# Patient Record
Sex: Female | Born: 1965 | Race: Black or African American | Hispanic: No | Marital: Single | Smoking: Never smoker
Health system: Southern US, Community
[De-identification: ages and names within clinical notes are randomized; demographics above are authoritative.]

## PROBLEM LIST (undated history)

## (undated) DIAGNOSIS — L0293 Carbuncle, unspecified: Secondary | ICD-10-CM

## (undated) DIAGNOSIS — G4730 Sleep apnea, unspecified: Secondary | ICD-10-CM

## (undated) DIAGNOSIS — F329 Major depressive disorder, single episode, unspecified: Secondary | ICD-10-CM

## (undated) DIAGNOSIS — E669 Obesity, unspecified: Secondary | ICD-10-CM

## (undated) DIAGNOSIS — F32A Depression, unspecified: Secondary | ICD-10-CM

## (undated) DIAGNOSIS — R569 Unspecified convulsions: Secondary | ICD-10-CM

## (undated) DIAGNOSIS — T7840XA Allergy, unspecified, initial encounter: Secondary | ICD-10-CM

## (undated) DIAGNOSIS — D259 Leiomyoma of uterus, unspecified: Secondary | ICD-10-CM

## (undated) DIAGNOSIS — Z5189 Encounter for other specified aftercare: Secondary | ICD-10-CM

## (undated) DIAGNOSIS — M810 Age-related osteoporosis without current pathological fracture: Secondary | ICD-10-CM

## (undated) DIAGNOSIS — K219 Gastro-esophageal reflux disease without esophagitis: Secondary | ICD-10-CM

## (undated) DIAGNOSIS — I10 Essential (primary) hypertension: Secondary | ICD-10-CM

## (undated) DIAGNOSIS — E7800 Pure hypercholesterolemia, unspecified: Secondary | ICD-10-CM

## (undated) DIAGNOSIS — L0292 Furuncle, unspecified: Secondary | ICD-10-CM

## (undated) DIAGNOSIS — F419 Anxiety disorder, unspecified: Secondary | ICD-10-CM

## (undated) DIAGNOSIS — J45909 Unspecified asthma, uncomplicated: Secondary | ICD-10-CM

## (undated) DIAGNOSIS — M1990 Unspecified osteoarthritis, unspecified site: Secondary | ICD-10-CM

## (undated) HISTORY — DX: Carbuncle, unspecified: L02.93

## (undated) HISTORY — DX: Gastro-esophageal reflux disease without esophagitis: K21.9

## (undated) HISTORY — DX: Furuncle, unspecified: L02.92

## (undated) HISTORY — PX: KNEE SURGERY: SHX244

## (undated) HISTORY — PX: UPPER GASTROINTESTINAL ENDOSCOPY: SHX188

## (undated) HISTORY — DX: Sleep apnea, unspecified: G47.30

## (undated) HISTORY — PX: ABDOMINAL HYSTERECTOMY: SHX81

## (undated) HISTORY — DX: Allergy, unspecified, initial encounter: T78.40XA

## (undated) HISTORY — DX: Encounter for other specified aftercare: Z51.89

## (undated) HISTORY — DX: Age-related osteoporosis without current pathological fracture: M81.0

---

## 2016-04-09 ENCOUNTER — Encounter (HOSPITAL_COMMUNITY): Payer: Self-pay | Admitting: *Deleted

## 2016-04-09 ENCOUNTER — Emergency Department (HOSPITAL_COMMUNITY)
Admission: EM | Admit: 2016-04-09 | Discharge: 2016-04-09 | Disposition: A | Discharge status: Home / Self Care | Payer: Medicaid Other | Attending: Emergency Medicine | Admitting: Emergency Medicine

## 2016-04-09 DIAGNOSIS — J45909 Unspecified asthma, uncomplicated: Secondary | ICD-10-CM | POA: Diagnosis not present

## 2016-04-09 DIAGNOSIS — I10 Essential (primary) hypertension: Secondary | ICD-10-CM | POA: Insufficient documentation

## 2016-04-09 DIAGNOSIS — N644 Mastodynia: Secondary | ICD-10-CM | POA: Insufficient documentation

## 2016-04-09 DIAGNOSIS — R21 Rash and other nonspecific skin eruption: Secondary | ICD-10-CM | POA: Diagnosis not present

## 2016-04-09 HISTORY — DX: Depression, unspecified: F32.A

## 2016-04-09 HISTORY — DX: Obesity, unspecified: E66.9

## 2016-04-09 HISTORY — DX: Unspecified asthma, uncomplicated: J45.909

## 2016-04-09 HISTORY — DX: Essential (primary) hypertension: I10

## 2016-04-09 HISTORY — DX: Pure hypercholesterolemia, unspecified: E78.00

## 2016-04-09 HISTORY — DX: Anxiety disorder, unspecified: F41.9

## 2016-04-09 HISTORY — DX: Unspecified convulsions: R56.9

## 2016-04-09 HISTORY — DX: Leiomyoma of uterus, unspecified: D25.9

## 2016-04-09 HISTORY — DX: Major depressive disorder, single episode, unspecified: F32.9

## 2016-04-09 HISTORY — DX: Unspecified osteoarthritis, unspecified site: M19.90

## 2016-04-09 NOTE — Discharge Instructions (Signed)
Please call and follow up with the breast center for breast ultrasound.  Call and follow up with dermatologist for your skin rash.  Continue using your triamcinolone cream as needed but avoid using on the face as it can thin your skin.

## 2016-04-09 NOTE — ED Notes (Signed)
Pt ambulated to bathroom without difficulty.

## 2016-04-09 NOTE — ED Triage Notes (Signed)
Pt reports having rash and black patches to her breasts x 3 weeks. Reports pain to both breasts. Also now has rash to her face and arms/hands with mild itching. Reports possible fevers. No acute distress noted at triage.

## 2016-04-09 NOTE — ED Notes (Signed)
Pt stable, ambulatory, states understanding of discharge instructions 

## 2016-04-09 NOTE — ED Provider Notes (Signed)
[facility] DEPT Provider Note   CSN: [id number] Arrival date & time: 04/09/16  1629     History   Chief Complaint Chief Complaint  Patient presents with  . Rash  . Breast Pain    HPI Heather Perry is a 50 y.o. female.  HPI   50 year old obese female with history of asthma, anxiety, hypertension presenting for evaluation of a rash. Patient reports for the past 2 weeks she has had bilateral  breast discomfort as well as rash to her for the past 3 or 4 days it has spread towards her neck and face. No associated nipple discharge, fever, abnormal weight changes. She has been using triamcinolone cream for the past 2 days with minimal improvement. She endorse having congestion, sinus headache which is not new. She recently moved from [place] to here to be with her daughter. Rash is not itchy and nontender. No associated fever, neck stiffness, chest pain, difficulty breathing, numbness or weakness.  Past Medical History:  Diagnosis Date  . Anxiety   . Arthritis   . Asthma   . Depression   . High cholesterol   . Hypertension   . Obesity   . Seizures ([place])   . Uterine fibroid     There are no active problems to display for this patient.   Past Surgical History:  Procedure Laterality Date  . ABDOMINAL HYSTERECTOMY      OB History    No data available       Home Medications    Prior to Admission medications   Not on File    Family History History reviewed. No pertinent family history.  Social History Social History  Substance Use Topics  . Smoking status: Never Smoker  . Smokeless tobacco: Not on file  . Alcohol use No     Allergies   Lortab [hydrocodone-acetaminophen] and Penicillins   Review of Systems Review of Systems  All other systems reviewed and are negative.    Physical Exam Updated Vital Signs BP 132/71 (BP Location: Right Arm)   Pulse 87   Temp 97.5 F (36.4 C) (Oral)   Resp 18   Ht 5\' 2"  (1.575 m)   SpO2 99%   Physical Exam   Constitutional: She appears well-developed and well-nourished. No distress.  Obese female sitting in bed in no acute discomfort.  HENT:  Head: Atraumatic.  Eyes: Conjunctivae are normal.  Neck: Neck supple.  Cardiovascular: Normal rate and regular rhythm.   Pulmonary/Chest: Effort normal and breath sounds normal.  Abdominal: Soft. Bowel sounds are normal. She exhibits no distension. There is no tenderness.  Neurological: She is alert.  Skin: No rash noted.  Chaperone present during exam. On breast exam, no significant mass or nodule appreciated. Patient has several small less than 2 mm hyperpigmented skin lesion noted on her breast that does not appears to be infected and nontender to palpation. The same skin changes lesions noted on her abdomen in no specific distribution. No evidence of cellulitis or abscess noted. Breasts without peau d'orange, and no nipple inversion. No rash on face or neck.  Psychiatric: She has a normal mood and affect.  Nursing note and vitals reviewed.    ED Treatments / Results  Labs (all labs ordered are listed, but only abnormal results are displayed) Labs Reviewed - No data to display  EKG  EKG Interpretation None       Radiology No results found.  Procedures Procedures (including critical care time)  Medications Ordered in ED Medications -  No data to display   Initial Impression / Assessment and Plan / ED Course  I have reviewed the triage vital signs and the nursing notes.  Pertinent labs & imaging results that were available during my care of the patient were reviewed by me and considered in my medical decision making (see chart for details).  Clinical Course    BP 132/71 (BP Location: Right Arm)   Pulse 87   Temp 97.5 F (36.4 C) (Oral)   Resp 18   Ht 5\' 2"  (1.575 m)   SpO2 99%    Final Clinical Impressions(s) / ED Diagnoses   Final diagnoses:  Breast pain  Rash and nonspecific skin eruption    New Prescriptions New  Prescriptions   No medications on file   Patient presents concerning for bilateral breast discomfort for several weeks as well as a non-itchy nontender skin rash to her body. Her breast exam is unremarkable however, plan to refer patient to the breast Center for breast ultrasound for further evaluation. She does have several small localized skin hyperpigmentation skin changes. Suspect old insect bite without evidence of cellulitis or abscess. No evidence to suggest drug eruption, and no concerning feature for rash. Reassurance given. Dermatology referral given as needed. Recommend follow-up with primary care provider for further care.   Domenic Moras, PA-C 04/09/16 1917    Gareth Morgan, MD 04/11/16 1352

## 2016-04-25 ENCOUNTER — Encounter: Payer: Self-pay | Admitting: Gastroenterology

## 2016-04-25 ENCOUNTER — Encounter: Payer: Self-pay | Admitting: Family Medicine

## 2016-04-25 ENCOUNTER — Ambulatory Visit (INDEPENDENT_AMBULATORY_CARE_PROVIDER_SITE_OTHER): Payer: Medicaid Other | Admitting: Family Medicine

## 2016-04-25 VITALS — BP 122/74 | HR 66 | Temp 97.8°F | Resp 14 | Ht 62.0 in | Wt 229.0 lb

## 2016-04-25 DIAGNOSIS — E669 Obesity, unspecified: Secondary | ICD-10-CM

## 2016-04-25 DIAGNOSIS — I10 Essential (primary) hypertension: Secondary | ICD-10-CM | POA: Diagnosis not present

## 2016-04-25 DIAGNOSIS — Z131 Encounter for screening for diabetes mellitus: Secondary | ICD-10-CM

## 2016-04-25 DIAGNOSIS — R21 Rash and other nonspecific skin eruption: Secondary | ICD-10-CM

## 2016-04-25 DIAGNOSIS — Z1211 Encounter for screening for malignant neoplasm of colon: Secondary | ICD-10-CM

## 2016-04-25 LAB — CBC WITH DIFFERENTIAL/PLATELET
BASOS ABS: 0 {cells}/uL (ref 0–200)
Basophils Relative: 0 %
EOS PCT: 2 %
Eosinophils Absolute: 100 cells/uL (ref 15–500)
HCT: 42.2 % (ref 35.0–45.0)
Hemoglobin: 14.1 g/dL (ref 11.7–15.5)
LYMPHS PCT: 35 %
Lymphs Abs: 1750 cells/uL (ref 850–3900)
MCH: 30.5 pg (ref 27.0–33.0)
MCHC: 33.4 g/dL (ref 32.0–36.0)
MCV: 91.1 fL (ref 80.0–100.0)
MONOS PCT: 6 %
MPV: 11.2 fL (ref 7.5–12.5)
Monocytes Absolute: 300 cells/uL (ref 200–950)
NEUTROS PCT: 57 %
Neutro Abs: 2850 cells/uL (ref 1500–7800)
PLATELETS: 178 10*3/uL (ref 140–400)
RBC: 4.63 MIL/uL (ref 3.80–5.10)
RDW: 14.3 % (ref 11.0–15.0)
WBC: 5 10*3/uL (ref 3.8–10.8)

## 2016-04-25 LAB — COMPLETE METABOLIC PANEL WITH GFR
ALBUMIN: 4.6 g/dL (ref 3.6–5.1)
ALK PHOS: 107 U/L (ref 33–130)
ALT: 31 U/L — ABNORMAL HIGH (ref 6–29)
AST: 24 U/L (ref 10–35)
BUN: 20 mg/dL (ref 7–25)
CO2: 26 mmol/L (ref 20–31)
Calcium: 9.7 mg/dL (ref 8.6–10.4)
Chloride: 100 mmol/L (ref 98–110)
Creat: 0.72 mg/dL (ref 0.50–1.05)
GFR, Est African American: >89 mL/min (ref >=60)
GLUCOSE: 104 mg/dL — AB (ref 65–99)
POTASSIUM: 3.9 mmol/L (ref 3.5–5.3)
SODIUM: 137 mmol/L (ref 135–146)
Total Bilirubin: 0.3 mg/dL (ref 0.2–1.2)
Total Protein: 7.5 g/dL (ref 6.1–8.1)

## 2016-04-25 LAB — LIPID PANEL
CHOL/HDL RATIO: 2.2 ratio (ref <5.0)
Cholesterol: 194 mg/dL (ref <200)
HDL: 89 mg/dL (ref >50)
LDL Cholesterol: 85 mg/dL
Triglycerides: 98 mg/dL (ref <150)
VLDL: 20 mg/dL (ref <30)

## 2016-04-25 LAB — HEMOGLOBIN A1C
Hgb A1c MFr Bld: 5.3 % (ref <5.7)
Mean Plasma Glucose: 105 mg/dL

## 2016-04-25 LAB — TSH: TSH: 3.25 m[IU]/L

## 2016-04-25 MED ORDER — PHENYTOIN SODIUM EXTENDED 100 MG PO CAPS: 100.0000 mg | ORAL_CAPSULE | Freq: Three times a day (TID) | ORAL | 5 refills | Status: DC

## 2016-04-25 MED ORDER — ERGOCALCIFEROL 1.25 MG (50000 UT) PO CAPS: 50000.0000 [IU] | ORAL_CAPSULE | ORAL | 1 refills | Status: DC

## 2016-04-25 MED ORDER — TRIAMCINOLONE ACETONIDE 0.1 % EX CREA: 1.0000 "application " | TOPICAL_CREAM | Freq: Two times a day (BID) | CUTANEOUS | 0 refills | Status: DC

## 2016-04-25 MED ORDER — ALBUTEROL SULFATE HFA 108 (90 BASE) MCG/ACT IN AERS: 2.0000 | INHALATION_SPRAY | Freq: Four times a day (QID) | RESPIRATORY_TRACT | 1 refills | Status: DC | PRN

## 2016-04-25 MED ORDER — HYDROCHLOROTHIAZIDE 25 MG PO TABS: 25.0000 mg | ORAL_TABLET | Freq: Every day | ORAL | 1 refills | Status: DC

## 2016-04-25 MED ORDER — POTASSIUM CHLORIDE CRYS ER 20 MEQ PO TBCR: 20.0000 meq | EXTENDED_RELEASE_TABLET | Freq: Two times a day (BID) | ORAL | 5 refills | Status: DC

## 2016-04-25 NOTE — Patient Instructions (Signed)
Bring all the medications that you are taking by for [place] to review.

## 2016-04-25 NOTE — Progress Notes (Signed)
Blood drawn without difficulty, patient tolerated well. Patient would like to wait for Tdap until NP looks into contraindication with seizure history. Patient to bring back bottles of meds for confirmation of what she is taking per Sharon Seller. Patient would like refill of Triamcinolone please.

## 2016-04-25 NOTE — Progress Notes (Signed)
Heather Perry, is a 50 y.o. female  [id number]  [id number]  DOB - 1965-12-26  CC:  Chief Complaint  Patient presents with  . Rash    rash on side of face, neck and back area for 1 month, some itching and irritation  . breast pain    pain in both breast for a few months, like a discomfort last mammogram 1 plus year ago in [place]  . Medication Refill    potassium and HCTZ, ambien  . Hot Flashes    hysterectomy 2017       HPI: Heather Perry is a 50 y.o. female here to establish care. She has a complicated history and is on many medications. Her diagnoses include HTN, asthma, arthritis, hypercholesterolemia, seizures, GERD, anxiety and depression. She brings a medication list, which is extensive and there is a lack of clarity as to what she is actually taking and what she needs refilled. She is to come back in next couple of days and bring her medications for review. She reports needing refills on potassium, Vitamin D, hctz, dilantin and albuterol. She is unsure about the others. She is on oxycodone, valium, ambien and citalopram. We will refer to pain clinic and mental health. She complains today of bilateral breast pain. This has been going on for several lumps. She recently was seen in ED and a breast exam showed no suspicious findings. She also complains of menopausal symptoms. She fairly recently had a hysterectomy but ovaries were reportedly left. She also complains of an intermittent rash and ask for refill of Triamcinolone.   Health Maintenance: she declines Tdap today. Reports having her flu shot for this season. She is due for a mammogram . Has had a hysterectomy for fibroids. She has not had colon cancer screening.    Allergies  Allergen Reactions  . Lortab [Hydrocodone-Acetaminophen]   . Penicillins    Past Medical History:  Diagnosis Date  . Anxiety   . Arthritis   . Asthma   . Depression   . High cholesterol   . Hypertension   . Obesity   . Seizures ([place])    . Uterine fibroid    No current outpatient prescriptions on file prior to visit.   No current facility-administered medications on file prior to visit.    History reviewed. No pertinent family history. Social History   Social History  . Marital status: Single    Spouse name: N/A  . Number of children: N/A  . Years of education: N/A   Occupational History  . Not on file.   Social History Main Topics  . Smoking status: Former Research scientist (life sciences)  . Smokeless tobacco: Never Used  . Alcohol use No  . Drug use: No  . Sexual activity: Not on file   Other Topics Concern  . Not on file   Social History Narrative  . No narrative on file    Review of Systems: Constitutional: + for hot flashes. Skin: + for rash on chest and arms HENT: Negative  Eyes: + for decreased vision Neck: Negative Respiratory: + fpr shortness of breath, coughing, wheezing Cardiovascular: + for palpitation, some swelling of feet and legs. Gastrointestinal: Negative Genitourinary: + for frequency Musculoskeletal: + for low back pain, knee pain  Neurological:+ for seiaure disorder Hematological: Negative  Psychiatric/Behavioral: + for depression and anxiety   Objective:   Vitals:   04/25/16 0814  BP: 122/74  Pulse: 66  Resp: 14  Temp: 97.8 F (36.6 C)    Physical Exam:  Constitutional: Patient appears well-developed and well-nourished. No distress. HENT: Normocephalic, atraumatic, External right and left ear normal. Oropharynx is clear and moist.  Eyes: Conjunctivae and EOM are normal. PERRLA, no scleral icterus. Neck: Normal ROM. Neck supple. No lymphadenopathy, No thyromegaly. CVS: RRR, S1/S2 +, no murmurs, no gallops, no rubs Pulmonary: Effort and breath sounds normal, no stridor, rhonchi, wheezes, rales.  Abdominal: Soft. Normoactive BS,, no distension, tenderness, rebound or guarding.  Musculoskeletal: Normal range of motion. No edema and no tenderness.  Neuro: Alert.Normal muscle tone  coordination. Non-focal Skin: Skin is warm and dry. No rash noted. Not diaphoretic. No erythema. No pallor. Psychiatric: Normal mood and affect. Behavior. Seems a little vague about her conditions and medications  Lab Results  Component Value Date   WBC 5.0 04/25/2016   HGB 14.1 04/25/2016   HCT 42.2 04/25/2016   MCV 91.1 04/25/2016   PLT 178 04/25/2016   Lab Results  Component Value Date   CREATININE 0.72 04/25/2016   BUN 20 04/25/2016   NA 137 04/25/2016   K 3.9 04/25/2016   CL 100 04/25/2016   CO2 26 04/25/2016    Lab Results  Component Value Date   HGBA1C 5.3 04/25/2016   Lipid Panel     Component Value Date/Time   CHOL 194 04/25/2016 0857   TRIG 98 04/25/2016 0857   HDL 89 04/25/2016 0857   CHOLHDL 2.2 04/25/2016 0857   VLDL 20 04/25/2016 0857   LDLCALC 85 04/25/2016 0857        Assessment and plan:   1. Screening for diabetes mellitus  - Hemoglobin A1c  2. Essential hypertension  - CBC with Differential - COMPLETE METABOLIC PANEL WITH GFR - Lipid panel  3. Obesity, unspecified classification, unspecified obesity type, unspecified whether serious comorbidity present  - TSH  4. Special screening for malignant neoplasms, colon  - Ambulatory referral to Gastroenterology  5. Rash and nonspecific skin eruption  - triamcinolone cream (KENALOG) 0.1 %; Apply 1 application topically 2 (two) times daily.  Dispense: 30 g; Refill: 0   Return in about 3 months (around 07/26/2016).  The patient was given clear instructions to go to ER or return to medical center if symptoms don't improve, worsen or new problems develop. The patient verbalized understanding.    Micheline Chapman FNP  04/26/2016, 4:24 PM

## 2016-04-29 ENCOUNTER — Other Ambulatory Visit: Payer: Self-pay | Admitting: Family Medicine

## 2016-04-29 DIAGNOSIS — N644 Mastodynia: Secondary | ICD-10-CM

## 2016-05-04 ENCOUNTER — Other Ambulatory Visit: Payer: Self-pay | Admitting: Family Medicine

## 2016-05-07 ENCOUNTER — Emergency Department (HOSPITAL_COMMUNITY)
Admission: EM | Admit: 2016-05-07 | Discharge: 2016-05-07 | Disposition: A | Discharge status: Home / Self Care | Payer: Medicaid Other | Attending: Dermatology | Admitting: Dermatology

## 2016-05-07 ENCOUNTER — Encounter (HOSPITAL_COMMUNITY): Payer: Self-pay | Admitting: Emergency Medicine

## 2016-05-07 DIAGNOSIS — I10 Essential (primary) hypertension: Secondary | ICD-10-CM | POA: Diagnosis not present

## 2016-05-07 DIAGNOSIS — J45909 Unspecified asthma, uncomplicated: Secondary | ICD-10-CM | POA: Diagnosis not present

## 2016-05-07 DIAGNOSIS — Z5321 Procedure and treatment not carried out due to patient leaving prior to being seen by health care provider: Secondary | ICD-10-CM | POA: Insufficient documentation

## 2016-05-07 DIAGNOSIS — Z7982 Long term (current) use of aspirin: Secondary | ICD-10-CM | POA: Diagnosis not present

## 2016-05-07 DIAGNOSIS — R531 Weakness: Secondary | ICD-10-CM | POA: Diagnosis present

## 2016-05-07 DIAGNOSIS — Z79899 Other long term (current) drug therapy: Secondary | ICD-10-CM | POA: Diagnosis not present

## 2016-05-07 NOTE — ED Notes (Signed)
Called pt multiple times for room assignment with no answer in lobby

## 2016-05-07 NOTE — ED Triage Notes (Addendum)
Weakness, fatigue, yellow discoloration of fingernails, black patches to skin all over. No acute distress. Denies pain. Reports ongoing for two weeks, has seen PCP on 11/6 and had blood work done.

## 2016-05-07 NOTE — ED Notes (Signed)
Pt unhappy with experience and states that she is leaving. Informed charge Roselyn Reef, Therapist, sports. Pt states that multiple people have gone back before her, I explained that pts go to difference areas within department and radiology, triage etc. Pt would not listen to this RN. Pt given patient experience phone #.

## 2016-05-07 NOTE — ED Notes (Signed)
Called pt again multiple times and she is not in lobby

## 2016-05-07 NOTE — ED Notes (Signed)
Pt states that her ride will not come to pick her up until after she is seen by MD so therefore is going to stay but states "she will still be filing a complaint"

## 2016-05-17 ENCOUNTER — Other Ambulatory Visit: Payer: Self-pay | Admitting: Family Medicine

## 2016-05-17 DIAGNOSIS — R21 Rash and other nonspecific skin eruption: Secondary | ICD-10-CM

## 2016-05-18 ENCOUNTER — Other Ambulatory Visit: Payer: Self-pay | Admitting: Family Medicine

## 2016-05-18 ENCOUNTER — Telehealth: Payer: Self-pay

## 2016-05-18 DIAGNOSIS — R21 Rash and other nonspecific skin eruption: Secondary | ICD-10-CM

## 2016-05-18 MED ORDER — BUDESONIDE-FORMOTEROL FUMARATE 160-4.5 MCG/ACT IN AERO: 2.0000 | INHALATION_SPRAY | Freq: Two times a day (BID) | RESPIRATORY_TRACT | 1 refills | Status: DC

## 2016-05-18 MED ORDER — PRAVASTATIN SODIUM 20 MG PO TABS: 20.0000 mg | ORAL_TABLET | Freq: Every day | ORAL | 1 refills | Status: DC

## 2016-05-18 MED ORDER — CITALOPRAM HYDROBROMIDE 40 MG PO TABS: 40.0000 mg | ORAL_TABLET | Freq: Every day | ORAL | 1 refills | Status: DC

## 2016-05-18 MED ORDER — HYDROCHLOROTHIAZIDE 25 MG PO TABS: 25.0000 mg | ORAL_TABLET | Freq: Every day | ORAL | 1 refills | Status: DC

## 2016-05-18 MED ORDER — ESOMEPRAZOLE MAGNESIUM 40 MG PO CPDR: 40.0000 mg | DELAYED_RELEASE_CAPSULE | Freq: Every day | ORAL | 1 refills | Status: DC

## 2016-05-18 MED ORDER — ACYCLOVIR 5 % EX OINT: 1.0000 "application " | TOPICAL_OINTMENT | CUTANEOUS | 1 refills | Status: DC

## 2016-05-18 MED ORDER — PHENYTOIN SODIUM EXTENDED 100 MG PO CAPS: 100.0000 mg | ORAL_CAPSULE | Freq: Three times a day (TID) | ORAL | 5 refills | Status: DC

## 2016-05-18 MED ORDER — VITAMIN B-1 100 MG PO TABS
100.0000 mg | ORAL_TABLET | Freq: Every day | ORAL | 1 refills | Status: DC
Start: 2016-05-18 — End: 2017-06-26

## 2016-05-18 MED ORDER — POTASSIUM CHLORIDE CRYS ER 20 MEQ PO TBCR: 20.0000 meq | EXTENDED_RELEASE_TABLET | Freq: Two times a day (BID) | ORAL | 5 refills | Status: DC

## 2016-05-18 MED ORDER — ERGOCALCIFEROL 1.25 MG (50000 UT) PO CAPS: 50000.0000 [IU] | ORAL_CAPSULE | ORAL | 1 refills | Status: DC

## 2016-05-18 MED ORDER — TRIAMCINOLONE ACETONIDE 0.1 % EX CREA: TOPICAL_CREAM | CUTANEOUS | 0 refills | Status: DC

## 2016-05-18 MED ORDER — ASPIRIN 325 MG PO TABS: 325.0000 mg | ORAL_TABLET | Freq: Every day | ORAL | 1 refills | Status: DC

## 2016-05-18 MED ORDER — GABAPENTIN 300 MG PO CAPS: 300.0000 mg | ORAL_CAPSULE | Freq: Three times a day (TID) | ORAL | 1 refills | Status: DC

## 2016-05-18 MED ORDER — CELECOXIB 200 MG PO CAPS: 200.0000 mg | ORAL_CAPSULE | Freq: Two times a day (BID) | ORAL | 1 refills | Status: DC

## 2016-05-18 MED ORDER — IPRATROPIUM-ALBUTEROL 0.5-2.5 (3) MG/3ML IN SOLN
3.0000 mL | Freq: Four times a day (QID) | RESPIRATORY_TRACT | 1 refills | Status: DC | PRN
Start: 2016-05-18 — End: 2023-06-02

## 2016-05-18 MED ORDER — ALBUTEROL SULFATE HFA 108 (90 BASE) MCG/ACT IN AERS: 2.0000 | INHALATION_SPRAY | Freq: Four times a day (QID) | RESPIRATORY_TRACT | 1 refills | Status: DC | PRN

## 2016-05-18 NOTE — Telephone Encounter (Signed)
Spoke with Jennye Boroughs. She will fax a list of patients refills needed from [facility] in order for patients insurance to pay for them. Thanks!

## 2016-05-19 ENCOUNTER — Ambulatory Visit
Admission: RE | Admit: 2016-05-19 | Discharge: 2016-05-19 | Disposition: A | Discharge status: Home / Self Care | Payer: Medicaid Other | Source: Ambulatory Visit | Attending: Family Medicine | Admitting: Family Medicine

## 2016-05-19 DIAGNOSIS — N644 Mastodynia: Secondary | ICD-10-CM

## 2016-05-24 ENCOUNTER — Other Ambulatory Visit: Payer: Self-pay | Admitting: *Deleted

## 2016-05-24 NOTE — Telephone Encounter (Signed)
Patient verified DOB Patient called to request a refill on Oxycodone. Patient informed of office not prescribing controlled medications and patient will have to obtain medication from a pain clinic. Patient is aware of referral being placed and in the mean time patient was provided the Urgent Care contact information. Patient expressed her understanding and had no further questions at this time.

## 2016-05-30 ENCOUNTER — Other Ambulatory Visit: Payer: Self-pay | Admitting: Family Medicine

## 2016-05-30 MED ORDER — CITALOPRAM HYDROBROMIDE 40 MG PO TABS: 40.0000 mg | ORAL_TABLET | Freq: Every day | ORAL | 1 refills | Status: DC

## 2016-06-08 ENCOUNTER — Ambulatory Visit: Payer: Medicaid Other | Admitting: Family Medicine

## 2016-06-16 ENCOUNTER — Ambulatory Visit (AMBULATORY_SURGERY_CENTER): Payer: Self-pay | Admitting: *Deleted

## 2016-06-16 VITALS — Ht 62.0 in | Wt 221.0 lb

## 2016-06-16 DIAGNOSIS — Z1211 Encounter for screening for malignant neoplasm of colon: Secondary | ICD-10-CM

## 2016-06-16 MED ORDER — NA SULFATE-K SULFATE-MG SULF 17.5-3.13-1.6 GM/177ML PO SOLN: ORAL | 0 refills | Status: DC

## 2016-06-16 NOTE — Progress Notes (Signed)
No egg or soy allergy  No anesthesia or intubation problems per pt  No diet medications taken  No home oxygen used; hx of sleep apnea- no CPAP used  Pt has hx of seizures- last one "three years ago" per pt.  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

## 2016-06-30 ENCOUNTER — Encounter: Payer: Self-pay | Admitting: Gastroenterology

## 2016-06-30 ENCOUNTER — Ambulatory Visit (AMBULATORY_SURGERY_CENTER): Payer: Medicaid Other | Admitting: Gastroenterology

## 2016-06-30 VITALS — BP 124/88 | HR 65 | Temp 96.9°F | Resp 15 | Ht 62.0 in | Wt 221.0 lb

## 2016-06-30 DIAGNOSIS — Z1212 Encounter for screening for malignant neoplasm of rectum: Secondary | ICD-10-CM | POA: Diagnosis not present

## 2016-06-30 DIAGNOSIS — Z1211 Encounter for screening for malignant neoplasm of colon: Secondary | ICD-10-CM | POA: Diagnosis not present

## 2016-06-30 MED ORDER — SODIUM CHLORIDE 0.9 % IV SOLN: 500.0000 mL | INTRAVENOUS | Status: DC

## 2016-06-30 NOTE — Op Note (Addendum)
[facility] Patient Name: Heather Perry Procedure Date: 06/30/2016 11:11 AM MRN: [id number] Endoscopist: Mauri Pole , MD Age: 51 Referring MD:  Date of Birth: 1966-03-15 Gender: Female Account #: [id number] Procedure:                Colonoscopy Indications:              Screening for colorectal malignant neoplasm, This                            is the patient's first colonoscopy Medicines:                Monitored Anesthesia Care Procedure:                Pre-Anesthesia Assessment:                           - Prior to the procedure, a History and Physical                            was performed, and patient medications and                            allergies were reviewed. The patient's tolerance of                            previous anesthesia was also reviewed. The risks                            and benefits of the procedure and the sedation                            options and risks were discussed with the patient.                            All questions were answered, and informed consent                            was obtained. Prior Anticoagulants: The patient has                            taken no previous anticoagulant or antiplatelet                            agents. ASA Grade Assessment: II - A patient with                            mild systemic disease. After reviewing the risks                            and benefits, the patient was deemed in                            satisfactory condition to undergo the procedure.  After obtaining informed consent, the colonoscope                            was passed under direct vision. Throughout the                            procedure, the patient's blood pressure, pulse, and                            oxygen saturations were monitored continuously. The                            Colonoscope was introduced through the anus and                            advanced to the the  terminal ileum, with                            identification of the appendiceal orifice and IC                            valve. The colonoscopy was somewhat difficult due                            to restricted mobility of the colon. Successful                            completion of the procedure was aided by applying                            abdominal pressure. The patient tolerated the                            procedure well. The quality of the bowel                            preparation was good. The terminal ileum, ileocecal                            valve, appendiceal orifice, and rectum were                            photographed. Scope In: 11:16:53 AM Scope Out: [id number] AM Scope Withdrawal Time: 0 hours 9 minutes 11 seconds  Total Procedure Duration: 0 hours 20 minutes 6 seconds  Findings:                 The perianal and digital rectal examinations were                            normal.                           Non-bleeding internal hemorrhoids were found during  retroflexion. The hemorrhoids were small.                           The exam was otherwise without abnormality. Complications:            No immediate complications. Estimated Blood Loss:     Estimated blood loss: none. Impression:               - Non-bleeding internal hemorrhoids.                           - The examination was otherwise normal.                           - No specimens collected. Recommendation:           - Patient has a contact number available for                            emergencies. The signs and symptoms of potential                            delayed complications were discussed with the                            patient. Return to normal activities tomorrow.                            Written discharge instructions were provided to the                            patient.                           - Resume previous diet.                           -  Continue present medications.                           - Repeat colonoscopy in 10 years for screening                            purposes.                           - Return to GI clinic PRN. Mauri Pole, MD 06/30/2016 11:41:58 AM This report has been signed electronically.

## 2016-06-30 NOTE — Progress Notes (Signed)
Report to PACU, RN, vss, BBS= Clear.  

## 2016-06-30 NOTE — Patient Instructions (Signed)
YOU HAD AN ENDOSCOPIC PROCEDURE TODAY AT [facility]:   Refer to the procedure report that was given to you for any specific questions about what was found during the examination.  If the procedure report does not answer your questions, please call your gastroenterologist to clarify.  If you requested that your care partner not be given the details of your procedure findings, then the procedure report has been included in a sealed envelope for you to review at your convenience later.  YOU SHOULD EXPECT: Some feelings of bloating in the abdomen. Passage of more gas than usual.  Walking can help get rid of the air that was put into your GI tract during the procedure and reduce the bloating. If you had a lower endoscopy (such as a colonoscopy or flexible sigmoidoscopy) you may notice spotting of blood in your stool or on the toilet paper. If you underwent a bowel prep for your procedure, you may not have a normal bowel movement for a few days.  Please Note:  You might notice some irritation and congestion in your nose or some drainage.  This is from the oxygen used during your procedure.  There is no need for concern and it should clear up in a day or so.  SYMPTOMS TO REPORT IMMEDIATELY:   Following lower endoscopy (colonoscopy or flexible sigmoidoscopy):  Excessive amounts of blood in the stool  Significant tenderness or worsening of abdominal pains  Swelling of the abdomen that is new, acute  Fever of 100F or higher  For urgent or emergent issues, a gastroenterologist can be reached at any hour by calling [phone].   DIET:  We do recommend a small meal at first, but then you may proceed to your regular diet.  Drink plenty of fluids but you should avoid alcoholic beverages for 24 hours.  ACTIVITY:  You should plan to take it easy for the rest of today and you should NOT DRIVE or use heavy machinery until tomorrow (because of the sedation medicines used during the test).     FOLLOW UP: Our staff will call the number listed on your records the next business day following your procedure to check on you and address any questions or concerns that you may have regarding the information given to you following your procedure. If we do not reach you, we will leave a message.  However, if you are feeling well and you are not experiencing any problems, there is no need to return our call.  We will assume that you have returned to your regular daily activities without incident.  Repeat Colonoscopy in 10 years Hemorrhoids (handout given)  If any biopsies were taken you will be contacted by phone or by letter within the next 1-3 weeks.  Please call us at [phone] if you have not heard about the biopsies in 3 weeks.    SIGNATURES/CONFIDENTIALITY: You and/or your care partner have signed paperwork which will be entered into your electronic medical record.  These signatures attest to the fact that that the information above on your After Visit Summary has been reviewed and is understood.  Full responsibility of the confidentiality of this discharge information lies with you and/or your care-partner.

## 2016-07-01 ENCOUNTER — Telehealth: Payer: Self-pay

## 2016-07-01 NOTE — Telephone Encounter (Signed)
  Follow up Call-  Call back number 06/30/2016  Post procedure Call Back phone  # [phone]  Permission to leave phone message No     Patient questions:  Do you have a fever, pain , or abdominal swelling? No. Pain Score  0 *  Have you tolerated food without any problems? Yes.    Have you been able to return to your normal activities? Yes.    Do you have any questions about your discharge instructions: Diet   No. Medications  No. Follow up visit  No.  Do you have questions or concerns about your Care? No.  Actions: * If pain score is 4 or above: No action needed, pain <4.

## 2016-07-05 ENCOUNTER — Other Ambulatory Visit: Payer: Self-pay | Admitting: Family Medicine

## 2016-07-05 DIAGNOSIS — R21 Rash and other nonspecific skin eruption: Secondary | ICD-10-CM

## 2016-07-12 ENCOUNTER — Ambulatory Visit: Payer: Medicaid Other | Admitting: Family Medicine

## 2016-08-24 ENCOUNTER — Other Ambulatory Visit: Payer: Self-pay | Admitting: Family Medicine

## 2016-08-24 DIAGNOSIS — R21 Rash and other nonspecific skin eruption: Secondary | ICD-10-CM

## 2016-09-09 ENCOUNTER — Other Ambulatory Visit: Payer: Self-pay | Admitting: Family Medicine

## 2016-10-04 ENCOUNTER — Ambulatory Visit (INDEPENDENT_AMBULATORY_CARE_PROVIDER_SITE_OTHER): Payer: Medicaid Other | Admitting: Physician Assistant

## 2016-10-11 ENCOUNTER — Ambulatory Visit (INDEPENDENT_AMBULATORY_CARE_PROVIDER_SITE_OTHER): Payer: Medicaid Other | Admitting: Physician Assistant

## 2016-10-11 ENCOUNTER — Encounter (INDEPENDENT_AMBULATORY_CARE_PROVIDER_SITE_OTHER): Payer: Self-pay

## 2016-10-18 ENCOUNTER — Encounter (INDEPENDENT_AMBULATORY_CARE_PROVIDER_SITE_OTHER): Payer: Self-pay

## 2016-10-18 ENCOUNTER — Ambulatory Visit (INDEPENDENT_AMBULATORY_CARE_PROVIDER_SITE_OTHER): Payer: Medicaid Other | Admitting: Physician Assistant

## 2016-11-24 ENCOUNTER — Ambulatory Visit: Payer: Medicaid Other | Admitting: Family Medicine

## 2016-12-12 ENCOUNTER — Encounter (INDEPENDENT_AMBULATORY_CARE_PROVIDER_SITE_OTHER): Payer: Self-pay | Admitting: Physician Assistant

## 2016-12-12 ENCOUNTER — Telehealth (INDEPENDENT_AMBULATORY_CARE_PROVIDER_SITE_OTHER): Payer: Self-pay | Admitting: Physician Assistant

## 2016-12-12 ENCOUNTER — Ambulatory Visit (INDEPENDENT_AMBULATORY_CARE_PROVIDER_SITE_OTHER): Payer: Medicaid Other | Admitting: Physician Assistant

## 2016-12-12 VITALS — BP 117/69 | HR 76 | Temp 97.8°F | Ht 63.5 in | Wt 216.6 lb

## 2016-12-12 DIAGNOSIS — L0202 Furuncle of face: Secondary | ICD-10-CM

## 2016-12-12 DIAGNOSIS — G8929 Other chronic pain: Secondary | ICD-10-CM | POA: Diagnosis not present

## 2016-12-12 DIAGNOSIS — M545 Low back pain, unspecified: Secondary | ICD-10-CM

## 2016-12-12 DIAGNOSIS — N611 Abscess of the breast and nipple: Secondary | ICD-10-CM | POA: Diagnosis not present

## 2016-12-12 MED ORDER — CHLORHEXIDINE GLUCONATE 4 % EX LIQD: Freq: Every day | CUTANEOUS | 0 refills | Status: DC | PRN

## 2016-12-12 MED ORDER — SULFAMETHOXAZOLE-TRIMETHOPRIM 800-160 MG PO TABS: 1.0000 | ORAL_TABLET | Freq: Two times a day (BID) | ORAL | 0 refills | Status: DC

## 2016-12-12 NOTE — Patient Instructions (Signed)
Staphylococcal Infection Staphylococcus aureus, also known as staph, is a bacteria that can cause infections. The most common type of staph infection is a skin infection. Staph can spread easily from one person to another. How is this diagnosed? Your health care provider may diagnose and treat a staph infection based on an exam. He or she may also do a culture from the affected area:  To find out exactly what type of bacteria is causing your infection.  To decide which medicine is best to treat it.  How is this treated? Most staph infections can be easily treated with antibiotic medicine. Some infections must be drained. More serious types of staph, including methicillin-resistant Staphylococcus aureus (MRSA) may be more difficult to treat. They may require a strong antibiotic or more than one antibiotic. Follow these instructions at home:  Take your antibiotic as directed by your health care provider. Finish your antibiotic even if you start to feel better.  Keep all follow-up visits as directed by your health care provider.  Tell all of your health care providers including dentists that you have a staph infection, especially if you have been diagnosed with MRSA.  Ask your health care provider when it is safe for you to return to school or work.  To prevent spreading the infection: ? Keep infections and pus or drainage material covered with clean, dry bandages (dressings), or as directed by your health care provider. ? Wash your hands with soap and water often, especially after changing dressings or touching your infection. ? Advise your family and other close contacts to wash their hands frequently with soap and water. They should always wash their hands after changing your dressings, touching the infected area, or touching infected materials. ? Avoid sharing personal items that may have had contact with your infection. These include towels, washcloths, razors, clothing, and  uniforms. ? Wash your linens and clothes with hot water and laundry detergent. Drying clothes in a hot dryer-rather than air-drying-also helps to kill bacteria in clothes. Contact a health care provider if: Although they are usually easy to treat, some staph infections can become very serious. You should contact your health care provider if your infection gets worse or if you have a fever. This information is not intended to replace advice given to you by your health care provider. Make sure you discuss any questions you have with your health care provider. Document Released: 06/27/2014 Document Revised: 11/12/2015 Document Reviewed: 03/11/2014 Elsevier Interactive Patient Education  [employer].

## 2016-12-12 NOTE — Telephone Encounter (Signed)
Patient called stated she has the same spots/rash on her stomach. Stated Heather Fail PA saw the spots/rash this morning but she did not show him the spots on her stomach. Patient thinks it is bug bits but would like to know what Heather Fail PA thinks regarding spots on stomach.  Please follow up with patient.

## 2016-12-12 NOTE — Progress Notes (Signed)
Subjective:  Patient ID: Heather Perry, female    DOB: Aug 29, 1965  Age: 51 y.o. MRN: [id number]  CC: breakouts on face and body  HPI Heather Perry is a 51 y.o. female with a PMH of Anxiety, Depression, HTN, Asthma, OSA, Seizures, Osteoporosis, Obesity, and GERD presents with concern of dark spots on body to include the torso, under breasts, arms, and right cheek. Lesion on right cheek is the most recent. Says the lesion self drained its pustular contents. Other lesions on body have dried out and are no longer swollen, red, or painful. However, there has remained a dark spot in the area of previous inflammation. Requests a dermatology referral.     Patient complains of LBP pain for approximately 4-5  years. Says she has a disc pushing on nerves. Previously went to pain clinic and would like to go back. Says pain is so severe that she can not get out of bed or from chair. Pain is nonradiating and currently a 5/10 because she is taking Roxicet. Has been receiving medications from her PCP in [place]. Does not have any records for [place] to review but says she can bring records or have them faxed to [place] from [place]. Does not endorse saddle paresthesia, urinary dysfunction, or bowel dysfunction.      Outpatient Medications Prior to Visit  Medication Sig Dispense Refill  . albuterol (PROVENTIL HFA;VENTOLIN HFA) 108 (90 Base) MCG/ACT inhaler Inhale 2 puffs into the lungs every 6 (six) hours as needed for wheezing or shortness of breath. 1 Inhaler 1  . aspirin 325 MG EC tablet TAKE 1 TABLET (325 MG TOTAL) BY MOUTH DAILY. 90 tablet 1  . budesonide-formoterol (SYMBICORT) 160-4.5 MCG/ACT inhaler Inhale 2 puffs into the lungs 2 (two) times daily. 1 Inhaler 1  . celecoxib (CELEBREX) 200 MG capsule Take 1 capsule (200 mg total) by mouth 2 (two) times daily. 90 capsule 1  . citalopram (CELEXA) 40 MG tablet Take 1 tablet (40 mg total) by mouth daily. 90 tablet 1  . diazepam (VALIUM) 5 MG tablet Take 5 mg by mouth  every 6 (six) hours as needed for anxiety.    . ergocalciferol (VITAMIN D2) 50000 units capsule Take 1 capsule (50,000 Units total) by mouth once a week. 12 capsule 1  . esomeprazole (NEXIUM) 40 MG capsule Take 1 capsule (40 mg total) by mouth daily at 12 noon. 90 capsule 1  . hydrochlorothiazide (HYDRODIURIL) 25 MG tablet Take 1 tablet (25 mg total) by mouth daily. 90 tablet 1  . ipratropium-albuterol (DUONEB) 0.5-2.5 (3) MG/3ML SOLN Take 3 mLs by nebulization every 6 (six) hours as needed. 360 mL 1  . oxyCODONE (ROXICODONE) 15 MG immediate release tablet Take 15 mg by mouth every 4 (four) hours as needed for pain.    . phenytoin (DILANTIN) 100 MG ER capsule Take 1 capsule (100 mg total) by mouth 3 (three) times daily. 90 capsule 5  . potassium chloride SA (K-DUR,KLOR-CON) 20 MEQ tablet Take 1 tablet (20 mEq total) by mouth 2 (two) times daily. 30 tablet 5  . pravastatin (PRAVACHOL) 20 MG tablet Take 1 tablet (20 mg total) by mouth daily. 90 tablet 1  . thiamine (VITAMIN B-1) 100 MG tablet Take 1 tablet (100 mg total) by mouth daily. 90 tablet 1  . triamcinolone cream (KENALOG) 0.1 % APPLY 1 APPLICATION TOPICALLY 2 TIMES DAILY 30 g 0  . acyclovir ointment (ZOVIRAX) 5 % Apply 1 application topically every 3 (three) hours. (Patient not taking: Reported on 12/12/2016)  15 g 1  . cyclobenzaprine (FLEXERIL) 10 MG tablet Take 10 mg by mouth 3 (three) times daily as needed for muscle spasms.    Marland Kitchen gabapentin (NEURONTIN) 300 MG capsule Take 1 capsule (300 mg total) by mouth 3 (three) times daily. (Patient not taking: Reported on 12/12/2016) 270 capsule 1  . thiamine 100 MG tablet Take 100 mg by mouth daily.    Marland Kitchen zolpidem (AMBIEN) 10 MG tablet Take 10 mg by mouth at bedtime as needed for sleep.     Facility-Administered Medications Prior to Visit  Medication Dose Route Frequency Provider Last Rate Last Dose  . 0.9 %  sodium chloride infusion  500 mL Intravenous Continuous Nandigam, Kavitha V, MD          ROS Review of Systems  Constitutional: Negative for chills, fever and malaise/fatigue.  Eyes: Negative for blurred vision.  Respiratory: Negative for shortness of breath.   Cardiovascular: Negative for chest pain and palpitations.  Gastrointestinal: Negative for abdominal pain and nausea.  Genitourinary: Negative for dysuria and hematuria.  Musculoskeletal: Positive for back pain. Negative for joint pain and myalgias.  Skin: Positive for rash.  Neurological: Negative for tingling and headaches.  Psychiatric/Behavioral: Negative for depression. The patient is not nervous/anxious.     Objective:  BP 117/69 (BP Location: Left Arm, Patient Position: Sitting, Cuff Size: Large)   Pulse 76   Temp 97.8 F (36.6 C) (Oral)   Ht 5' 3.5" (1.613 m)   Wt 216 lb 9.6 oz (98.2 kg)   SpO2 99%   BMI 37.77 kg/m   BP/Weight 12/12/2016 06/30/2016 69/67/8938  Systolic BP 101 751 -  Diastolic BP 69 88 -  Wt. (Lbs) 216.6 221 221  BMI 37.77 40.42 40.42      Physical Exam  Constitutional: She is oriented to person, place, and time.  Well developed, obese, NAD, mild slurring of speech  HENT:  Head: Normocephalic and atraumatic.  Eyes: No scleral icterus.  Neck: Normal range of motion. Neck supple.  Cardiovascular: Normal rate, regular rhythm and normal heart sounds.   Pulmonary/Chest: Effort normal and breath sounds normal.  Musculoskeletal: She exhibits no edema.  Mild TTP along the L3-L5 region including the paraspinals.  Neurological: She is alert and oriented to person, place, and time. No cranial nerve deficit. Coordination normal.  Skin: Skin is warm and dry.  Lesions of postinflammatory hyperpigmentation of the right cheek, lower breasts, and upper arms bilaterally  Psychiatric: She has a normal mood and affect. Her behavior is normal. Thought content normal.  Vitals reviewed.    Assessment & Plan:   1. Furuncle of breast - Postinflammatory hyperpigmentation most likely from  staph infection - Begin sulfamethoxazole-trimethoprim (BACTRIM DS) 800-160 MG tablet; Take 1 tablet by mouth 2 (two) times daily.  Dispense: 20 tablet; Refill: 0 - Begin Hibiclens antiseptic wash. Use on body before a shower and wash with antibacterial soap.  2. Furuncle of cheek - Postinflammatory hyperpigmentation most likely from staph infection - Begin sulfamethoxazole-trimethoprim (BACTRIM DS) 800-160 MG tablet; Take 1 tablet by mouth 2 (two) times daily.  Dispense: 20 tablet; Refill: 0 - Begin Hibiclens antiseptic wash. Use on body before a shower and wash with antibacterial soap.  3. Chronic midline low back pain without sciatica - Ambulatory referral to Pain Clinic - DG Lumbar Spine Complete; Future - Patient currently on Roxicet.  Meds ordered this encounter  Medications  . sulfamethoxazole-trimethoprim (BACTRIM DS) 800-160 MG tablet    Sig: Take 1 tablet by mouth  2 (two) times daily.    Dispense:  20 tablet    Refill:  0    Order Specific Question:   Supervising Provider    Answer:   Tresa Garter [id number]  . chlorhexidine (HIBICLENS) 4 % external liquid    Sig: Apply topically daily as needed.    Dispense:  120 mL    Refill:  0    Order Specific Question:   Supervising Provider    Answer:   Tresa Garter [[id number]]    Follow-up: Return in about 4 weeks (around 01/09/2017) for full physical.   Clent Demark PA

## 2016-12-13 NOTE — Telephone Encounter (Signed)
PATIENT INFORMED TO TAKE MEDICATION GIVEN TO HER ON YESTERDAY FOR HER SPOTS. Nat Christen, CMA

## 2016-12-14 ENCOUNTER — Telehealth (INDEPENDENT_AMBULATORY_CARE_PROVIDER_SITE_OTHER): Payer: Self-pay | Admitting: Physician Assistant

## 2016-12-14 NOTE — Telephone Encounter (Signed)
Patient called left voicemail stated was seen yesterday and would like to know if rash is contagious, concerned because she has a baby in her house.  Please follow up with patient.

## 2016-12-15 NOTE — Telephone Encounter (Signed)
Informed patient that per PCP she is not contagious, but he did state for patient to bathe really well and everything will be fine. Nat Christen, CMA

## 2016-12-16 ENCOUNTER — Ambulatory Visit (HOSPITAL_COMMUNITY)
Admission: RE | Admit: 2016-12-16 | Discharge: 2016-12-16 | Disposition: A | Discharge status: Home / Self Care | Payer: Medicaid Other | Source: Ambulatory Visit | Attending: Physician Assistant | Admitting: Physician Assistant

## 2016-12-16 DIAGNOSIS — M47816 Spondylosis without myelopathy or radiculopathy, lumbar region: Secondary | ICD-10-CM | POA: Diagnosis not present

## 2016-12-16 DIAGNOSIS — M545 Low back pain: Secondary | ICD-10-CM | POA: Insufficient documentation

## 2016-12-16 DIAGNOSIS — G8929 Other chronic pain: Secondary | ICD-10-CM | POA: Diagnosis present

## 2016-12-19 ENCOUNTER — Telehealth (INDEPENDENT_AMBULATORY_CARE_PROVIDER_SITE_OTHER): Payer: Self-pay | Admitting: Physician Assistant

## 2016-12-19 NOTE — Telephone Encounter (Signed)
Patient left voicemail would like to get x-ray results.  Please follow up with patient.

## 2016-12-20 ENCOUNTER — Other Ambulatory Visit (INDEPENDENT_AMBULATORY_CARE_PROVIDER_SITE_OTHER): Payer: Self-pay | Admitting: Physician Assistant

## 2016-12-20 DIAGNOSIS — M48061 Spinal stenosis, lumbar region without neurogenic claudication: Secondary | ICD-10-CM

## 2016-12-20 NOTE — Telephone Encounter (Signed)
Patient called left voicemail stated would like a call back regarding her x-ray results.  Please follow up with patient.

## 2016-12-22 NOTE — Telephone Encounter (Signed)
FWD to PCP. Tempestt S Roberts, CMA  

## 2016-12-23 NOTE — Telephone Encounter (Signed)
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 She should have a non-emergent MRI to further assess. I will send an orthopedic referral now.

## 2016-12-23 NOTE — Telephone Encounter (Signed)
Patient has already been notified of results. Nat Christen, CMA

## 2016-12-28 ENCOUNTER — Telehealth (INDEPENDENT_AMBULATORY_CARE_PROVIDER_SITE_OTHER): Payer: Self-pay | Admitting: Physician Assistant

## 2016-12-28 NOTE — Telephone Encounter (Signed)
Pt called requesting xray results to be mailed to her if possible.

## 2016-12-28 NOTE — Telephone Encounter (Signed)
MAILED RESULTS. Nat Christen, CMA

## 2017-01-05 ENCOUNTER — Telehealth (INDEPENDENT_AMBULATORY_CARE_PROVIDER_SITE_OTHER): Payer: Self-pay | Admitting: Physician Assistant

## 2017-01-05 NOTE — Telephone Encounter (Signed)
Sorry, that is not feasible or medically warranted.

## 2017-01-05 NOTE — Telephone Encounter (Signed)
Patient called stated wants MRI of whole body not just back.  Please follow up with patient.

## 2017-01-05 NOTE — Telephone Encounter (Signed)
FWD to PCP. Tempestt S Roberts, CMA  

## 2017-01-06 ENCOUNTER — Other Ambulatory Visit (HOSPITAL_COMMUNITY): Payer: Self-pay | Admitting: Nurse Practitioner

## 2017-01-06 ENCOUNTER — Ambulatory Visit (HOSPITAL_COMMUNITY)
Admission: RE | Admit: 2017-01-06 | Discharge: 2017-01-06 | Disposition: A | Discharge status: Home / Self Care | Payer: Medicaid Other | Source: Ambulatory Visit | Attending: Nurse Practitioner | Admitting: Nurse Practitioner

## 2017-01-06 DIAGNOSIS — J45909 Unspecified asthma, uncomplicated: Secondary | ICD-10-CM | POA: Diagnosis not present

## 2017-01-11 ENCOUNTER — Encounter (INDEPENDENT_AMBULATORY_CARE_PROVIDER_SITE_OTHER): Payer: Self-pay | Admitting: Physician Assistant

## 2017-01-11 ENCOUNTER — Ambulatory Visit (INDEPENDENT_AMBULATORY_CARE_PROVIDER_SITE_OTHER): Payer: Medicaid Other | Admitting: Physician Assistant

## 2017-01-11 VITALS — BP 106/70 | HR 80 | Temp 97.9°F | Ht 63.39 in | Wt 216.8 lb

## 2017-01-11 DIAGNOSIS — M2550 Pain in unspecified joint: Secondary | ICD-10-CM | POA: Diagnosis not present

## 2017-01-11 DIAGNOSIS — M545 Low back pain: Secondary | ICD-10-CM | POA: Diagnosis not present

## 2017-01-11 DIAGNOSIS — N390 Urinary tract infection, site not specified: Secondary | ICD-10-CM | POA: Diagnosis not present

## 2017-01-11 DIAGNOSIS — M791 Myalgia, unspecified site: Secondary | ICD-10-CM

## 2017-01-11 DIAGNOSIS — R8281 Pyuria: Secondary | ICD-10-CM

## 2017-01-11 DIAGNOSIS — Z1159 Encounter for screening for other viral diseases: Secondary | ICD-10-CM | POA: Diagnosis not present

## 2017-01-11 DIAGNOSIS — G8929 Other chronic pain: Secondary | ICD-10-CM

## 2017-01-11 DIAGNOSIS — Z0000 Encounter for general adult medical examination without abnormal findings: Secondary | ICD-10-CM

## 2017-01-11 DIAGNOSIS — L989 Disorder of the skin and subcutaneous tissue, unspecified: Secondary | ICD-10-CM | POA: Diagnosis not present

## 2017-01-11 DIAGNOSIS — Z23 Encounter for immunization: Secondary | ICD-10-CM | POA: Diagnosis not present

## 2017-01-11 DIAGNOSIS — Z114 Encounter for screening for human immunodeficiency virus [HIV]: Secondary | ICD-10-CM

## 2017-01-11 LAB — POCT URINALYSIS DIPSTICK
BILIRUBIN UA: NEGATIVE
Blood, UA: NEGATIVE
GLUCOSE UA: NEGATIVE
Ketones, UA: NEGATIVE
Nitrite, UA: NEGATIVE
PH UA: 6 (ref 5.0–8.0)
Protein, UA: NEGATIVE
Spec Grav, UA: 1.02 (ref 1.010–1.025)
UROBILINOGEN UA: 0.2 U/dL

## 2017-01-11 NOTE — Progress Notes (Signed)
  Subjective:  Patient ID: Heather Perry, female    DOB: [birth year]  Age: 51 y.o. MRN: [id number]  CC: annual exam  HPI  Heather Perry is a 51 y.o. female with a PMH of Anxiety, Depression, HTN, Asthma, OSA, Seizures, Osteoporosis, Obesity, and GERD presents for an annual physical. Continues with pain in the "whole body". Requests an MRI of the whole body. Has skin lesions that persist but has less outbreaks since using chlorhexidine. Back pain is the same and Roxicet has become ineffective. Currently in process of getting into pain clinic. Does not endorse CP, palpitations, SOB, HA, abdominal pain, f/c/n/v, saddle paresthesia, urinary incontinence, fecal incontinence, or GU sxs.    ROS Review of Systems  Constitutional: Negative for chills, fever and malaise/fatigue.  Eyes: Negative for blurred vision.  Respiratory: Negative for shortness of breath.   Cardiovascular: Negative for chest pain and palpitations.  Gastrointestinal: Negative for abdominal pain and nausea.  Genitourinary: Negative for dysuria and hematuria.  Musculoskeletal: Positive for back pain, joint pain and myalgias.  Skin: Positive for rash.  Neurological: Negative for tingling and headaches.  Psychiatric/Behavioral: Negative for depression. The patient is not nervous/anxious.     Objective:  BP 106/70 (BP Location: Left Arm, Patient Position: Sitting, Cuff Size: Large)   Pulse 80   Temp 97.9 F (36.6 C) (Oral)   Ht 5' 3.39" (1.61 m)   Wt 216 lb 12.8 oz (98.3 kg)   SpO2 97%   BMI 37.94 kg/m   BP/Weight 01/11/2017 12/12/2016 6/72/0947  Systolic BP 096 283 662  Diastolic BP 70 69 88  Wt. (Lbs) 216.8 216.6 221  BMI 37.94 37.77 40.42      Physical Exam  Constitutional: She is oriented to person, place, and time.  Well developed, obese, NAD, polite  HENT:  Head: Normocephalic and atraumatic.  Mouth/Throat: No oropharyngeal exudate.  Eyes: Pupils are equal, round, and reactive to light. Conjunctivae and EOM  are normal. No scleral icterus.  Neck: Normal range of motion. Neck supple. No thyromegaly present.  Cardiovascular: Normal rate, regular rhythm and normal heart sounds.   Pulmonary/Chest: Effort normal and breath sounds normal.  Abdominal: Soft. Bowel sounds are normal. She exhibits no distension. There is no tenderness. There is no rebound and no guarding.  Difficult to assess for hepatosplenomegaly due to obese abdomen  Musculoskeletal: She exhibits no edema.  Back ROM not assessed due to pain. UEs and LEs with full aROM.  Lymphadenopathy:    She has no cervical adenopathy.  Neurological: She is alert and oriented to person, place, and time. No cranial nerve deficit. Coordination normal.  Patellar DTR 1+ bilaterally. LE and UE strength 5/5 bilaterally  Skin: Skin is warm and dry. No rash noted. No erythema. No pallor.  Psychiatric: She has a normal mood and affect. Thought content normal.  Odd mannerisms  Vitals reviewed.    Assessment & Plan:   1. Annual physical exam - CBC with Differential - Comprehensive metabolic panel - Lipid Panel - Urinalysis Dipstick with trace leukocytes in clinic today  2. Screening for HIV without presence of risk factors - HIV antibody  3. Need for hepatitis C screening test - Hepatitis C Antibody  4. Need for Tdap vaccination - Administered Tdap in clinic today  5. Myalgia - ANA w/Reflex  6. Arthralgia, unspecified joint - ANA w/Reflex     Follow-up: 8 weeks  Clent Demark PA

## 2017-01-11 NOTE — Patient Instructions (Signed)
Spinal Stenosis Spinal stenosis occurs when the open space (spinal canal) between the bones of your spine (vertebrae) narrows, putting pressure on the spinal cord or nerves. What are the causes? This condition is caused by areas of bone pushing into the central canals of your vertebrae. This condition may be present at birth (congenital), or it may be caused by:  Arthritic deterioration of your vertebrae (spinal degeneration). This usually starts around age 25.  Injury or trauma to the spine.  Tumors in the spine.  Calcium deposits in the spine.  What are the signs or symptoms? Symptoms of this condition include:  Pain in the neck or back that is generally worse with activities, particularly when standing and walking.  Numbness, tingling, hot or cold sensations, weakness, or weariness in your legs.  Pain going up and down the leg (sciatica).  Frequent episodes of falling.  A foot-slapping gait that leads to muscle weakness.  In more serious cases, you may develop:  Problemspassing stool or passing urine.  Difficulty having sex.  Loss of feeling in part or all of your leg.  Symptoms may come on slowly and get worse over time. How is this diagnosed? This condition is diagnosed based on your medical history and a physical exam. Tests will also be done, such as:  MRI.  CT scan.  X-ray.  How is this treated? Treatment for this condition often focuses on managing your pain and any other symptoms. Treatment may include:  Practicing good posture to lessen pressure on your nerves.  Exercising to strengthen muscles, build endurance, improve balance, and maintain good joint movement (range of motion).  Losing weight, if needed.  Taking medicines to reduce swelling, inflammation, or pain.  Assistive devices, such as a corset or brace.  In some cases, surgery may be needed. The most common procedure is decompression laminectomy. This is done to remove excess bone that  puts pressure on your nerve roots. Follow these instructions at home: Managing pain, stiffness, and swelling  Do all exercises and stretches as told by your health care provider.  Practice good posture. If you were given a brace or a corset, wear it as told by your health care provider.  Do not do any activities that cause pain. Ask your health care provider what activities are safe for you.  Do not lift anything that is heavier than 10 lb (4.5 kg) or the limit that your health care provider tells you.  Maintain a healthy weight. Talk with your health care provider if you need help losing weight.  If directed, apply heat to the affected area as often as told by your health care provider. Use the heat source that your health care provider recommends, such as a moist heat pack or a heating pad. ? Place a towel between your skin and the heat source. ? Leave the heat on for 20-30 minutes. ? Remove the heat if your skin turns bright red. This is especially important if you are not able to feel pain, heat, or cold. You may have a greater risk of getting burned. General instructions  Take over-the-counter and prescription medicines only as told by your health care provider.  Do not use any products that contain nicotine or tobacco, such as cigarettes and e-cigarettes. If you need help quitting, ask your health care provider.  Eat a healthy diet. This includes plenty of fruits and vegetables, whole grains, and low-fat (lean) protein.  Keep all follow-up visits as told by your health care provider.  This is important. Contact a health care provider if:  Your symptoms do not get better or they get worse.  You have a fever. Get help right away if:  You have new or worse pain in your neck or upper back.  You have severe pain that cannot be controlled with medicines.  You are dizzy.  You have vision problems, blurred vision, or double vision.  You have a severe headache that is worse when  you stand.  You have nausea or you vomit.  You develop new or worse numbness or tingling in your back or legs.  You have pain, redness, swelling, or warmth in your arm or leg. Summary  Spinal stenosis occurs when the open space (spinal canal) between the bones of your spine (vertebrae) narrows. This narrowing puts pressure on the spinal cord or nerves.  Spinal stenosis can cause numbness, weakness, or pain in the neck, back, and legs.  This condition may be caused by a birth defect, arthritic deterioration of your vertebrae, injury, tumors, or calcium deposits.  This condition is usually diagnosed with MRIs, CT scans, and X-rays. This information is not intended to replace advice given to you by your health care provider. Make sure you discuss any questions you have with your health care provider. Document Released: 08/27/2003 Document Revised: 05/11/2016 Document Reviewed: 05/11/2016 Elsevier Interactive Patient Education  2017 [employer]  

## 2017-01-12 ENCOUNTER — Telehealth (INDEPENDENT_AMBULATORY_CARE_PROVIDER_SITE_OTHER): Payer: Self-pay | Admitting: Physician Assistant

## 2017-01-12 ENCOUNTER — Other Ambulatory Visit (INDEPENDENT_AMBULATORY_CARE_PROVIDER_SITE_OTHER): Payer: Self-pay | Admitting: Physician Assistant

## 2017-01-12 DIAGNOSIS — R768 Other specified abnormal immunological findings in serum: Secondary | ICD-10-CM

## 2017-01-12 LAB — COMPREHENSIVE METABOLIC PANEL
ALK PHOS: 121 IU/L — AB (ref 39–117)
ALT: 35 IU/L — ABNORMAL HIGH (ref 0–32)
AST: 30 IU/L (ref 0–40)
Albumin/Globulin Ratio: 1.5 (ref 1.2–2.2)
Albumin: 4.5 g/dL (ref 3.5–5.5)
BUN/Creatinine Ratio: 21 (ref 9–23)
BUN: 16 mg/dL (ref 6–24)
Bilirubin Total: 0.3 mg/dL (ref 0.0–1.2)
CALCIUM: 9.7 mg/dL (ref 8.7–10.2)
CO2: 26 mmol/L (ref 20–29)
CREATININE: 0.78 mg/dL (ref 0.57–1.00)
Chloride: 98 mmol/L (ref 96–106)
GFR calc Af Amer: 101 mL/min/{1.73_m2} (ref >59)
GFR, EST NON AFRICAN AMERICAN: 88 mL/min/{1.73_m2} (ref >59)
GLOBULIN, TOTAL: 3.1 g/dL (ref 1.5–4.5)
GLUCOSE: 71 mg/dL (ref 65–99)
Potassium: 3.6 mmol/L (ref 3.5–5.2)
Sodium: 140 mmol/L (ref 134–144)
Total Protein: 7.6 g/dL (ref 6.0–8.5)

## 2017-01-12 LAB — HEPATITIS C ANTIBODY

## 2017-01-12 LAB — CBC WITH DIFFERENTIAL/PLATELET
BASOS ABS: 0 10*3/uL (ref 0.0–0.2)
Basos: 0 %
EOS (ABSOLUTE): 0.1 10*3/uL (ref 0.0–0.4)
EOS: 2 %
HEMATOCRIT: 38.4 % (ref 34.0–46.6)
HEMOGLOBIN: 12.8 g/dL (ref 11.1–15.9)
IMMATURE GRANULOCYTES: 0 %
Immature Grans (Abs): 0 10*3/uL (ref 0.0–0.1)
LYMPHS ABS: 1.7 10*3/uL (ref 0.7–3.1)
Lymphs: 34 %
MCH: 30.6 pg (ref 26.6–33.0)
MCHC: 33.3 g/dL (ref 31.5–35.7)
MCV: 92 fL (ref 79–97)
MONOCYTES: 8 %
MONOS ABS: 0.4 10*3/uL (ref 0.1–0.9)
NEUTROS PCT: 56 %
Neutrophils Absolute: 2.7 10*3/uL (ref 1.4–7.0)
Platelets: 173 10*3/uL (ref 150–379)
RBC: 4.18 x10E6/uL (ref 3.77–5.28)
RDW: 14.1 % (ref 12.3–15.4)
WBC: 5 10*3/uL (ref 3.4–10.8)

## 2017-01-12 LAB — LIPID PANEL
CHOL/HDL RATIO: 2.3 ratio (ref 0.0–4.4)
CHOLESTEROL TOTAL: 173 mg/dL (ref 100–199)
HDL: 74 mg/dL (ref >39)
LDL CALC: 79 mg/dL (ref 0–99)
TRIGLYCERIDES: 100 mg/dL (ref 0–149)
VLDL CHOLESTEROL CAL: 20 mg/dL (ref 5–40)

## 2017-01-12 LAB — ENA+DNA/DS+SJORGEN'S
ENA RNP Ab: 0.2 AI (ref 0.0–0.9)
ENA SSA (RO) AB: 1.2 AI — AB (ref 0.0–0.9)
ENA SSB (LA) Ab: <0.2 AI (ref 0.0–0.9)

## 2017-01-12 LAB — ANA W/REFLEX: Anti Nuclear Antibody(ANA): POSITIVE — AB

## 2017-01-12 LAB — HIV ANTIBODY (ROUTINE TESTING W REFLEX): HIV Screen 4th Generation wRfx: NONREACTIVE

## 2017-01-12 NOTE — Telephone Encounter (Signed)
Patient received mailed results.

## 2017-01-12 NOTE — Telephone Encounter (Signed)
-----   Message from Clent Demark, PA-C sent at 12/20/2016  1:50 PM EDT ----- XR L spine reveals some narrowing of the neural foramen at L5 -  S1. She should have a non-emergent MRI to further assess. I will send an orthopedic referral now.

## 2017-01-12 NOTE — Telephone Encounter (Signed)
Patient called stated need lab results, and wants Rx Boteran Gel it help for pain on her arm.  Per patient on her last office summary potassium is listed as one of her medicine, Per patient she did not get a RX for Potassium.  Please follow up with patient.

## 2017-01-12 NOTE — Telephone Encounter (Signed)
Patient left voicemail stated has questions regarding medication prices, and also wants to know who is denying MRI to be done.  Patient would like to talk to Domenica Fail PA.  Please follow up with patient.

## 2017-01-13 ENCOUNTER — Encounter (INDEPENDENT_AMBULATORY_CARE_PROVIDER_SITE_OTHER): Payer: Self-pay

## 2017-01-13 LAB — URINE CULTURE

## 2017-02-01 ENCOUNTER — Telehealth (INDEPENDENT_AMBULATORY_CARE_PROVIDER_SITE_OTHER): Payer: Self-pay | Admitting: Physician Assistant

## 2017-02-01 NOTE — Telephone Encounter (Signed)
FWD to PCP. Tempestt S Roberts, CMA  

## 2017-02-01 NOTE — Telephone Encounter (Signed)
Patient called and stated that she spoke with medicaid, and they will be more than glad to pay for her MRI if PCP adds enough information to support medicaid to pay for the imaging. They also told her that they will pay for the voltaren gel as long as it is a generic brand.

## 2017-02-01 NOTE — Telephone Encounter (Signed)
I have stated several times that an MRI "of the whole body" will not be ordered nor approved. Patient can schedule appointment so I can tell this to her again. There will have to be a focus and medical necessity.

## 2017-02-07 ENCOUNTER — Ambulatory Visit (INDEPENDENT_AMBULATORY_CARE_PROVIDER_SITE_OTHER): Payer: Medicaid Other | Admitting: Orthopaedic Surgery

## 2017-02-07 ENCOUNTER — Ambulatory Visit (INDEPENDENT_AMBULATORY_CARE_PROVIDER_SITE_OTHER): Payer: Medicaid Other

## 2017-02-07 ENCOUNTER — Encounter (INDEPENDENT_AMBULATORY_CARE_PROVIDER_SITE_OTHER): Payer: Self-pay | Admitting: Orthopaedic Surgery

## 2017-02-07 VITALS — BP 123/72 | HR 73 | Temp 97.0°F | Ht 63.0 in | Wt 220.0 lb

## 2017-02-07 DIAGNOSIS — G8929 Other chronic pain: Secondary | ICD-10-CM

## 2017-02-07 DIAGNOSIS — M25561 Pain in right knee: Secondary | ICD-10-CM

## 2017-02-07 DIAGNOSIS — M25511 Pain in right shoulder: Secondary | ICD-10-CM

## 2017-02-07 DIAGNOSIS — M4807 Spinal stenosis, lumbosacral region: Secondary | ICD-10-CM | POA: Diagnosis not present

## 2017-02-07 NOTE — Progress Notes (Signed)
Office Visit Note   [name]           Date of Birth: 08-Nov-1965           MRN: [id number] Visit Date: 02/07/2017              Requested by: Clent Demark, PA-C [place], [place] [address] PCP: Clent Demark, PA-C   Assessment & Plan: Visit Diagnoses:  1. Chronic right shoulder pain   2. Chronic pain of right knee   3.     Hx of low back pain with facet degenative changes.   Plan: Patient states that she wants to get her Roxicet 15 mg 4 times a day [place]. I discussed with her that we do not do pain management. Offered her option obtaining an MRI lumbar spine so we can evaluate lumbar spine for evidence of nerve compression that could be causing her chronic pain. We had a discussion about the finding someplace where she could do some pool therapy to help with weight loss and core strengthening. We discussed diet as well as a walking program. Patient states that she wanted a rolling walker. She does not have neurogenic claudication symptoms and I discussed the recommendation for increasing her activity help with weight loss and core strengthening. Plan to see her back after the MRI scan. Patient expressed that she is unhappy that I did not give her a prescription for the rolling walker because she asked for it and also that " I did not give her Roxi because she had asked for them"  . I discussed with her my recommendations and again explained her that we do not do chronic pain management and  would be willing to evaluate her for potential causes of her pain. She has gradually ramped up on pain medication over several years and is now taking the equivalent of 12  Regular Percocets per day and she explained me that she still is in pain and needs stronger pain medicine.  Follow-Up Instructions: No Follow-up on file.   Orders:  Orders Placed This Encounter  Procedures  . XR Shoulder Right  . XR Knee 1-2 Views Right   No orders of the defined  types were placed in this encounter.     Procedures: No procedures performed   Clinical Data: No additional findings.   Subjective: Chief Complaint  Patient presents with  . Lower Back - Pain  . Right Knee - Pain    HPI patients here with 2 problems one is chronic back pain she states her back is her for more than 5 years she has trouble walking a significant distance. She states MRI was ordered but was denied by [facility]. She has weakness with ambulation. She is taking Roxicet 15 mg 4 times a day and this is per been prescribed by Dr. Particia Lather in [place]. Since also had pain with her shoulder she has difficulty reaching the back of her head. She is able get her arm up overhead but with external rotation she has some discomfort. X-rays were obtained today of the shoulder. Patient denies any fever or chills no associated bowel bladder symptoms.Patient denies associated neck pain. No numbness or tingling in her hand. She denies fever, chills,  No bowel or   bladder symptoms.  Review of Systems positive for anxiety, depression, hypertension, obesity, history of seizures, vitamin D deficiency, nasal Narcan administration 12/31/2015 from chart review, history of albuterol use inhaler. Positive for long-term  narcotic usage under pain contract prescribed in [place].   Objective: Vital Signs: BP 123/72 (BP Location: Left Arm, Patient Position: Sitting, Cuff Size: Large)   Pulse 73   Temp (!) 97 F (36.1 C) (Oral)   Ht 5\' 3"  (1.6 m)   Wt 220 lb (99.8 kg)   BMI 38.97 kg/m   Physical Exam  Constitutional: She is oriented to person, place, and time. She appears well-developed.  HENT:  Head: Normocephalic.  Right Ear: External ear normal.  Left Ear: External ear normal.  Eyes: Pupils are equal, round, and reactive to light.  Neck: No tracheal deviation present. No thyromegaly present.  Cardiovascular: Normal rate.   Pulmonary/Chest: Effort normal.  Abdominal: Soft.    trunchal  obesity with BMI 38.97  Musculoskeletal:  Patient has mild discomfort with impingement maneuver. She can get her arm to the top of her head. Negative drop arm test. No shoulder subluxation on the right shoulder. Reflexes are 2+ and symmetrical upper and lower extremities. Negative straight leg raising 90. Normal heel-to-toe gait. Distal pulses are palpable. No rash or exposed skin tenderness.  Neurological: She is alert and oriented to person, place, and time.  Skin: Skin is warm and dry.  Psychiatric: She has a normal mood and affect. Her behavior is normal.    Ortho Exam no pain with internal/external rotation of the hip she has good quad strength no abductor weakness. These reach full extension no knee effusion is present.   Specialty Comments:  No specialty comments available.  Imaging: Study Result   CLINICAL DATA:  Lower back pain x 4-5 years. Worsened 2-3 months. No injury. Concerned for lump behind neck.  EXAM: LUMBAR SPINE - COMPLETE 4+ VIEW  COMPARISON:  None.  FINDINGS: Disc desiccation at L5-S1, with associated disc space narrowing and osseous spurring, moderate in degree. Additional degenerative facet hypertrophy at L4-5 and L5-S1, most prominent at L5-S1, with probable associated osseous neural foramen narrowing at L5-S1.  Minimal anterolisthesis of L4 on L5 is likely related to the underlying degenerative change. Additional minimal anterolisthesis of T11 is also likely related to additional disc desiccation at T11-12.  No acute or suspicious osseous finding. No evidence of acute vertebral body subluxation. No evidence of pars interarticularis defect. Visualized paravertebral soft tissues are unremarkable.  IMPRESSION: 1. Degenerative changes within the lower lumbar spine, as detailed above, with suspected osseous neural foramen narrowing at L5-S1 and possible associated nerve root impingement. Consider nonemergent lumbar spine MRI for  further characterization, especially if any associated radiculopathic symptoms. 2. No acute findings.   Electronically Signed   By: Franki Cabot M.D.   On: 12/16/2016 16:29       PMFS History: There are no active problems to display for this patient.  Past Medical History:  Diagnosis Date  . Allergy   . Anxiety   . Arthritis   . Asthma   . Blood transfusion without reported diagnosis   . Depression   . GERD (gastroesophageal reflux disease)   . High cholesterol   . Hypertension   . Obesity   . Osteoporosis   . Recurrent boils   . Seizures ([place])    last seizure "3 years ago" per pt 06-16-16 KBW  . Sleep apnea    no CPAP used  . Uterine fibroid     Family History  Problem Relation Age of Onset  . Colon cancer Neg Hx   . Esophageal cancer Neg Hx   . Rectal cancer Neg Hx   .  Stomach cancer Neg Hx     Past Surgical History:  Procedure Laterality Date  . ABDOMINAL HYSTERECTOMY    . CESAREAN SECTION    . KNEE SURGERY Right    x2  . UPPER GASTROINTESTINAL ENDOSCOPY     Social History   Occupational History  . Not on file.   Social History Main Topics  . Smoking status: Never Smoker  . Smokeless tobacco: Never Used  . Alcohol use No  . Drug use: Yes    Types: "Crack" cocaine     Comment: last smoked 2011  . Sexual activity: Not on file

## 2017-02-08 ENCOUNTER — Telehealth (INDEPENDENT_AMBULATORY_CARE_PROVIDER_SITE_OTHER): Payer: Self-pay | Admitting: Radiology

## 2017-02-08 NOTE — Telephone Encounter (Signed)
Patient had left voicemail on the triage line today. She was very upset and frustrated She leaves a very detailed message of how upset she is. States that Dr. Lorin Mercy is worried about the pills she on and not whats wrong with her.. She was seen in the office yesterday. I actually brought this patient back to exam room for initial work up and she had requested that roxicet rx she is receiving now currently from a [facility], is something she would like to get filled here in [place] so her insurance will pay. She is upset that Dr.Yates told her to lose weight, she is upset she is denied a rollator walker. She was told that increasing her activity and losing weight would help her overall well being. This is true, but patient is unhappy and displeased with this answer. She feels like she cannot walk due to her pain. She feels like her concerns were ignored. She asked me what kind of doctor, Dr. Lorin Mercy is. She feels he did not look over any of her imaging studies. I explained to her he did, and the report from her MRI of lumbar spine is even included in her office visit note. She tells me she is going to report him to medicaid. I apologized she felt this way. Advised her all I could do was relay her message.

## 2017-02-09 NOTE — Telephone Encounter (Signed)
fyi

## 2017-02-15 ENCOUNTER — Other Ambulatory Visit (INDEPENDENT_AMBULATORY_CARE_PROVIDER_SITE_OTHER): Payer: Self-pay | Admitting: Orthopaedic Surgery

## 2017-02-15 DIAGNOSIS — M4807 Spinal stenosis, lumbosacral region: Secondary | ICD-10-CM

## 2017-02-15 DIAGNOSIS — M545 Low back pain: Secondary | ICD-10-CM

## 2017-02-25 ENCOUNTER — Other Ambulatory Visit: Payer: Medicaid Other

## 2017-02-28 ENCOUNTER — Ambulatory Visit
Admission: RE | Admit: 2017-02-28 | Discharge: 2017-02-28 | Disposition: A | Discharge status: Home / Self Care | Payer: Medicaid Other | Source: Ambulatory Visit | Attending: Orthopaedic Surgery | Admitting: Orthopaedic Surgery

## 2017-02-28 DIAGNOSIS — M4807 Spinal stenosis, lumbosacral region: Secondary | ICD-10-CM

## 2017-03-01 ENCOUNTER — Other Ambulatory Visit: Payer: Medicaid Other

## 2017-03-01 ENCOUNTER — Encounter (INDEPENDENT_AMBULATORY_CARE_PROVIDER_SITE_OTHER): Payer: Self-pay | Admitting: Orthopaedic Surgery

## 2017-03-01 ENCOUNTER — Ambulatory Visit (INDEPENDENT_AMBULATORY_CARE_PROVIDER_SITE_OTHER): Payer: Medicaid Other | Admitting: Orthopaedic Surgery

## 2017-03-01 VITALS — BP 134/84 | HR 66

## 2017-03-01 DIAGNOSIS — M5136 Other intervertebral disc degeneration, lumbar region: Secondary | ICD-10-CM | POA: Diagnosis not present

## 2017-03-01 NOTE — Progress Notes (Signed)
Office Visit Note   [name]           Date of Birth: June 06, 1966           MRN: [id number] Visit Date: 03/01/2017              Requested by: Clent Demark, PA-C [place], [place] [address] PCP: Clent Demark, PA-C   Assessment & Plan: Visit Diagnoses:  1. Other intervertebral disc degeneration, lumbar region     Plan: Patient got in previously good relief with an epidural years ago. Will set up for single epidural with Dr. Ernestina Patches. Office follow-up here will be when necessary. We discussed weight loss using the treadmill she has at home. She doesn't have old ride so that she is able to get any place to do pool exercises. She needs to work on diet walking program and weight loss to help her back symptoms. We reviewed the MRI scan and give her copy of the report. Chest some disc degeneration particularly at the L5-S1 level with endplate edema. Over a number of years she's gradually gotten out of shape we discussed activity she needs to work on temporary move and help her pain symptoms.  Follow-Up Instructions: Return if symptoms worsen or fail to improve.   Orders:  No orders of the defined types were placed in this encounter.  No orders of the defined types were placed in this encounter.     Procedures: No procedures performed   Clinical Data: No additional findings.   Subjective: Chief Complaint  Patient presents with  . Lower Back - Follow-up    MRI review    HPI patient turned she states still having considerable problems with chronic back pain. States at times is severe. She had a previous injection lumbar years ago and states that it worked well. Has been obtained which shows considerable disc degeneration L5-S1 level with some by foraminal narrowing at L5-S1. At the L4-5 level she has some facet arthropathy with anterolisthesis of 3 mm. Some narrowing of the lateral recess without significant stenosis.  Review of Systems  positive for anxiety depression, hypertension, obesity, history of seizures, history of nasal Narcan administration, history of long-term narcotic usage under pain contract while in [place]. She is now moved to [place].   Objective: Vital Signs: BP 134/84   Pulse 66   Physical Exam  Constitutional: She is oriented to person, place, and time. She appears well-developed.  HENT:  Head: Normocephalic.  Right Ear: External ear normal.  Left Ear: External ear normal.  Eyes: Pupils are equal, round, and reactive to light.  Neck: No tracheal deviation present. No thyromegaly present.  Cardiovascular: Normal rate.   Pulmonary/Chest: Effort normal.  Abdominal: Soft.  Musculoskeletal:  Patient walks with slow stride the short stride gait. She complains of the pain lumbar spine she has good hip range of motion. Knees reach full extension. Reflexes are intact knees reach full flexion. Anterior cruciate ligament PCL exam is symmetrical. Anterior tib EHL is intact.  Neurological: She is alert and oriented to person, place, and time.  Skin: Skin is warm and dry.  Psychiatric: She has a normal mood and affect. Her behavior is normal.   BMI of 38.97.  Ortho Exam  Specialty Comments:  No specialty comments available.  Imaging: Mr Lumbar Spine W/o Contrast  Result Date: 02/28/2017 CLINICAL DATA:  Chronic central low back pain, worse with lifting. EXAM: MRI LUMBAR SPINE WITHOUT CONTRAST TECHNIQUE: Multiplanar, multisequence  MR imaging of the lumbar spine was performed. No intravenous contrast was administered. COMPARISON:  Radiography 12/16/2016 FINDINGS: Segmentation:  5 lumbar type vertebral bodies. Alignment: 3 mm anterolisthesis T11-12. 2 mm retrolisthesis L3-4. 3 mm anterolisthesis L4-5. Vertebrae:  No fracture or primary bone lesion. Conus medullaris: Extends to the T12-L1 level and appears normal. Paraspinal and other soft tissues: Negative Disc levels: T11-12: Advanced bilateral facet  arthropathy with 3 mm anterolisthesis. Bulging of the disc. No compressive central canal stenosis. T12-L1:  No disc abnormality.  Mild facet hypertrophy.  No stenosis. L1-2: No disc abnormality. Minimal facet hypertrophy. No stenosis. L2-3: Mild bulging of the disc. Mild facet hypertrophy. No stenosis. L3-4: Retrolisthesis of 2 mm. Mild bulging of the disc. Mild bilateral facet arthropathy. No stenosis. L4-5: Advanced bilateral facet arthropathy with 3 mm anterolisthesis. Facet joint edema and facet effusions. Anterolisthesis could worsen with standing or flexion. Disc degeneration with shallow circumferential protrusion of the disc. Stenosis of the lateral recesses and foramina left worse than right which could cause neural compression, particularly on the left. This could worsen with standing or flexion. L5-S1: Chronic disc degeneration with loss of disc height. Endplate osteophytes and bulging of the disc. Bilateral facet osteoarthritis. No compressive central canal stenosis. Bilateral foraminal narrowing that could possibly affect either L5 nerve. IMPRESSION: T11-12: Advanced bilateral facet arthropathy with 3 mm of anterolisthesis. Bulging of the disc. No visible compressive stenosis. This level was not completely evaluated in the axial plane. L3-4: 2 mm retrolisthesis. Mild bulging of the disc. Mild facet arthritis. No stenosis. L4-5: Advanced bilateral facet arthropathy likely to be symptomatic. Edema in joint effusions. Anterolisthesis of 3 mm. Shallow broad-based herniation of the disc. Stenosis of the lateral recesses and neural foramina that could cause neural compression on either or both sides. Finding slightly worse on the left. Certainly, the facet arthropathy could be a cause of low back pain. L5-S1: Chronic disc degeneration with endplate osteophytes and bulging of the disc. Facet osteoarthritis bilaterally. No compressive central canal stenosis. Bilateral foraminal stenosis that would have some  potential to affect the exiting L5 nerves. The degenerative disc disease and degenerative facet disease could certainly contribute to low back pain. Electronically Signed   By: [place] Chimes M.D.   On: 02/28/2017 15:12     PMFS History: There are no active problems to display for this patient.  Past Medical History:  Diagnosis Date  . Allergy   . Anxiety   . Arthritis   . Asthma   . Blood transfusion without reported diagnosis   . Depression   . GERD (gastroesophageal reflux disease)   . High cholesterol   . Hypertension   . Obesity   . Osteoporosis   . Recurrent boils   . Seizures ([place])    last seizure "3 years ago" per pt 06-16-16 KBW  . Sleep apnea    no CPAP used  . Uterine fibroid     Family History  Problem Relation Age of Onset  . Colon cancer Neg Hx   . Esophageal cancer Neg Hx   . Rectal cancer Neg Hx   . Stomach cancer Neg Hx     Past Surgical History:  Procedure Laterality Date  . ABDOMINAL HYSTERECTOMY    . CESAREAN SECTION    . KNEE SURGERY Right    x2  . UPPER GASTROINTESTINAL ENDOSCOPY     Social History   Occupational History  . Not on file.   Social History Main Topics  . Smoking status:  Never Smoker  . Smokeless tobacco: Never Used  . Alcohol use No  . Drug use: Yes    Types: "Crack" cocaine     Comment: last smoked 2011  . Sexual activity: Not on file

## 2017-03-01 NOTE — Addendum Note (Signed)
Addended by: Meyer Cory on: 03/01/2017 11:34 AM   Modules accepted: Orders

## 2017-03-08 ENCOUNTER — Ambulatory Visit (INDEPENDENT_AMBULATORY_CARE_PROVIDER_SITE_OTHER): Payer: Medicaid Other | Admitting: Physician Assistant

## 2017-03-08 ENCOUNTER — Telehealth (INDEPENDENT_AMBULATORY_CARE_PROVIDER_SITE_OTHER): Payer: Self-pay | Admitting: Physician Assistant

## 2017-03-08 ENCOUNTER — Encounter (INDEPENDENT_AMBULATORY_CARE_PROVIDER_SITE_OTHER): Payer: Self-pay | Admitting: Physician Assistant

## 2017-03-08 VITALS — BP 107/63 | HR 70 | Temp 97.9°F | Wt 210.0 lb

## 2017-03-08 DIAGNOSIS — M5136 Other intervertebral disc degeneration, lumbar region: Secondary | ICD-10-CM | POA: Diagnosis not present

## 2017-03-08 DIAGNOSIS — I10 Essential (primary) hypertension: Secondary | ICD-10-CM

## 2017-03-08 DIAGNOSIS — Z23 Encounter for immunization: Secondary | ICD-10-CM

## 2017-03-08 MED ORDER — GABAPENTIN 300 MG PO CAPS: 600.0000 mg | ORAL_CAPSULE | Freq: Three times a day (TID) | ORAL | 3 refills | Status: DC

## 2017-03-08 MED ORDER — STEP N REST II WALKER MISC: 1.0000 | Freq: Every day | 0 refills | Status: DC

## 2017-03-08 NOTE — Telephone Encounter (Signed)
Pt called to request for the pcp to fax the order for a step chair to [facility] fax  [phone] also a letter of necessity of why she need the chair since she has [employer], please follow up

## 2017-03-08 NOTE — Progress Notes (Signed)
Subjective:  Patient ID: Heather Perry, female    DOB: 1965-11-13  Age: 51 y.o. MRN: [id number]  CC:  Blood pressure  HPI Heather Perry is a 51 y.o. female with a medical history of multiple  comorbidites presents to f/u on LBP. She has seen ortho on 02/07/17 and on 03/01/17. Ortho recommended MRI which revealed multiple levels of degeneration. She is scheduled to have epidural injections on 03/14/17. Ortho had noted patient was asking for a walker and Roxicet qid but she was denied. Ortho notes she is currently taking the equivalent of 12 regular Percocets per day and yet she is still in pain. Patient currently complains of lower back pain and is awaiting her appointment at the pain clinic, forgot the name of pain clinic she will attend.     AVISE testing recommended by rheumatologist as she was found to have positive ANA with a mildly elevated SSA.      Outpatient Medications Prior to Visit  Medication Sig Dispense Refill  . acyclovir ointment (ZOVIRAX) 5 % Apply 1 application topically every 3 (three) hours. (Patient not taking: Reported on 12/12/2016) 15 g 1  . albuterol (PROVENTIL HFA;VENTOLIN HFA) 108 (90 Base) MCG/ACT inhaler Inhale 2 puffs into the lungs every 6 (six) hours as needed for wheezing or shortness of breath. (Patient not taking: Reported on 02/07/2017) 1 Inhaler 1  . aspirin 325 MG EC tablet TAKE 1 TABLET (325 MG TOTAL) BY MOUTH DAILY. 90 tablet 1  . budesonide-formoterol (SYMBICORT) 160-4.5 MCG/ACT inhaler Inhale 2 puffs into the lungs 2 (two) times daily. 1 Inhaler 1  . celecoxib (CELEBREX) 200 MG capsule Take 1 capsule (200 mg total) by mouth 2 (two) times daily. 90 capsule 1  . chlorhexidine (HIBICLENS) 4 % external liquid Apply topically daily as needed. 120 mL 0  . citalopram (CELEXA) 40 MG tablet Take 1 tablet (40 mg total) by mouth daily. 90 tablet 1  . cyclobenzaprine (FLEXERIL) 10 MG tablet Take 10 mg by mouth 3 (three) times daily as needed for muscle spasms.    .  diazepam (VALIUM) 5 MG tablet Take 5 mg by mouth every 6 (six) hours as needed for anxiety.    . ergocalciferol (VITAMIN D2) 50000 units capsule Take 1 capsule (50,000 Units total) by mouth once a week. 12 capsule 1  . esomeprazole (NEXIUM) 40 MG capsule Take 1 capsule (40 mg total) by mouth daily at 12 noon. 90 capsule 1  . gabapentin (NEURONTIN) 300 MG capsule Take 1 capsule (300 mg total) by mouth 3 (three) times daily. 270 capsule 1  . hydrochlorothiazide (HYDRODIURIL) 25 MG tablet Take 1 tablet (25 mg total) by mouth daily. 90 tablet 1  . ipratropium-albuterol (DUONEB) 0.5-2.5 (3) MG/3ML SOLN Take 3 mLs by nebulization every 6 (six) hours as needed. 360 mL 1  . oxyCODONE (ROXICODONE) 15 MG immediate release tablet Take 15 mg by mouth every 4 (four) hours as needed for pain.    . phenytoin (DILANTIN) 100 MG ER capsule Take 1 capsule (100 mg total) by mouth 3 (three) times daily. 90 capsule 5  . potassium chloride SA (K-DUR,KLOR-CON) 20 MEQ tablet Take 1 tablet (20 mEq total) by mouth 2 (two) times daily. 30 tablet 5  . pravastatin (PRAVACHOL) 20 MG tablet Take 1 tablet (20 mg total) by mouth daily. 90 tablet 1  . sulfamethoxazole-trimethoprim (BACTRIM DS) 800-160 MG tablet Take 1 tablet by mouth 2 (two) times daily. 20 tablet 0  . thiamine (VITAMIN B-1) 100 MG  tablet Take 1 tablet (100 mg total) by mouth daily. 90 tablet 1  . thiamine 100 MG tablet Take 100 mg by mouth daily.    Marland Kitchen triamcinolone cream (KENALOG) 0.1 % APPLY 1 APPLICATION TOPICALLY 2 TIMES DAILY 30 g 0  . zolpidem (AMBIEN) 10 MG tablet Take 10 mg by mouth at bedtime as needed for sleep.     Facility-Administered Medications Prior to Visit  Medication Dose Route Frequency Provider Last Rate Last Dose  . 0.9 %  sodium chloride infusion  500 mL Intravenous Continuous Nandigam, Kavitha V, MD         ROS Review of Systems  Constitutional: Negative for chills, fever and malaise/fatigue.  Eyes: Negative for blurred vision.   Respiratory: Negative for shortness of breath.   Cardiovascular: Negative for chest pain and palpitations.  Gastrointestinal: Negative for abdominal pain and nausea.  Genitourinary: Negative for dysuria and hematuria.  Musculoskeletal: Positive for back pain. Negative for joint pain and myalgias.  Skin: Negative for rash.  Neurological: Negative for tingling and headaches.  Psychiatric/Behavioral: Negative for depression. The patient is not nervous/anxious.     Objective:  BP 107/63 (BP Location: Left Arm, Patient Position: Sitting, Cuff Size: Large)   Pulse 70   Temp 97.9 F (36.6 C) (Oral)   Wt 210 lb (95.3 kg)   SpO2 100%   BMI 37.20 kg/m   BP/Weight 03/08/2017 03/01/2017 0/97/3532  Systolic BP 992 426 834  Diastolic BP 63 84 72  Wt. (Lbs) 210 - 220  BMI 37.2 - 38.97      Physical Exam  Constitutional: She is oriented to person, place, and time.  Well developed, obese, NAD, polite  HENT:  Head: Normocephalic and atraumatic.  Eyes: No scleral icterus.  Neck: Normal range of motion. Neck supple. No thyromegaly present.  Cardiovascular: Normal rate, regular rhythm and normal heart sounds.   Pulmonary/Chest: Effort normal and breath sounds normal.  Abdominal: Soft. Bowel sounds are normal. There is no tenderness.  Musculoskeletal: She exhibits no edema.  Neurological: She is alert and oriented to person, place, and time.  Skin: Skin is warm and dry. No rash noted. No erythema. No pallor.  Psychiatric: She has a normal mood and affect. Her behavior is normal. Thought content normal.  Somewhat agitated. Abnormal speech. pronounces and holds last letter of some words eg, Lupussssssssssss. Seems her train of thought while still talking.   Vitals reviewed.    Assessment & Plan:     1. Lumbar degenerative disc disease - Misc. Devices (STEP N REST II WALKER) MISC; 1 each by Does not apply route daily.  Dispense: 1 each; Refill: 0 - Increase gabapentin (NEURONTIN) 300 MG  capsule; Take 2 capsules (600 mg total) by mouth 3 (three) times daily.  Dispense: 180 capsule; Refill: 3  2. Hypertension, unspecified type - Basic metabolic panel  3. Need for prophylactic vaccination and inoculation against influenza - Flu Vaccine QUAD 6+ mos PF IM (Fluarix Quad PF)   Meds ordered this encounter  Medications  . DISCONTD: Misc. Devices (STEP N REST II WALKER) MISC    Sig: 1 each by Does not apply route daily.    Dispense:  1 each    Refill:  0    Order Specific Question:   Supervising Provider    Answer:   Tresa Garter [id number]  . Misc. Devices (STEP N REST II WALKER) MISC    Sig: 1 each by Does not apply route daily.    Dispense:  1 each    Refill:  0    Order Specific Question:   Supervising Provider    Answer:   Tresa Garter [id number]  . gabapentin (NEURONTIN) 300 MG capsule    Sig: Take 2 capsules (600 mg total) by mouth 3 (three) times daily.    Dispense:  180 capsule    Refill:  3    Order Specific Question:   Supervising Provider    Answer:   Tresa Garter [id number]    Follow-up: Return in about 6 months (around 09/05/2017) for HTN.   Clent Demark PA

## 2017-03-08 NOTE — Patient Instructions (Signed)
Degenerative Disk Disease Degenerative disk disease is a condition caused by the changes that occur in spinal disks as you grow older. Spinal disks are soft and compressible disks located between the bones of your spine (vertebrae). These disks act like shock absorbers. Degenerative disk disease can affect the whole spine. However, the neck and lower back are most commonly affected. Many changes can occur in the spinal disks with aging, such as:  The spinal disks may dry and shrink.  Small tears may occur in the tough, outer covering of the disk (annulus).  The disk space may become smaller due to loss of water.  Abnormal growths in the bone (spurs) may occur. This can put pressure on the nerve roots exiting the spinal canal, causing pain.  The spinal canal may become narrowed.  What increases the risk?  Being overweight.  Having a family history of degenerative disk disease.  Smoking.  There is increased risk if you are doing heavy lifting or have a sudden injury. What are the signs or symptoms? Symptoms vary from person to person and may include:  Pain that varies in intensity. Some people have no pain, while others have severe pain. The location of the pain depends on the part of your backbone that is affected. ? You will have neck or arm pain if a disk in the neck area is affected. ? You will have pain in your back, buttocks, or legs if a disk in the lower back is affected.  Pain that becomes worse while bending, reaching up, or with twisting movements.  Pain that may start gradually and then get worse as time passes. It may also start after a major or minor injury.  Numbness or tingling in the arms or legs.  How is this diagnosed? Your health care provider will ask you about your symptoms and about activities or habits that may cause the pain. He or she may also ask about any injuries, diseases, or treatments you have had. Your health care provider will examine you to check  for the range of movement that is possible in the affected area, to check for strength in your extremities, and to check for sensation in the areas of the arms and legs supplied by different nerve roots. You may also have:  An X-ray of the spine.  Other imaging tests, such as MRI.  How is this treated? Your health care provider will advise you on the best plan for treatment. Treatment may include:  Medicines.  Rehabilitation exercises.  Follow these instructions at home:  Follow proper lifting and walking techniques as advised by your health care provider.  Maintain good posture.  Exercise regularly as advised by your health care provider.  Perform relaxation exercises.  Change your sitting, standing, and sleeping habits as advised by your health care provider.  Change positions frequently.  Lose weight or maintain a healthy weight as advised by your health care provider.  Do not use any tobacco products, including cigarettes, chewing tobacco, or electronic cigarettes. If you need help quitting, ask your health care provider.  Wear supportive footwear.  Take medicines only as directed by your health care provider. Contact a health care provider if:  Your pain does not go away within 1-4 weeks.  You have significant appetite or weight loss. Get help right away if:  Your pain is severe.  You notice weakness in your arms, hands, or legs.  You begin to lose control of your bladder or bowel movements.  You have   fevers or night sweats. This information is not intended to replace advice given to you by your health care provider. Make sure you discuss any questions you have with your health care provider. Document Released: 04/03/2007 Document Revised: 11/12/2015 Document Reviewed: 10/08/2013 Elsevier Interactive Patient Education  2018 [employer]  

## 2017-03-10 ENCOUNTER — Telehealth (INDEPENDENT_AMBULATORY_CARE_PROVIDER_SITE_OTHER): Payer: Self-pay | Admitting: Physician Assistant

## 2017-03-10 ENCOUNTER — Telehealth (INDEPENDENT_AMBULATORY_CARE_PROVIDER_SITE_OTHER): Payer: Self-pay

## 2017-03-10 LAB — BASIC METABOLIC PANEL
BUN / CREAT RATIO: 21 (ref 9–23)
BUN: 15 mg/dL (ref 6–24)
CHLORIDE: 99 mmol/L (ref 96–106)
CO2: 27 mmol/L (ref 20–29)
Calcium: 10.2 mg/dL (ref 8.7–10.2)
Creatinine, Ser: 0.71 mg/dL (ref 0.57–1.00)
GFR calc non Af Amer: 98 mL/min/{1.73_m2} (ref >59)
GFR, EST AFRICAN AMERICAN: 112 mL/min/{1.73_m2} (ref >59)
GLUCOSE: 76 mg/dL (ref 65–99)
Potassium: 3.8 mmol/L (ref 3.5–5.2)
SODIUM: 142 mmol/L (ref 134–144)

## 2017-03-10 NOTE — Telephone Encounter (Signed)
Pt called back  to speak with the nurse since she need to know if she spoke with the pcp about the skin problem she has, please follow up

## 2017-03-10 NOTE — Telephone Encounter (Signed)
Patient aware of lab results. Nat Christen, CMA

## 2017-03-10 NOTE — Telephone Encounter (Signed)
-----   Message from Clent Demark, PA-C sent at 03/10/2017  8:41 AM EDT ----- Potassium and kidney filtration normal.

## 2017-03-13 ENCOUNTER — Telehealth (INDEPENDENT_AMBULATORY_CARE_PROVIDER_SITE_OTHER): Payer: Self-pay | Admitting: Physician Assistant

## 2017-03-13 NOTE — Telephone Encounter (Signed)
Pt called to request a refill for Voltaren gel she want you to please sent a r=genetic brand that way medicaid can pay for it, please follow up

## 2017-03-14 ENCOUNTER — Ambulatory Visit (INDEPENDENT_AMBULATORY_CARE_PROVIDER_SITE_OTHER): Payer: Medicaid Other

## 2017-03-14 ENCOUNTER — Telehealth (INDEPENDENT_AMBULATORY_CARE_PROVIDER_SITE_OTHER): Payer: Self-pay | Admitting: Physician Assistant

## 2017-03-14 ENCOUNTER — Ambulatory Visit (INDEPENDENT_AMBULATORY_CARE_PROVIDER_SITE_OTHER): Payer: Medicaid Other | Admitting: Physical Medicine and Rehabilitation

## 2017-03-14 ENCOUNTER — Encounter (INDEPENDENT_AMBULATORY_CARE_PROVIDER_SITE_OTHER): Payer: Self-pay | Admitting: Physical Medicine and Rehabilitation

## 2017-03-14 VITALS — BP 139/77

## 2017-03-14 DIAGNOSIS — M47816 Spondylosis without myelopathy or radiculopathy, lumbar region: Secondary | ICD-10-CM | POA: Diagnosis not present

## 2017-03-14 MED ORDER — LIDOCAINE HCL (PF) 1 % IJ SOLN
2.0000 mL | Freq: Once | INTRAMUSCULAR | Status: AC
  Administered 2017-03-14: 2 mL

## 2017-03-14 MED ORDER — METHYLPREDNISOLONE ACETATE 80 MG/ML IJ SUSP
80.0000 mg | Freq: Once | INTRAMUSCULAR | Status: AC
  Administered 2017-03-14: 80 mg

## 2017-03-14 NOTE — Patient Instructions (Signed)

## 2017-03-14 NOTE — Telephone Encounter (Signed)
Pt called to ask for the pcp to check her x-ray for her throat, since she fill that is something shocking on her, she might need another x-ray she said, please follow up

## 2017-03-14 NOTE — Progress Notes (Deleted)
Patient reports low back pain with radicular right leg pain. Denies numbness or tingling. Reports waking with pain.

## 2017-03-15 ENCOUNTER — Telehealth (INDEPENDENT_AMBULATORY_CARE_PROVIDER_SITE_OTHER): Payer: Self-pay

## 2017-03-15 NOTE — Telephone Encounter (Signed)
Tiffany with [facility] would like a call back concerning patient.  CB# [phone].  Please advise.  Thank You.

## 2017-03-15 NOTE — Telephone Encounter (Signed)
I called and spoke with Heather Perry was with a patient. We had given patient a script for a rolling walker, but she is actually wanting a rollator with the seat. Heather Perry is going to fax the information to me in case Dr. Lorin Perry would like to change it and sign.  I advised that he is in surgery this afternoon and that it may be tomorrow before I have an answer.

## 2017-03-15 NOTE — Procedures (Signed)
[name] is a 52 year old female sent to me by Dr. Lorin Mercy for diagnostic and therapeutic lumbar injection. She's having chronic low back pain with pain into the right hip or than left but bilateral. Her biggest complaints are her back and knee pain. Her back pain is worse with standing and ambulating. Better when she sits. She is on 15 mg of oxycodone up to 4 times per day. She reports to me that she is seeing a pain management clinic for the first time tomorrow. She does not know which clinic this is been says the bus that she rides knows where to go. She does know that the time is 1:30 tomorrow. I will complete diagnostic of therapeutic bilateral facet joint blocks at L4-5. Depending on the relief her pain management physician may want to take over those injections. She'll follow-up with Dr. Lorin Mercy.  Lumbar Facet Joint Intra-Articular Injection(s) with Fluoroscopic Guidance  [name]      Date of Birth: 1965/09/02 MRN: [id number] PCP: Clent Demark, PA-C      Visit Date: 03/14/2017   Universal Protocol:    Date/Time: 03/14/2017  Consent Given By: the patient  Position: PRONE   Additional Comments: Vital signs were monitored before and after the procedure. Patient was prepped and draped in the usual sterile fashion. The correct patient, procedure, and site was verified.   Injection Procedure Details:  Procedure Site One Meds Administered:  Meds ordered this encounter  Medications  . lidocaine (PF) (XYLOCAINE) 1 % injection 2 mL  . methylPREDNISolone acetate (DEPO-MEDROL) injection 80 mg     Laterality: Bilateral  Location/Site:  L4-L5  Needle size: 22 guage  Needle type: Spinal  Needle Placement: Articular  Findings:  -Contrast Used: 1 mL iohexol 180 mg iodine/mL   -Comments: Excellent flow of contrast producing a partial arthrogram.  Procedure Details: The fluoroscope beam is vertically oriented in AP, and the inferior recess is visualized beneath  the lower pole of the inferior apophyseal process, which represents the target point for needle insertion. When direct visualization is difficult the target point is located at the medial projection of the vertebral pedicle. The region overlying each aforementioned target is locally anesthetized with a 1 to 2 ml. volume of 1% Lidocaine without Epinephrine.   The spinal needle was inserted into each of the above mentioned facet joints using biplanar fluoroscopic guidance. A 0.25 to 0.5 ml. volume of Isovue-250 was injected and a partial facet joint arthrogram was obtained. A single spot film was obtained of the resulting arthrogram.    One to 1.25 ml of the steroid/anesthetic solution was then injected into each of the facet joints noted above.   Additional Comments:  The patient tolerated the procedure well Dressing: Band-Aid    Post-procedure details: Patient was observed during the procedure. Post-procedure instructions were reviewed.  Patient left the clinic in stable condition.

## 2017-03-16 NOTE — Telephone Encounter (Signed)
Per Dr. Lorin Mercy, not medically indicated. He states that he sent back denial on first request.

## 2017-03-21 NOTE — Telephone Encounter (Signed)
Please connect with [facility] to see if they can assess and fill out form for DME to be approved by Medicaid/Medicare.

## 2017-03-21 NOTE — Telephone Encounter (Signed)
I faxed denial back again.

## 2017-03-22 NOTE — Telephone Encounter (Signed)
FWD to PCP. Tempestt S Roberts, CMA  

## 2017-03-24 NOTE — Telephone Encounter (Signed)
Mrs. Rollene Rotunda CMA faxed the order to [facility] and there have been no issues since.

## 2017-03-29 ENCOUNTER — Telehealth (INDEPENDENT_AMBULATORY_CARE_PROVIDER_SITE_OTHER): Payer: Self-pay | Admitting: Physician Assistant

## 2017-03-29 NOTE — Telephone Encounter (Signed)
Mrs San called again requesting an order for a Walker with a chair and to talk to [facility].

## 2017-03-29 NOTE — Telephone Encounter (Signed)
Heather Perry called regarding her walker with a chair supposedly someone fax the papers to him to authorize the walker .

## 2017-03-30 ENCOUNTER — Telehealth (INDEPENDENT_AMBULATORY_CARE_PROVIDER_SITE_OTHER): Payer: Self-pay | Admitting: Orthopaedic Surgery

## 2017-03-30 NOTE — Telephone Encounter (Signed)
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 CB # [phone]

## 2017-03-30 NOTE — Telephone Encounter (Signed)
Walker

## 2017-03-31 NOTE — Telephone Encounter (Signed)
I left message for return call. Dr. Lorin Mercy did not approve rollator walker or electric wheelchair. He will not write script for that.

## 2017-03-31 NOTE — Telephone Encounter (Signed)
I spoke with tiffany at [facility], and she stated that they had been communicating with Dr. Lorin Mercy office about ordering chair. Informed tiffany that per notes in epic, Dr. Lorin Mercy has denied the request for the seat, stating not medically necessary. Called and informed patient to contact Dr. Lorin Mercy, patient said No have PCP send order. PCP informed of request and will handle accordingly. Nat Christen, CMA

## 2017-04-03 ENCOUNTER — Telehealth (INDEPENDENT_AMBULATORY_CARE_PROVIDER_SITE_OTHER): Payer: Self-pay | Admitting: Physical Medicine and Rehabilitation

## 2017-04-03 NOTE — Telephone Encounter (Signed)
We have found that she was rejected twice for a seated walker by her orthopedic specialist. She would have to return to discuss the use of this chair as opposed to having physical therapy.

## 2017-04-03 NOTE — Telephone Encounter (Signed)
No, I would defer to Dr. Lorin Mercy who sees her more.

## 2017-04-06 NOTE — Telephone Encounter (Signed)
Patient has voicemail that has not been set up yet. Tempestt S Roberts, CMA  

## 2017-04-13 ENCOUNTER — Ambulatory Visit (INDEPENDENT_AMBULATORY_CARE_PROVIDER_SITE_OTHER): Payer: Medicaid Other | Admitting: Family Medicine

## 2017-04-13 VITALS — BP 132/88 | HR 77 | Temp 97.9°F | Wt 217.0 lb

## 2017-04-13 DIAGNOSIS — R21 Rash and other nonspecific skin eruption: Secondary | ICD-10-CM | POA: Diagnosis not present

## 2017-04-13 DIAGNOSIS — L821 Other seborrheic keratosis: Secondary | ICD-10-CM

## 2017-04-13 DIAGNOSIS — L818 Other specified disorders of pigmentation: Secondary | ICD-10-CM

## 2017-04-13 MED ORDER — TRIAMCINOLONE ACETONIDE 0.1 % EX CREA: TOPICAL_CREAM | Freq: Two times a day (BID) | CUTANEOUS | 2 refills | Status: DC

## 2017-04-13 MED ORDER — MUPIROCIN CALCIUM 2 % EX CREA: 1.0000 "application " | TOPICAL_CREAM | Freq: Two times a day (BID) | CUTANEOUS | 0 refills | Status: DC

## 2017-04-13 NOTE — Assessment & Plan Note (Signed)
  Reassurance to patient provided, advised these may increase in number as she ages. Advised sunscreen. Can use topical steroid ointment on larger areas to reduce size and appearance. Patient verbalized understanding and agreement with plan.

## 2017-04-13 NOTE — Assessment & Plan Note (Signed)
  Several areas on face, reassured patient these are benign. Counseled that removal via excision or cryotherapy may worsen appearance, cause unwanted scars or keloids to form. Patient verbalized understanding.

## 2017-04-13 NOTE — Patient Instructions (Signed)
   It was nice to meet you today.  We did not see anything concerning on your skin exam that we want to do further work up on.   The areas under your breast and on the inside of your thighs are likely due to chronic irritation. Try to wear loose fitting clothing, use baby powder to decrease irritation. Weight loss may help with these areas.   The small white spots on your body are benign and are called "idiopathic guttate hypomelanosis". They may become more numerous with aging. You can use topical steroids as you have been to try to reduce the appearance of these.   The spots on your face are also benign and are due to aging.   Please follow up as needed.   If you have questions or concerns please do not hesitate to call at [phone].  Lucila Maine, DO PGY-2, [facility] 04/13/2017 2:11 PM

## 2017-04-13 NOTE — Assessment & Plan Note (Signed)
  Areas under breast and on inner thighs consistent with chronic irritation, possibly a component of hidradenitis due to recurrent cysts that will drain occasionally. No current evidence of infection. Advised good hygiene, warm compresses if areas become inflamed or swollen. rx for bactroban given to use on inflamed areas. Counseled that weight loss may help reduce recurrence. Can use baby powder to keep area dry and protected from chafing. Patient verbalized understanding and agreement with plan.

## 2017-04-13 NOTE — Progress Notes (Signed)
    Subjective:    Patient ID: Heather Perry, female    DOB: 09-Oct-1965, 51 y.o.   MRN: [id number]   CC: skin exam  Patient referred by her PCP due to several areas of concern on patients skin:  Face bumps- reports several bumps on face that have been present for years. Not growing in size. Reports she has put triamcinolone ointment on them with some improvement in appearance. The bumps occasionally itch.   Irritation under breasts and on thighs- reports bumps that are darkened that come and go. On her thighs she is able to "pop" them. This has been happening for some time now. Has been prescribed antibiotics for the area under her breasts recently and was told to wash clothes in hot water if bumps come back. She has been putting triamcinolone ointment on areas under breast and on thighs with improvement in appearance of spots. The areas do not itch or hurt.   Small white dots over body- reports small pinpoint white spots over body that have becoming more numerous. They do not itch or hurt. The appearance of them is bothersome to her.   Smoking status reviewed- non-smoker  She denies hx of skin cancer  Review of Systems- denies chest pain, SOB, palpitations. No unintentional weight loss. No fevers or chills. Endorses heart burn/GI upset.    Objective:  BP 132/88   Pulse 77   Temp 97.9 F (36.6 C) (Oral)   Wt 217 lb (98.4 kg)   SpO2 99%   BMI 38.44 kg/m  Vitals and nursing note reviewed  General: well nourished, in no acute distress HEENT: normocephalic, no scleral icterus or conjunctival pallor, no nasal discharge, MMM Abdomen: obese abdomen, non-distended Extremities: no edema or cyanosis.  Skin: warm and dry. On face: several dome shaped hyperpigmented areas scattered over cheeks and around eyes. Under breasts/inner thighs: several scattered hyperpigmented areas, one erythematous papule without drainage or fluctuance underneath right breast. Stomach: hyperpigmented raised area  consistent with keloid on abdomen. Diffuse pinpoint hypopigmented areas over extremities, trunk, abdomen Neuro: alert and oriented, no focal deficits  Assessment & Plan:    Idiopathic guttate hypomelanosis  Reassurance to patient provided, advised these may increase in number as she ages. Advised sunscreen. Can use topical steroid ointment on larger areas to reduce size and appearance. Patient verbalized understanding and agreement with plan.   Dermatosis papulosa nigra  Several areas on face, reassured patient these are benign. Counseled that removal via excision or cryotherapy may worsen appearance, cause unwanted scars or keloids to form. Patient verbalized understanding.   Rash and nonspecific skin eruption  Areas under breast and on inner thighs consistent with chronic irritation, possibly a component of hidradenitis due to recurrent cysts that will drain occasionally. No current evidence of infection. Advised good hygiene, warm compresses if areas become inflamed or swollen. rx for bactroban given to use on inflamed areas. Counseled that weight loss may help reduce recurrence. Can use baby powder to keep area dry and protected from chafing. Patient verbalized understanding and agreement with plan.     Return as needed.   Lucila Maine, DO Family Medicine Resident PGY-2

## 2017-04-20 ENCOUNTER — Emergency Department (HOSPITAL_COMMUNITY): Payer: Medicaid Other

## 2017-04-20 ENCOUNTER — Encounter (HOSPITAL_COMMUNITY): Payer: Self-pay

## 2017-04-20 ENCOUNTER — Emergency Department (HOSPITAL_COMMUNITY)
Admission: EM | Admit: 2017-04-20 | Discharge: 2017-04-20 | Disposition: A | Discharge status: Home / Self Care | Payer: Medicaid Other | Attending: Emergency Medicine | Admitting: Emergency Medicine

## 2017-04-20 DIAGNOSIS — J45909 Unspecified asthma, uncomplicated: Secondary | ICD-10-CM | POA: Insufficient documentation

## 2017-04-20 DIAGNOSIS — Z7902 Long term (current) use of antithrombotics/antiplatelets: Secondary | ICD-10-CM | POA: Diagnosis not present

## 2017-04-20 DIAGNOSIS — I10 Essential (primary) hypertension: Secondary | ICD-10-CM | POA: Diagnosis not present

## 2017-04-20 DIAGNOSIS — R079 Chest pain, unspecified: Secondary | ICD-10-CM | POA: Diagnosis present

## 2017-04-20 DIAGNOSIS — K2950 Unspecified chronic gastritis without bleeding: Secondary | ICD-10-CM | POA: Diagnosis not present

## 2017-04-20 DIAGNOSIS — Z7982 Long term (current) use of aspirin: Secondary | ICD-10-CM | POA: Insufficient documentation

## 2017-04-20 DIAGNOSIS — R0789 Other chest pain: Secondary | ICD-10-CM | POA: Diagnosis not present

## 2017-04-20 LAB — CBC WITH DIFFERENTIAL/PLATELET
BASOS ABS: 0 10*3/uL (ref 0.0–0.1)
Basophils Relative: 1 %
Eosinophils Absolute: 0.3 10*3/uL (ref 0.0–0.7)
Eosinophils Relative: 5 %
HEMATOCRIT: 37.4 % (ref 36.0–46.0)
HEMOGLOBIN: 12.4 g/dL (ref 12.0–15.0)
LYMPHS PCT: 34 %
Lymphs Abs: 2.2 10*3/uL (ref 0.7–4.0)
MCH: 30 pg (ref 26.0–34.0)
MCHC: 33.2 g/dL (ref 30.0–36.0)
MCV: 90.6 fL (ref 78.0–100.0)
MONO ABS: 0.3 10*3/uL (ref 0.1–1.0)
MONOS PCT: 5 %
NEUTROS ABS: 3.6 10*3/uL (ref 1.7–7.7)
NEUTROS PCT: 55 %
Platelets: 187 10*3/uL (ref 150–400)
RBC: 4.13 MIL/uL (ref 3.87–5.11)
RDW: 13.6 % (ref 11.5–15.5)
WBC: 6.5 10*3/uL (ref 4.0–10.5)

## 2017-04-20 LAB — COMPREHENSIVE METABOLIC PANEL
ALBUMIN: 3.7 g/dL (ref 3.5–5.0)
ALT: 28 U/L (ref 14–54)
ANION GAP: 6 (ref 5–15)
AST: 39 U/L (ref 15–41)
Alkaline Phosphatase: 72 U/L (ref 38–126)
BUN: 14 mg/dL (ref 6–20)
CHLORIDE: 104 mmol/L (ref 101–111)
CO2: 27 mmol/L (ref 22–32)
Calcium: 8 mg/dL — ABNORMAL LOW (ref 8.9–10.3)
Creatinine, Ser: 0.7 mg/dL (ref 0.44–1.00)
GFR calc Af Amer: >60 mL/min (ref >60)
GFR calc non Af Amer: >60 mL/min (ref >60)
GLUCOSE: 84 mg/dL (ref 65–99)
POTASSIUM: 3.6 mmol/L (ref 3.5–5.1)
SODIUM: 137 mmol/L (ref 135–145)
Total Bilirubin: 0.5 mg/dL (ref 0.3–1.2)
Total Protein: 6.3 g/dL — ABNORMAL LOW (ref 6.5–8.1)

## 2017-04-20 LAB — I-STAT TROPONIN, ED
TROPONIN I, POC: 0 ng/mL (ref 0.00–0.08)
Troponin i, poc: 0 ng/mL (ref 0.00–0.08)

## 2017-04-20 LAB — LIPASE, BLOOD: Lipase: 27 U/L (ref 11–51)

## 2017-04-20 MED ORDER — FAMOTIDINE 20 MG PO TABS: 20.0000 mg | ORAL_TABLET | Freq: Two times a day (BID) | ORAL | 0 refills | Status: DC

## 2017-04-20 MED ORDER — PHENYTOIN SODIUM EXTENDED 100 MG PO CAPS
100.0000 mg | ORAL_CAPSULE | Freq: Once | ORAL | Status: AC
  Administered 2017-04-20: 100 mg via ORAL
  Filled 2017-04-20: qty 1

## 2017-04-20 MED ORDER — ACETAMINOPHEN 325 MG PO TABS
650.0000 mg | ORAL_TABLET | Freq: Once | ORAL | Status: AC
  Administered 2017-04-20: 650 mg via ORAL
  Filled 2017-04-20: qty 2

## 2017-04-20 MED ORDER — SODIUM CHLORIDE 0.9 % IV BOLUS (SEPSIS)
1000.0000 mL | Freq: Once | INTRAVENOUS | Status: AC
  Administered 2017-04-20: 1000 mL via INTRAVENOUS

## 2017-04-20 MED ORDER — GI COCKTAIL ~~LOC~~
30.0000 mL | Freq: Once | ORAL | Status: AC
  Administered 2017-04-20: 30 mL via ORAL
  Filled 2017-04-20: qty 30

## 2017-04-20 NOTE — ED Notes (Signed)
Patient transported to X-ray 

## 2017-04-20 NOTE — Discharge Instructions (Signed)
Please read the attached information regarding your condition. Take Pepcid as needed for reflux.  Take Tylenol or ibuprofen as needed for pain. Follow-up with cardiologist listed below for further evaluation. Follow-up with your PCP for further evaluation. Continue your other home medications as previously prescribed. Return to ED for worsening chest pain, injuries, falls, trouble breathing, coughing up blood, leg swelling.

## 2017-04-20 NOTE — ED Provider Notes (Signed)
[facility] EMERGENCY DEPARTMENT Provider Note   CSN: [id number] Arrival date & time: 04/20/17  1510     History   Chief Complaint Chief Complaint  Patient presents with  . Chest Pain    HPI Heather Perry is a 51 y.o. female with a past medical history anxiety, GERD, hypertension, seizures, who presents to ED for evaluation of 4-day history of central sharp chest pain/epigastricpain. The pain has been intermittent and does not radiate.  She states that there are no known aggravating or alleviating factors.  She reports similar symptoms in the past due to her GERD however she states that "is never been this bad, and I always take my medication."  She also reports associated generalized fatigue.  She has been experiencing some upper URI symptoms including sneezing, congestion and runny nose.  She denies any nausea, vomiting, bowel changes, urinary symptoms, hemoptysis, prior MI, DVT, PE, recent surgery, history of cancer, estrogen use. She denies any alcohol, tobacco, drug use.  HPI  Past Medical History:  Diagnosis Date  . Allergy   . Anxiety   . Arthritis   . Asthma   . Blood transfusion without reported diagnosis   . Depression   . GERD (gastroesophageal reflux disease)   . High cholesterol   . Hypertension   . Obesity   . Osteoporosis   . Recurrent boils   . Seizures ([place])    last seizure "3 years ago" per pt 06-16-16 KBW  . Sleep apnea    no CPAP used  . Uterine fibroid     Patient Active Problem List   Diagnosis Date Noted  . 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 Rash and nonspecific skin eruption 04/13/2017    Past Surgical History:  Procedure Laterality Date  . ABDOMINAL HYSTERECTOMY    . CESAREAN SECTION    . KNEE SURGERY Right    x2  . UPPER GASTROINTESTINAL ENDOSCOPY      OB History    No data available       Home Medications    Prior to Admission medications   Medication  Sig Start Date End Date Taking? Authorizing Provider  acyclovir ointment (ZOVIRAX) 5 % Apply 1 application topically every 3 (three) hours. 05/18/16  Yes Micheline Chapman, NP  albuterol (PROVENTIL HFA;VENTOLIN HFA) 108 (90 Base) MCG/ACT inhaler Inhale 2 puffs into the lungs every 6 (six) hours as needed for wheezing or shortness of breath. 05/18/16  Yes Micheline Chapman, NP  aspirin 325 MG EC tablet TAKE 1 TABLET (325 MG TOTAL) BY MOUTH DAILY. 09/12/16  Yes Dorena Dew, FNP  budesonide-formoterol (SYMBICORT) 160-4.5 MCG/ACT inhaler Inhale 2 puffs into the lungs 2 (two) times daily. Patient taking differently: Inhale 2 puffs into the lungs 2 (two) times daily as needed (wheezing, sob).  05/18/16  Yes Micheline Chapman, NP  citalopram (CELEXA) 40 MG tablet Take 1 tablet (40 mg total) by mouth daily. 05/30/16  Yes Micheline Chapman, NP  diazepam (VALIUM) 5 MG tablet Take 5 mg by mouth every 6 (six) hours as needed for anxiety.   Yes [provider]  ergocalciferol (VITAMIN D2) 50000 units capsule Take 1 capsule (50,000 Units total) by mouth once a week. Patient taking differently: Take 50,000 Units by mouth 2 (two) times a week.  05/18/16  Yes Micheline Chapman, NP  esomeprazole (NEXIUM) 40 MG capsule Take 1 capsule (40 mg total) by mouth daily at 12 noon. 05/18/16  Yes Micheline Chapman, NP  gabapentin (NEURONTIN) 300 MG capsule Take 2 capsules (600 mg total) by mouth 3 (three) times daily. 03/08/17  Yes Clent Demark, PA-C  hydrochlorothiazide (HYDRODIURIL) 25 MG tablet Take 1 tablet (25 mg total) by mouth daily. 05/18/16  Yes Micheline Chapman, NP  ipratropium-albuterol (DUONEB) 0.5-2.5 (3) MG/3ML SOLN Take 3 mLs by nebulization every 6 (six) hours as needed. 05/18/16  Yes Micheline Chapman, NP  mupirocin cream (BACTROBAN) 2 % Apply 1 application topically 2 (two) times daily. To breast 04/13/17  Yes Riccio, Angela C, DO  oxyCODONE (ROXICODONE) 15 MG immediate release  tablet Take 15 mg by mouth every 4 (four) hours as needed for pain.   Yes [provider]  phenytoin (DILANTIN) 100 MG ER capsule Take 1 capsule (100 mg total) by mouth 3 (three) times daily. 05/18/16  Yes Micheline Chapman, NP  pravastatin (PRAVACHOL) 20 MG tablet Take 1 tablet (20 mg total) by mouth daily. 05/18/16  Yes Micheline Chapman, NP  triamcinolone cream (KENALOG) 0.1 % Apply topically 2 (two) times daily. Patient taking differently: Apply 1 application topically 2 (two) times daily as needed (rash).  04/13/17  Yes Lucila Maine C, DO  celecoxib (CELEBREX) 200 MG capsule Take 1 capsule (200 mg total) by mouth 2 (two) times daily. Patient not taking: Reported on 04/20/2017 05/18/16   Micheline Chapman, NP  chlorhexidine (HIBICLENS) 4 % external liquid Apply topically daily as needed. Patient not taking: Reported on 04/20/2017 12/12/16   Clent Demark, PA-C  famotidine (PEPCID) 20 MG tablet Take 1 tablet (20 mg total) by mouth 2 (two) times daily. 04/20/17   Delia Heady, PA-C  Misc. Devices (STEP N REST II WALKER) MISC 1 each by Does not apply route daily. 03/08/17   Clent Demark, PA-C  potassium chloride SA (K-DUR,KLOR-CON) 20 MEQ tablet Take 1 tablet (20 mEq total) by mouth 2 (two) times daily. Patient not taking: Reported on 04/20/2017 05/18/16   Micheline Chapman, NP  sulfamethoxazole-trimethoprim (BACTRIM DS) 800-160 MG tablet Take 1 tablet by mouth 2 (two) times daily. Patient not taking: Reported on 04/20/2017 12/12/16   Clent Demark, PA-C  thiamine (VITAMIN B-1) 100 MG tablet Take 1 tablet (100 mg total) by mouth daily. Patient not taking: Reported on 04/20/2017 05/18/16   Micheline Chapman, NP    Family History Family History  Problem Relation Age of Onset  . Colon cancer Neg Hx   . Esophageal cancer Neg Hx   . Rectal cancer Neg Hx   . Stomach cancer Neg Hx     Social History Social History  Substance Use Topics  . Smoking status: Never Smoker    . Smokeless tobacco: Never Used  . Alcohol use No     Allergies   Lortab [hydrocodone-acetaminophen] and Penicillins   Review of Systems Review of Systems  Constitutional: Positive for appetite change, chills and fatigue. Negative for fever.  HENT: Positive for congestion, rhinorrhea and sneezing. Negative for ear pain and sore throat.   Eyes: Negative for photophobia and visual disturbance.  Respiratory: Positive for cough. Negative for chest tightness, shortness of breath and wheezing.   Cardiovascular: Positive for chest pain. Negative for palpitations.  Gastrointestinal: Positive for abdominal pain. Negative for blood in stool, constipation, diarrhea, nausea and vomiting.  Genitourinary: Negative for dysuria, hematuria and urgency.  Musculoskeletal: Negative for myalgias.  Skin: Negative for rash.  Neurological: Negative for dizziness, weakness and light-headedness.  Physical Exam Updated Vital Signs BP 123/70   Pulse 74   Temp 97.8 F (36.6 C) (Oral)   Resp (!) 21   SpO2 100%   Physical Exam  Constitutional: She appears well-developed and well-nourished. No distress.  HENT:  Head: Normocephalic and atraumatic.  Nose: Nose normal.  Eyes: Conjunctivae and EOM are normal. Left eye exhibits no discharge. No scleral icterus.  Neck: Normal range of motion. Neck supple.  Cardiovascular: Normal rate, regular rhythm, normal heart sounds and intact distal pulses.  Exam reveals no gallop and no friction rub.   No murmur heard. Pulmonary/Chest: Effort normal and breath sounds normal. No respiratory distress. She exhibits tenderness.    Abdominal: Soft. Bowel sounds are normal. She exhibits no distension. There is tenderness. There is no guarding.  Musculoskeletal: Normal range of motion. She exhibits no edema or tenderness.  No lower extremity edema or calf tenderness bilaterally.  Neurological: She is alert. She exhibits normal muscle tone. Coordination normal.  Skin:  Skin is warm and dry. No rash noted.  Psychiatric: She has a normal mood and affect.  Nursing note and vitals reviewed.    ED Treatments / Results  Labs (all labs ordered are listed, but only abnormal results are displayed) Labs Reviewed  COMPREHENSIVE METABOLIC PANEL - Abnormal; Notable for the following:       Result Value   Calcium 8.0 (*)    Total Protein 6.3 (*)    All other components within normal limits  LIPASE, BLOOD  CBC WITH DIFFERENTIAL/PLATELET  I-STAT TROPONIN, ED  I-STAT TROPONIN, ED    EKG  EKG Interpretation  Date/Time:  Thursday April 20 2017 15:13:42 EDT Ventricular Rate:  76 PR Interval:  160 QRS Duration: 82 QT Interval:  398 QTC Calculation: 447 R Axis:   64 Text Interpretation:  Normal sinus rhythm Normal ECG No old tracing to compare Confirmed by Deno Etienne [phone]) on 04/20/2017 3:28:30 PM       Radiology Dg Chest 2 View  Result Date: 04/20/2017 CLINICAL DATA:  51 year old female with a history of chest pain EXAM: CHEST  2 VIEW COMPARISON:  01/06/2017 FINDINGS: Cardiomediastinal silhouette unchanged in size and contour. No evidence of central vascular congestion. No pneumothorax or pleural effusion. No confluent airspace disease. No displaced fracture IMPRESSION: Negative for acute cardiopulmonary disease Electronically Signed   By: Corrie Mckusick D.O.   On: 04/20/2017 15:55    Procedures Procedures (including critical care time)  Medications Ordered in ED Medications  phenytoin (DILANTIN) ER capsule 100 mg (100 mg Oral Given 04/20/17 1645)  gi cocktail (Maalox,Lidocaine,Donnatal) (30 mLs Oral Given 04/20/17 1636)  sodium chloride 0.9 % bolus 1,000 mL (0 mLs Intravenous Stopped 04/20/17 1948)  acetaminophen (TYLENOL) tablet 650 mg (650 mg Oral Given 04/20/17 1838)     Initial Impression / Assessment and Plan / ED Course  I have reviewed the triage vital signs and the nursing notes.  Pertinent labs & imaging results that were available  during my care of the patient were reviewed by me and considered in my medical decision making (see chart for details).     Patient, with a past medical history of anxiety, GERD, hypertension, seizures, presents to ED for evaluation of 4-day history of central sharp chest pain, epigastric pain.  Pain is been intermittent and does not radiate.  Reports similar symptoms in the past due to her GERD.  She denies any hemoptysis, leg swelling, prior cardiac history, recent surgery, prior PE or DVT, history of cancer,  trouble breathing.  On physical exam patient is nontoxic-appearing and in no acute distress.  She does have reproducible chest tenderness on my examination as well as epigastric tenderness.  Oxygen saturations 96-100% on room air.  She is not tachycardic or tachypneic.  Blood pressure normal.  Initial troponin was negative.  EKG showed normal sinus rhythm.  Chest x-ray unremarkable.  CMP, CBC, lipase unremarkable.  She reports much improvement in her symptoms with GI cocktail and Tylenol given here in the ED.  She does report a headache after nitro that was given by EMS.  She is a low risk by her HEART score.  She has a Wells score of 0.  Dose of troponin returned as negative.  I have low suspicion for cardiac or pulmonary cause of her chest pain.  Based on the reproducibility of it and the improvement with GI cocktail I have more suspicion for chest wall pain and gastritis.  Will advise her to continue her home medications as previously prescribed.  We will give H2 blocker for added support.  Will refer her to cardiologist for outpatient evaluation. Patient appears stable for discharge at this time. Strict return precautions given.  Final Clinical Impressions(s) / ED Diagnoses   Final diagnoses:  Chest wall pain  Chronic gastritis without bleeding, unspecified gastritis type    New Prescriptions New Prescriptions   FAMOTIDINE (PEPCID) 20 MG TABLET    Take 1 tablet (20 mg total) by mouth 2 (two)  times daily.     Delia Heady, PA-C 04/20/17 [place], [place], DO 04/20/17 2001

## 2017-04-20 NOTE — ED Notes (Signed)
Pt stable, ambulatory, states understanding of discharge instructions 

## 2017-04-20 NOTE — ED Triage Notes (Signed)
Pt arrived via [facility] c/o central chest pain starting Monday, non radiating.  EMS gave 324 ASA, 1 SL Nitro, pain 5/10. Pt states she has bad GERD and thought it was reflux.

## 2017-04-24 ENCOUNTER — Other Ambulatory Visit: Payer: Self-pay | Admitting: Family Medicine

## 2017-04-24 DIAGNOSIS — Z139 Encounter for screening, unspecified: Secondary | ICD-10-CM

## 2017-04-25 ENCOUNTER — Ambulatory Visit (INDEPENDENT_AMBULATORY_CARE_PROVIDER_SITE_OTHER): Payer: Medicaid Other | Admitting: Physician Assistant

## 2017-04-26 NOTE — Progress Notes (Deleted)
Psychiatric Initial Adult Assessment   Patient Identification: Heather Perry MRN:  [id number] Date of Evaluation:  04/26/2017 Referral Source: *** Chief Complaint:   Visit Diagnosis: No diagnosis found.  History of Present Illness:   Heather Perry is a 51 year old female with anxiety, depression, asthma, Sjogren's syndrome without extraglandular involvement, hypertension, GERD, seizure, who is referred for depression.   Associated Signs/Symptoms: Depression Symptoms:  {DEPRESSION SYMPTOMS:20000} (Hypo) Manic Symptoms:  {BHH MANIC SYMPTOMS:22872} Anxiety Symptoms:  {BHH ANXIETY SYMPTOMS:22873} Psychotic Symptoms:  {BHH PSYCHOTIC SYMPTOMS:22874} PTSD Symptoms: {BHH PTSD SYMPTOMS:22875}  Past Psychiatric History:  Outpatient:  Psychiatry admission:  Previous suicide attempt:  Past trials of medication:  History of violence:   Previous Psychotropic Medications: {YES/NO:21197}  Substance Abuse History in the last 12 months:  {yes no:314532}  Consequences of Substance Abuse: {BHH CONSEQUENCES OF SUBSTANCE ABUSE:22880}  Past Medical History:  Past Medical History:  Diagnosis Date  . Allergy   . Anxiety   . Arthritis   . Asthma   . Blood transfusion without reported diagnosis   . Depression   . GERD (gastroesophageal reflux disease)   . High cholesterol   . Hypertension   . Obesity   . Osteoporosis   . Recurrent boils   . Seizures ([place])    last seizure "3 years ago" per pt 06-16-16 KBW  . Sleep apnea    no CPAP used  . Uterine fibroid     Past Surgical History:  Procedure Laterality Date  . ABDOMINAL HYSTERECTOMY    . CESAREAN SECTION    . KNEE SURGERY Right    x2  . UPPER GASTROINTESTINAL ENDOSCOPY      Family Psychiatric History: ***  Family History:  Family History  Problem Relation Age of Onset  . Colon cancer Neg Hx   . Esophageal cancer Neg Hx   . Rectal cancer Neg Hx   . Stomach cancer Neg Hx     Social History:   Social History    Socioeconomic History  . Marital status: Single    Spouse name: Not on file  . Number of children: Not on file  . Years of education: Not on file  . Highest education level: Not on file  Social Needs  . Financial resource strain: Not on file  . Food insecurity - worry: Not on file  . Food insecurity - inability: Not on file  . Transportation needs - medical: Not on file  . Transportation needs - non-medical: Not on file  Occupational History  . Not on file  Tobacco Use  . Smoking status: Never Smoker  . Smokeless tobacco: Never Used  Substance and Sexual Activity  . Alcohol use: No  . Drug use: Yes    Types: "Crack" cocaine    Comment: last smoked 2011  . Sexual activity: Not on file  Other Topics Concern  . Not on file  Social History Narrative  . Not on file    Additional Social History: ***  Allergies:   Allergies  Allergen Reactions  . Lortab [Hydrocodone-Acetaminophen]     Started itching  . Penicillins Itching    Metabolic Disorder Labs: Lab Results  Component Value Date   HGBA1C 5.3 04/25/2016   MPG 105 04/25/2016   No results found for: PROLACTIN Lab Results  Component Value Date   CHOL 173 01/11/2017   TRIG 100 01/11/2017   HDL 74 01/11/2017   CHOLHDL 2.3 01/11/2017   VLDL 20 04/25/2016   LDLCALC 79 01/11/2017   LDLCALC  85 04/25/2016     Current Medications: Current Outpatient Medications  Medication Sig Dispense Refill  . acyclovir ointment (ZOVIRAX) 5 % Apply 1 application topically every 3 (three) hours. 15 g 1  . albuterol (PROVENTIL HFA;VENTOLIN HFA) 108 (90 Base) MCG/ACT inhaler Inhale 2 puffs into the lungs every 6 (six) hours as needed for wheezing or shortness of breath. 1 Inhaler 1  . aspirin 325 MG EC tablet TAKE 1 TABLET (325 MG TOTAL) BY MOUTH DAILY. 90 tablet 1  . budesonide-formoterol (SYMBICORT) 160-4.5 MCG/ACT inhaler Inhale 2 puffs into the lungs 2 (two) times daily. (Patient taking differently: Inhale 2 puffs into the  lungs 2 (two) times daily as needed (wheezing, sob). ) 1 Inhaler 1  . celecoxib (CELEBREX) 200 MG capsule Take 1 capsule (200 mg total) by mouth 2 (two) times daily. (Patient not taking: Reported on 04/20/2017) 90 capsule 1  . chlorhexidine (HIBICLENS) 4 % external liquid Apply topically daily as needed. (Patient not taking: Reported on 04/20/2017) 120 mL 0  . citalopram (CELEXA) 40 MG tablet Take 1 tablet (40 mg total) by mouth daily. 90 tablet 1  . diazepam (VALIUM) 5 MG tablet Take 5 mg by mouth every 6 (six) hours as needed for anxiety.    . ergocalciferol (VITAMIN D2) 50000 units capsule Take 1 capsule (50,000 Units total) by mouth once a week. (Patient taking differently: Take 50,000 Units by mouth 2 (two) times a week. ) 12 capsule 1  . esomeprazole (NEXIUM) 40 MG capsule Take 1 capsule (40 mg total) by mouth daily at 12 noon. 90 capsule 1  . famotidine (PEPCID) 20 MG tablet Take 1 tablet (20 mg total) by mouth 2 (two) times daily. 30 tablet 0  . gabapentin (NEURONTIN) 300 MG capsule Take 2 capsules (600 mg total) by mouth 3 (three) times daily. 180 capsule 3  . hydrochlorothiazide (HYDRODIURIL) 25 MG tablet Take 1 tablet (25 mg total) by mouth daily. 90 tablet 1  . ipratropium-albuterol (DUONEB) 0.5-2.5 (3) MG/3ML SOLN Take 3 mLs by nebulization every 6 (six) hours as needed. 360 mL 1  . Misc. Devices (STEP N REST II WALKER) MISC 1 each by Does not apply route daily. 1 each 0  . mupirocin cream (BACTROBAN) 2 % Apply 1 application topically 2 (two) times daily. To breast 15 g 0  . oxyCODONE (ROXICODONE) 15 MG immediate release tablet Take 15 mg by mouth every 4 (four) hours as needed for pain.    . phenytoin (DILANTIN) 100 MG ER capsule Take 1 capsule (100 mg total) by mouth 3 (three) times daily. 90 capsule 5  . potassium chloride SA (K-DUR,KLOR-CON) 20 MEQ tablet Take 1 tablet (20 mEq total) by mouth 2 (two) times daily. (Patient not taking: Reported on 04/20/2017) 30 tablet 5  . pravastatin  (PRAVACHOL) 20 MG tablet Take 1 tablet (20 mg total) by mouth daily. 90 tablet 1  . sulfamethoxazole-trimethoprim (BACTRIM DS) 800-160 MG tablet Take 1 tablet by mouth 2 (two) times daily. (Patient not taking: Reported on 04/20/2017) 20 tablet 0  . thiamine (VITAMIN B-1) 100 MG tablet Take 1 tablet (100 mg total) by mouth daily. (Patient not taking: Reported on 04/20/2017) 90 tablet 1  . triamcinolone cream (KENALOG) 0.1 % Apply topically 2 (two) times daily. (Patient taking differently: Apply 1 application topically 2 (two) times daily as needed (rash). ) 30 g 2   Current Facility-Administered Medications  Medication Dose Route Frequency Provider Last Rate Last Dose  . 0.9 %  sodium  chloride infusion  500 mL Intravenous Continuous Nandigam, Venia Minks, MD        Neurologic: Headache: No Seizure: No Paresthesias:No  Musculoskeletal: Strength & Muscle Tone: within normal limits Gait & Station: normal Patient leans: N/A  Psychiatric Specialty Exam: ROS  There were no vitals taken for this visit.There is no height or weight on file to calculate BMI.  General Appearance: Fairly Groomed  Eye Contact:  Good  Speech:  Clear and Coherent  Volume:  Normal  Mood:  {BHH MOOD:22306}  Affect:  {Affect (PAA):22687}  Thought Process:  Coherent and Goal Directed  Orientation:  Full (Time, Place, and Person)  Thought Content:  Logical  Suicidal Thoughts:  {ST/HT (PAA):22692}  Homicidal Thoughts:  {ST/HT (PAA):22692}  Memory:  Immediate;   Good Recent;   Good Remote;   Good  Judgement:  {Judgement (PAA):22694}  Insight:  {Insight (PAA):22695}  Psychomotor Activity:  Normal  Concentration:  Concentration: Good and Attention Span: Good  Recall:  Good  Fund of Knowledge:Good  Language: Good  Akathisia:  No  Handed:  Right  AIMS (if indicated):  N/A  Assets:  Communication Skills Desire for Improvement  ADL's:  Intact  Cognition: WNL  Sleep:  ***   Assessment  Plan  The patient  demonstrates the following risk factors for suicide: Chronic risk factors for suicide include: {Chronic Risk Factors for YEMVVKP:22449753}. Acute risk factors for suicide include: {Acute Risk Factors for YYFRTMY:11173567}. Protective factors for this patient include: {Protective Factors for Suicide OLID:03013143}. Considering these factors, the overall suicide risk at this point appears to be {Desc; low/moderate/high:110033}. Patient {ACTION; IS/IS OOI:75797282} appropriate for outpatient follow up.   Treatment Plan Summary: Plan as above   Norman Clay, MD 11/7/201812:15 PM

## 2017-05-03 ENCOUNTER — Ambulatory Visit (HOSPITAL_COMMUNITY): Payer: Medicaid Other | Admitting: Psychiatry

## 2017-05-03 ENCOUNTER — Telehealth (HOSPITAL_COMMUNITY): Payer: Self-pay | Admitting: *Deleted

## 2017-05-03 NOTE — Telephone Encounter (Signed)
Patient called this AM stating that her Scheduled Transportation is running late. And if she could still come in?  patient stated that she lives in [place] & it would be 20-30 minutes before arrival. Notified Dr Modesta Messing & per her   patient needs to be rescheduled for a new appointment.

## 2017-05-10 ENCOUNTER — Ambulatory Visit: Payer: Medicaid Other | Admitting: Cardiovascular Disease

## 2017-05-22 ENCOUNTER — Ambulatory Visit
Admission: RE | Admit: 2017-05-22 | Discharge: 2017-05-22 | Disposition: A | Discharge status: Home / Self Care | Payer: Medicaid Other | Source: Ambulatory Visit | Attending: Family Medicine | Admitting: Family Medicine

## 2017-05-22 DIAGNOSIS — Z139 Encounter for screening, unspecified: Secondary | ICD-10-CM

## 2017-05-23 ENCOUNTER — Other Ambulatory Visit: Payer: Self-pay | Admitting: Family Medicine

## 2017-05-23 DIAGNOSIS — R21 Rash and other nonspecific skin eruption: Secondary | ICD-10-CM

## 2017-05-24 NOTE — Progress Notes (Deleted)
Psychiatric Initial Adult Assessment   Patient Identification: Heather Perry MRN:  [id number] Date of Evaluation:  05/24/2017 Referral Source: Domenica Fail, PA Chief Complaint:   Visit Diagnosis: No diagnosis found.  History of Present Illness:   Heather Perry is a 51 y.o. year old female with a history of depression, anxiety, asthma, Sjogren's syndrome without extraglandular involvement, hypertension, GERD, seizure, who presents for follow up appointment for No diagnosis found.   Associated Signs/Symptoms: Depression Symptoms:  {DEPRESSION SYMPTOMS:20000} (Hypo) Manic Symptoms:  {BHH MANIC SYMPTOMS:22872} Anxiety Symptoms:  {BHH ANXIETY SYMPTOMS:22873} Psychotic Symptoms:  {BHH PSYCHOTIC SYMPTOMS:22874} PTSD Symptoms: {BHH PTSD SYMPTOMS:22875}  Past Psychiatric History: ***  Previous Psychotropic Medications: {YES/NO:21197}  Substance Abuse History in the last 12 months:  {yes no:314532}  Consequences of Substance Abuse: {BHH CONSEQUENCES OF SUBSTANCE ABUSE:22880}  Past Medical History:  Past Medical History:  Diagnosis Date  . Allergy   . Anxiety   . Arthritis   . Asthma   . Blood transfusion without reported diagnosis   . Depression   . GERD (gastroesophageal reflux disease)   . High cholesterol   . Hypertension   . Obesity   . Osteoporosis   . Recurrent boils   . Seizures ([place])    last seizure "3 years ago" per pt 06-16-16 KBW  . Sleep apnea    no CPAP used  . Uterine fibroid     Past Surgical History:  Procedure Laterality Date  . ABDOMINAL HYSTERECTOMY    . CESAREAN SECTION    . KNEE SURGERY Right    x2  . UPPER GASTROINTESTINAL ENDOSCOPY      Family Psychiatric History: ***  Family History:  Family History  Problem Relation Age of Onset  . Breast cancer Maternal Aunt        unsure how old of onset  . Breast cancer Maternal Aunt        unsure how old of onset  . Colon cancer Neg Hx   . Esophageal cancer Neg Hx   . Rectal cancer Neg Hx   .  Stomach cancer Neg Hx     Social History:   Social History   Socioeconomic History  . Marital status: Single    Spouse name: Not on file  . Number of children: Not on file  . Years of education: Not on file  . Highest education level: Not on file  Social Needs  . Financial resource strain: Not on file  . Food insecurity - worry: Not on file  . Food insecurity - inability: Not on file  . Transportation needs - medical: Not on file  . Transportation needs - non-medical: Not on file  Occupational History  . Not on file  Tobacco Use  . Smoking status: Never Smoker  . Smokeless tobacco: Never Used  Substance and Sexual Activity  . Alcohol use: No  . Drug use: Yes    Types: "Crack" cocaine    Comment: last smoked 2011  . Sexual activity: Not on file  Other Topics Concern  . Not on file  Social History Narrative  . Not on file    Additional Social History: ***  Allergies:   Allergies  Allergen Reactions  . Lortab [Hydrocodone-Acetaminophen]     Started itching  . Penicillins Itching    Metabolic Disorder Labs: Lab Results  Component Value Date   HGBA1C 5.3 04/25/2016   MPG 105 04/25/2016   No results found for: PROLACTIN Lab Results  Component Value Date   CHOL 173  01/11/2017   TRIG 100 01/11/2017   HDL 74 01/11/2017   CHOLHDL 2.3 01/11/2017   VLDL 20 04/25/2016   LDLCALC 79 01/11/2017   LDLCALC 85 04/25/2016     Current Medications: Current Outpatient Medications  Medication Sig Dispense Refill  . acyclovir ointment (ZOVIRAX) 5 % Apply 1 application topically every 3 (three) hours. 15 g 1  . albuterol (PROVENTIL HFA;VENTOLIN HFA) 108 (90 Base) MCG/ACT inhaler Inhale 2 puffs into the lungs every 6 (six) hours as needed for wheezing or shortness of breath. 1 Inhaler 1  . aspirin 325 MG EC tablet TAKE 1 TABLET (325 MG TOTAL) BY MOUTH DAILY. 90 tablet 1  . budesonide-formoterol (SYMBICORT) 160-4.5 MCG/ACT inhaler Inhale 2 puffs into the lungs 2 (two) times  daily. (Patient taking differently: Inhale 2 puffs into the lungs 2 (two) times daily as needed (wheezing, sob). ) 1 Inhaler 1  . celecoxib (CELEBREX) 200 MG capsule Take 1 capsule (200 mg total) by mouth 2 (two) times daily. (Patient not taking: Reported on 04/20/2017) 90 capsule 1  . chlorhexidine (HIBICLENS) 4 % external liquid Apply topically daily as needed. (Patient not taking: Reported on 04/20/2017) 120 mL 0  . citalopram (CELEXA) 40 MG tablet Take 1 tablet (40 mg total) by mouth daily. 90 tablet 1  . diazepam (VALIUM) 5 MG tablet Take 5 mg by mouth every 6 (six) hours as needed for anxiety.    . ergocalciferol (VITAMIN D2) 50000 units capsule Take 1 capsule (50,000 Units total) by mouth once a week. (Patient taking differently: Take 50,000 Units by mouth 2 (two) times a week. ) 12 capsule 1  . esomeprazole (NEXIUM) 40 MG capsule Take 1 capsule (40 mg total) by mouth daily at 12 noon. 90 capsule 1  . famotidine (PEPCID) 20 MG tablet Take 1 tablet (20 mg total) by mouth 2 (two) times daily. 30 tablet 0  . gabapentin (NEURONTIN) 300 MG capsule Take 2 capsules (600 mg total) by mouth 3 (three) times daily. 180 capsule 3  . hydrochlorothiazide (HYDRODIURIL) 25 MG tablet Take 1 tablet (25 mg total) by mouth daily. 90 tablet 1  . ipratropium-albuterol (DUONEB) 0.5-2.5 (3) MG/3ML SOLN Take 3 mLs by nebulization every 6 (six) hours as needed. 360 mL 1  . Misc. Devices (STEP N REST II WALKER) MISC 1 each by Does not apply route daily. 1 each 0  . mupirocin cream (BACTROBAN) 2 % Apply 1 application topically 2 (two) times daily. To breast 15 g 0  . oxyCODONE (ROXICODONE) 15 MG immediate release tablet Take 15 mg by mouth every 4 (four) hours as needed for pain.    . phenytoin (DILANTIN) 100 MG ER capsule Take 1 capsule (100 mg total) by mouth 3 (three) times daily. 90 capsule 5  . potassium chloride SA (K-DUR,KLOR-CON) 20 MEQ tablet Take 1 tablet (20 mEq total) by mouth 2 (two) times daily. (Patient not  taking: Reported on 04/20/2017) 30 tablet 5  . pravastatin (PRAVACHOL) 20 MG tablet Take 1 tablet (20 mg total) by mouth daily. 90 tablet 1  . sulfamethoxazole-trimethoprim (BACTRIM DS) 800-160 MG tablet Take 1 tablet by mouth 2 (two) times daily. (Patient not taking: Reported on 04/20/2017) 20 tablet 0  . thiamine (VITAMIN B-1) 100 MG tablet Take 1 tablet (100 mg total) by mouth daily. (Patient not taking: Reported on 04/20/2017) 90 tablet 1  . triamcinolone cream (KENALOG) 0.1 % Apply topically 2 (two) times daily. 30 g 2   Current Facility-Administered Medications  Medication  Dose Route Frequency Provider Last Rate Last Dose  . 0.9 %  sodium chloride infusion  500 mL Intravenous Continuous Nandigam, Venia Minks, MD        Neurologic: Headache: No Seizure: No Paresthesias:No  Musculoskeletal: Strength & Muscle Tone: within normal limits Gait & Station: normal Patient leans: N/A  Psychiatric Specialty Exam: ROS  There were no vitals taken for this visit.There is no height or weight on file to calculate BMI.  General Appearance: Fairly Groomed  Eye Contact:  Good  Speech:  Clear and Coherent  Volume:  Normal  Mood:  {BHH MOOD:22306}  Affect:  {Affect (PAA):22687}  Thought Process:  Coherent and Goal Directed  Orientation:  Full (Time, Place, and Person)  Thought Content:  Logical  Suicidal Thoughts:  {ST/HT (PAA):22692}  Homicidal Thoughts:  {ST/HT (PAA):22692}  Memory:  Immediate;   Good Recent;   Good Remote;   Good  Judgement:  {Judgement (PAA):22694}  Insight:  {Insight (PAA):22695}  Psychomotor Activity:  Normal  Concentration:  Concentration: Good and Attention Span: Good  Recall:  Good  Fund of Knowledge:Good  Language: Good  Akathisia:  No  Handed:  Right  AIMS (if indicated):  N/A  Assets:  Communication Skills Desire for Improvement  ADL's:  Intact  Cognition: WNL  Sleep:  ***   Assessment  Plan  The patient demonstrates the following risk factors for  suicide: Chronic risk factors for suicide include: {Chronic Risk Factors for ZOXWRUE:45409811}. Acute risk factors for suicide include: {Acute Risk Factors for BJYNWGN:56213086}. Protective factors for this patient include: {Protective Factors for Suicide VHQI:69629528}. Considering these factors, the overall suicide risk at this point appears to be {Desc; low/moderate/high:110033}. Patient {ACTION; IS/IS UXL:24401027} appropriate for outpatient follow up.   Treatment Plan Summary: Plan as above   Norman Clay, MD 12/5/20183:16 PM

## 2017-05-29 ENCOUNTER — Ambulatory Visit (HOSPITAL_COMMUNITY): Payer: Medicaid Other | Admitting: Psychiatry

## 2017-06-01 ENCOUNTER — Other Ambulatory Visit (INDEPENDENT_AMBULATORY_CARE_PROVIDER_SITE_OTHER): Payer: Self-pay | Admitting: Physician Assistant

## 2017-06-01 DIAGNOSIS — M5136 Other intervertebral disc degeneration, lumbar region: Secondary | ICD-10-CM

## 2017-06-01 NOTE — Telephone Encounter (Signed)
FWD to PCP. Tempestt S Roberts, CMA  

## 2017-06-02 ENCOUNTER — Ambulatory Visit: Payer: Medicaid Other | Admitting: Cardiovascular Disease

## 2017-06-26 ENCOUNTER — Ambulatory Visit: Payer: Medicaid Other | Admitting: Cardiology

## 2017-06-26 ENCOUNTER — Encounter: Payer: Self-pay | Admitting: Cardiology

## 2017-06-26 VITALS — BP 122/80 | HR 76 | Ht 62.0 in | Wt 223.6 lb

## 2017-06-26 DIAGNOSIS — I10 Essential (primary) hypertension: Secondary | ICD-10-CM | POA: Diagnosis not present

## 2017-06-26 DIAGNOSIS — R079 Chest pain, unspecified: Secondary | ICD-10-CM

## 2017-06-26 DIAGNOSIS — E785 Hyperlipidemia, unspecified: Secondary | ICD-10-CM

## 2017-06-26 NOTE — Patient Instructions (Addendum)
NO MEDICATION CHANGES  If test is abnormal, another test will need to be completed and a follow up appointment will be schedule. If normal follow on an as needed basis.   SCHEDULE AT [facility] Your physician has requested that you have an exercise tolerance test. For further information please visit [email or web address]. Please also follow instruction sheet, as given.
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
## 2017-06-26 NOTE — Progress Notes (Signed)
PCP: Helane Rima, MD  Clinic Note: Chief Complaint  Patient presents with  . Hospitalization Follow-up     pt denied chest pain    HPI: Heather Perry is a 52 y.o. female with a PMH below (most notably history of Sjogren's, hypertension and hyperlipidemia on statin) who presents today for delayed emergency room follow-up for chest discomfort.  As far as I can tell, the last time she went to the emergency room was on April 20, 2017. --Her presentation was a 4-day history of continuous sharp epigastric/central chest pain.  It was usually made worse with eating.  Was also made worse with lying.  She said the symptoms lasted pretty much all day and got worse with certain things, but not necessarily worse with exertion.  She felt that the symptoms are consistent with GERD. Her symptoms improved with a GI cocktail.  She ruled out for MI. She was referred to cardiology--for outpatient evaluation.    Recent Hospitalizations: None besides ER visit noted above  Studies Personally Reviewed - (if available, images/films reviewed: From Epic Chart or Care Everywhere)  None  Interval History: Heather Perry presents today stating that she really has not had any further episodes of her heartburn type chest discomfort since that emergency room visit.  She is now taking a PPI and H2 blocker.  Since doing that her symptoms have notably improved.  The concerning thing about the most recent ER visit to [facility] was that she had been to other emergency rooms at least 7 if not 9 times prior to that episode.  Again she noted that her chest discomfort was worse with lying down it was a burning sharp discomfort that lasted most of the evening, got better with taking antacids.  Occasionally got worse with walking around, but usually worse with lying down or depending on what she would eat.  Currently she has not had any recent chest pain with rest or exertion.  She does not do any exercise, so she may get a little bit  short of breath when she walks around, but nothing significant.  No chest pain or shortness of breath with rest or exertion. No PND, orthopnea or edema. No palpitations, lightheadedness, dizziness, weakness or syncope/near syncope. No TIA/amaurosis fugax symptoms. No claudication.  ROS: A comprehensive was performed. Review of Systems  Constitutional: Positive for malaise/fatigue. Negative for weight loss.  HENT: Negative for congestion and nosebleeds.   Respiratory: Positive for wheezing (Rarely). Negative for cough and shortness of breath (No recent flareup of Sjogren's).   Gastrointestinal: Negative for abdominal pain, blood in stool, constipation, heartburn (Symptoms now resolved/adequately treated.) and melena.  Musculoskeletal: Positive for joint pain. Negative for back pain, falls and neck pain.  Skin: Negative.   Neurological: Positive for dizziness (Occasionally positional).  Endo/Heme/Allergies: Positive for environmental allergies.  Psychiatric/Behavioral: Positive for depression. The patient is nervous/anxious.   All other systems reviewed and are negative.  I have reviewed and (if needed) personally updated the patient's problem list, medications, allergies, past medical and surgical history, social and family history.   Past Medical History:  Diagnosis Date  . Allergy   . Anxiety   . Arthritis   . Asthma   . Blood transfusion without reported diagnosis   . Depression   . GERD (gastroesophageal reflux disease)   . High cholesterol   . Hypertension   . Obesity   . Osteoporosis   . Recurrent boils   . Seizures ([place])    last seizure "3 years  ago" per pt 06-16-16 KBW  . Sleep apnea    no CPAP used  . Uterine fibroid     Past Surgical History:  Procedure Laterality Date  . ABDOMINAL HYSTERECTOMY    . CESAREAN SECTION    . KNEE SURGERY Right    x2  . UPPER GASTROINTESTINAL ENDOSCOPY      Current Meds  Medication Sig  . acyclovir ointment (ZOVIRAX) 5 % Apply  1 application topically every 3 (three) hours.  Marland Kitchen albuterol (PROVENTIL HFA;VENTOLIN HFA) 108 (90 Base) MCG/ACT inhaler Inhale 2 puffs into the lungs every 6 (six) hours as needed for wheezing or shortness of breath.  . ALPRAZolam (XANAX) 0.5 MG tablet TAKE ONE TABLET (0.5 MG TOTAL) BY MOUTH DAILY AS NEEDED FOR ANXIETY.  Marland Kitchen AMITIZA 24 MCG capsule TAKE 1 CAPSULE BY MOUTH TWICE A DAY  WITH FOOD.  Marland Kitchen aspirin 325 MG EC tablet TAKE 1 TABLET (325 MG TOTAL) BY MOUTH DAILY.  . budesonide-formoterol (SYMBICORT) 160-4.5 MCG/ACT inhaler Inhale 2 puffs into the lungs 2 (two) times daily. (Patient taking differently: Inhale 2 puffs into the lungs 2 (two) times daily as needed (wheezing, sob). )  . celecoxib (CELEBREX) 200 MG capsule Take 1 capsule (200 mg total) by mouth 2 (two) times daily.  . cetirizine (ZYRTEC) 10 MG tablet Take 1 tablet by mouth daily.  . chlorhexidine (HIBICLENS) 4 % external liquid Apply topically daily as needed.  . ergocalciferol (VITAMIN D2) 50000 units capsule Take 1 capsule (50,000 Units total) by mouth once a week. (Patient taking differently: Take 50,000 Units by mouth 2 (two) times a week. )  . esomeprazole (NEXIUM) 40 MG capsule Take 1 capsule (40 mg total) by mouth daily at 12 noon.  . famotidine (PEPCID) 20 MG tablet Take 1 tablet (20 mg total) by mouth 2 (two) times daily.  Marland Kitchen gabapentin (NEURONTIN) 300 MG capsule Take 2 capsules (600 mg total) by mouth 3 (three) times daily.  . hydrochlorothiazide (HYDRODIURIL) 25 MG tablet Take 1 tablet (25 mg total) by mouth daily.  . hydroxychloroquine (PLAQUENIL) 200 MG tablet Take 1 tablet by mouth daily.  Marland Kitchen ipratropium-albuterol (DUONEB) 0.5-2.5 (3) MG/3ML SOLN Take 3 mLs by nebulization every 6 (six) hours as needed.  . Misc. Devices (STEP N REST II WALKER) MISC 1 each by Does not apply route daily.  . mupirocin cream (BACTROBAN) 2 % Apply 1 application topically 2 (two) times daily. To breast  . oxyCODONE (ROXICODONE) 15 MG immediate  release tablet Take 15 mg by mouth every 4 (four) hours as needed for pain.  . phenytoin (DILANTIN) 100 MG ER capsule Take 1 capsule (100 mg total) by mouth 3 (three) times daily.  . pravastatin (PRAVACHOL) 20 MG tablet Take 1 tablet (20 mg total) by mouth daily.  Marland Kitchen triamcinolone cream (KENALOG) 0.1 % Apply topically 2 (two) times daily.  . VOLTAREN 1 % GEL APPLY THREE TIMES PER DAY AS NEEDED TO AFFECTED AREA  . [DISCONTINUED] citalopram (CELEXA) 40 MG tablet Take 1 tablet (40 mg total) by mouth daily.  . [DISCONTINUED] diazepam (VALIUM) 5 MG tablet Take 5 mg by mouth every 6 (six) hours as needed for anxiety.  . [DISCONTINUED] potassium chloride SA (K-DUR,KLOR-CON) 20 MEQ tablet Take 1 tablet (20 mEq total) by mouth 2 (two) times daily.  . [DISCONTINUED] sulfamethoxazole-trimethoprim (BACTRIM DS) 800-160 MG tablet Take 1 tablet by mouth 2 (two) times daily.  . [DISCONTINUED] thiamine (VITAMIN B-1) 100 MG tablet Take 1 tablet (100 mg total) by mouth  daily.   Current Facility-Administered Medications for the 06/26/17 encounter (Office Visit) with Leonie Man, MD  Medication  . 0.9 %  sodium chloride infusion    Allergies  Allergen Reactions  . Lortab [Hydrocodone-Acetaminophen]     Started itching  . Penicillins Itching    Social History   Tobacco Use  . Smoking status: Never Smoker  . Smokeless tobacco: Never Used  Substance Use Topics  . Alcohol use: No  . Drug use: Yes    Types: "Crack" cocaine    Comment: last smoked 2011   Social History   Social History Narrative  . Not on file    family history includes Breast cancer in her maternal aunt and maternal aunt.  No premature CAD or DM.  Mother - Asthma  Wt Readings from Last 3 Encounters:  06/26/17 223 lb 9.6 oz (101.4 kg)  04/13/17 217 lb (98.4 kg)  03/08/17 210 lb (95.3 kg)    PHYSICAL EXAM BP 122/80 (BP Location: Right Arm, Patient Position: Sitting, Cuff Size: Large)   Pulse 76   Ht 5\' 2"  (1.575 m)   Wt 223  lb 9.6 oz (101.4 kg)   BMI 40.90 kg/m  Physical Exam  Constitutional: She is oriented to person, place, and time. She appears well-developed and well-nourished. No distress.  Morbidly obese  HENT:  Head: Normocephalic and atraumatic.  Mouth/Throat: Oropharynx is clear and moist. No oropharyngeal exudate.  Eyes: Conjunctivae and EOM are normal. Pupils are equal, round, and reactive to light. No scleral icterus.  Neck: Normal range of motion. Neck supple. No hepatojugular reflux and no JVD present. Carotid bruit is not present.  Cardiovascular: Normal rate, regular rhythm and intact distal pulses.  No extrasystoles are present. PMI is not displaced (Unable to palpate). Exam reveals distant heart sounds. Exam reveals no gallop and no friction rub.  No murmur heard. Pulmonary/Chest: Effort normal and breath sounds normal. No respiratory distress. She has no wheezes. She has no rales.  Abdominal: Soft. Bowel sounds are normal. She exhibits no distension. There is no tenderness.  Obese  Musculoskeletal: Normal range of motion. She exhibits edema (Trivial).  Neurological: She is alert and oriented to person, place, and time. No cranial nerve deficit.  Skin: Skin is warm and dry. No erythema.  Psychiatric: She has a normal mood and affect. Her behavior is normal. Judgment and thought content normal.  Nursing note and vitals reviewed.   Adult ECG Report  Rate: 76;  Rhythm: normal sinus rhythm and He is he was time normal axis, intervals and durations.  Cannot exclude septal MI, age undetermined.;   Narrative Interpretation: Essentially normal EKG   Other studies Reviewed: Additional studies/ records that were reviewed today include:  Recent Labs:   Lab Results  Component Value Date   CHOL 173 01/11/2017   HDL 74 01/11/2017   LDLCALC 79 01/11/2017   TRIG 100 01/11/2017   CHOLHDL 2.3 01/11/2017    ASSESSMENT / PLAN: Problem List Items Addressed This Visit    Chest pain with low risk for  cardiac etiology - Primary (Chronic)    She is referred for cardiology evaluation as a reflex from the emergency room.  Her presentation was clearly GI in nature, and she has not had any further symptoms.  She is however concerned because she was told it could be cardiac.  We will therefore evaluate her with a GXT.  I initially thought about doing coronary calcium score, but she was concerned about the financial  ramifications.  Follow-up will be based on the results of her study.  If normal, would  -Simply have her follow-up as needed.  Otherwise if abnormal we will see her back sooner.      Relevant Orders   EKG 12-Lead (Completed)   CT CARDIAC SCORING   EXERCISE TOLERANCE TEST (ETT)   Essential hypertension (Chronic)    Controlled on low-dose meds.      Relevant Orders   EXERCISE TOLERANCE TEST (ETT)   Hyperlipidemia with target LDL less than 100 (Chronic)    Relatively well controlled on pravastatin         Current medicines are reviewed at length with the patient today. (+/- concerns) none The following changes have been made:None  Patient Instructions  NO MEDICATION CHANGES  If test is abnormal, another test will need to be completed and a follow up appointment will be schedule. If normal follow on an as needed basis.   SCHEDULE AT [facility] Your physician has requested that you have an exercise tolerance test. For further information please visit [email or web address]. Please also follow instruction sheet, as given.   Studies Ordered:   Orders Placed This Encounter  Procedures  . CT CARDIAC SCORING  . EXERCISE TOLERANCE TEST (ETT)  . EKG 12-Lead      Glenetta Hew, M.D., M.S. Interventional Cardiologist   Pager # [phone] Phone # [phone] [address] [place], [place] [address]   Thank you for choosing [facility] at [facility]!!

## 2017-06-28 ENCOUNTER — Ambulatory Visit (INDEPENDENT_AMBULATORY_CARE_PROVIDER_SITE_OTHER): Payer: Medicaid Other | Admitting: Psychiatry

## 2017-06-28 ENCOUNTER — Encounter (HOSPITAL_COMMUNITY): Payer: Self-pay | Admitting: Psychiatry

## 2017-06-28 ENCOUNTER — Encounter: Payer: Self-pay | Admitting: Cardiology

## 2017-06-28 VITALS — BP 133/74 | HR 77 | Ht 62.0 in | Wt 220.0 lb

## 2017-06-28 DIAGNOSIS — G4700 Insomnia, unspecified: Secondary | ICD-10-CM

## 2017-06-28 DIAGNOSIS — F419 Anxiety disorder, unspecified: Secondary | ICD-10-CM

## 2017-06-28 DIAGNOSIS — F1410 Cocaine abuse, uncomplicated: Secondary | ICD-10-CM | POA: Diagnosis not present

## 2017-06-28 DIAGNOSIS — M3500 Sicca syndrome, unspecified: Secondary | ICD-10-CM | POA: Diagnosis not present

## 2017-06-28 DIAGNOSIS — F330 Major depressive disorder, recurrent, mild: Secondary | ICD-10-CM | POA: Insufficient documentation

## 2017-06-28 DIAGNOSIS — M549 Dorsalgia, unspecified: Secondary | ICD-10-CM | POA: Diagnosis not present

## 2017-06-28 DIAGNOSIS — R450 Nervousness: Secondary | ICD-10-CM | POA: Diagnosis not present

## 2017-06-28 MED ORDER — DULOXETINE HCL 20 MG PO CPEP: ORAL_CAPSULE | ORAL | 0 refills | Status: DC

## 2017-06-28 MED ORDER — TRAZODONE HCL 50 MG PO TABS: ORAL_TABLET | ORAL | 0 refills | Status: DC

## 2017-06-28 NOTE — Assessment & Plan Note (Addendum)
She is referred for cardiology evaluation as a reflex from the emergency room.  Her presentation was clearly GI in nature, and she has not had any further symptoms.  She is however concerned because she was told it could be cardiac.  We will therefore evaluate her with a GXT.  I initially thought about doing coronary calcium score, but she was concerned about the financial ramifications.  Follow-up will be based on the results of her study.  If normal, would  -Simply have her follow-up as needed.  Otherwise if abnormal we will see her back sooner.

## 2017-06-28 NOTE — Patient Instructions (Signed)
1. Start duloxetine 20 mg daily for one week, then 40 mg daily  2. Decrease celexa 20 mg daily for one week, then discontinue  3. Start Trazodone 25 mg-50 mg at night as needed for sleep 4. Return to clinic in one month for 30 mins

## 2017-06-28 NOTE — Assessment & Plan Note (Signed)
Controlled on low-dose meds.

## 2017-06-28 NOTE — Assessment & Plan Note (Signed)
Relatively well controlled on pravastatin

## 2017-07-03 ENCOUNTER — Telehealth (HOSPITAL_COMMUNITY): Payer: Self-pay | Admitting: *Deleted

## 2017-07-03 NOTE — Telephone Encounter (Signed)
Spoke with patient & advise per Dr Modesta Messing: Advise her to discontinue trazodone first. If her symptoms does not improve, then advise her to discontinue duloxetine. I can order other antidepressant if duloxetine caused side effect. She can do it whether after her symptoms resolves or at her next visit.

## 2017-07-03 NOTE — Telephone Encounter (Signed)
Advise her to discontinue trazodone first. If her symptoms does not improve, then advise her to discontinue duloxetine. I can order other antidepressant if duloxetine caused side effect. She can do it whether after her symptoms resolves or at her next visit.

## 2017-07-03 NOTE — Telephone Encounter (Signed)
Dr Modesta Messing  Patient called stating that since benign on Cymbalta & Desyrel she has been having the shakes   & very noticeable fine  bumps allover her face. She called her PCP who referred her back to you  to notify of symptoms.

## 2017-07-11 ENCOUNTER — Telehealth (HOSPITAL_COMMUNITY): Payer: Self-pay | Admitting: *Deleted

## 2017-07-11 ENCOUNTER — Other Ambulatory Visit (HOSPITAL_COMMUNITY): Payer: Self-pay | Admitting: Psychiatry

## 2017-07-11 MED ORDER — SERTRALINE HCL 50 MG PO TABS: ORAL_TABLET | ORAL | 0 refills | Status: DC

## 2017-07-11 NOTE — Telephone Encounter (Signed)
Discussed with the patient.   She feels more anxious after starting duloxetine. Will switch from duloxetine to sertraline. Advised the patient to contact the clinic if she experiences worsening side effect. Discussed potential side effect of drowsiness and GI symptoms. Noted that it is less likely she has withdrawal from Xanax; she used to take 0.5 mg daily.  - Discontinue duloxetine - Start sertraline 25 mg daily for one week, then 50 mg daily

## 2017-07-11 NOTE — Telephone Encounter (Signed)
Dr Modesta Messing, Patient has called in today with complaints of Cymbalta. She states that the medication is   given her the shakes, crying, depressed, thinking about things ( bad memories). She is   requesting to PLEASE be back on Xanax. # [phone]

## 2017-07-14 ENCOUNTER — Telehealth (HOSPITAL_COMMUNITY): Payer: Self-pay | Admitting: *Deleted

## 2017-07-14 NOTE — Telephone Encounter (Signed)
Patient call backed " saying the doctor won't put me on my medicine I'm going to have to go"  transfer to another doctor. When explained how that would be helpful to facilitate thru the "PCP" . She stated " Well just keep my appointment for next month & hung up.

## 2017-07-14 NOTE — Telephone Encounter (Signed)
Spoke with patient & informed per Dr Modesta Messing: inform the patient that I will not prescribe benzodiazepine. I would be happy to discuss at her next visit for face to face evaluation.    Informed patient aware of appointment on 2/11 but if she like we could check with front office to see if anything  Earlier is available. She stated " Ahh I call back"

## 2017-07-14 NOTE — Telephone Encounter (Signed)
Dr Modesta Messing Patient called stating that she did not get that medication  You sent in. Stated that her daughter not statisfied with her trying all these different medications. Then she said " I know my body" I need to be on Xanax or Klonopin. And to please have the doctor call me [phone]

## 2017-07-14 NOTE — Telephone Encounter (Signed)
Please inform the patient that I will not prescribe benzodiazepine. I would be happy to discuss at her next visit for face to face evaluation.
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
## 2017-07-31 ENCOUNTER — Ambulatory Visit (INDEPENDENT_AMBULATORY_CARE_PROVIDER_SITE_OTHER): Payer: Medicaid Other | Admitting: Psychiatry

## 2017-07-31 ENCOUNTER — Encounter (HOSPITAL_COMMUNITY): Payer: Self-pay | Admitting: Psychiatry

## 2017-07-31 VITALS — BP 158/81 | HR 71 | Ht 62.0 in | Wt 217.0 lb

## 2017-07-31 DIAGNOSIS — F419 Anxiety disorder, unspecified: Secondary | ICD-10-CM | POA: Diagnosis not present

## 2017-07-31 DIAGNOSIS — F1490 Cocaine use, unspecified, uncomplicated: Secondary | ICD-10-CM

## 2017-07-31 DIAGNOSIS — F330 Major depressive disorder, recurrent, mild: Secondary | ICD-10-CM

## 2017-07-31 DIAGNOSIS — R450 Nervousness: Secondary | ICD-10-CM | POA: Diagnosis not present

## 2017-07-31 DIAGNOSIS — G4700 Insomnia, unspecified: Secondary | ICD-10-CM | POA: Diagnosis not present

## 2017-07-31 MED ORDER — HYDROXYZINE HCL 25 MG PO TABS
25.0000 mg | ORAL_TABLET | Freq: Every evening | ORAL | 0 refills | Status: DC | PRN
Start: 2017-07-31 — End: 2020-08-19

## 2017-07-31 MED ORDER — ESCITALOPRAM OXALATE 10 MG PO TABS: ORAL_TABLET | ORAL | 0 refills | Status: DC

## 2017-07-31 NOTE — Patient Instructions (Addendum)
1. Discontinue duloxetine, sertraline, Trazodone 2. Start lexapro 5 mg daily for one week, then 10 mg daily  3. Start hydroxyzine 25 mg at night as needed for sleep, anxiety  4. Referral to therapy 5. Return to clinic in one month for 30 mins

## 2017-08-01 ENCOUNTER — Emergency Department (HOSPITAL_COMMUNITY)
Admission: EM | Admit: 2017-08-01 | Discharge: 2017-08-01 | Disposition: A | Discharge status: Home / Self Care | Payer: Medicaid Other | Attending: Emergency Medicine | Admitting: Emergency Medicine

## 2017-08-01 ENCOUNTER — Emergency Department (HOSPITAL_COMMUNITY): Payer: Medicaid Other

## 2017-08-01 ENCOUNTER — Encounter (HOSPITAL_COMMUNITY): Payer: Self-pay

## 2017-08-01 DIAGNOSIS — M6281 Muscle weakness (generalized): Secondary | ICD-10-CM | POA: Diagnosis present

## 2017-08-01 DIAGNOSIS — R202 Paresthesia of skin: Secondary | ICD-10-CM

## 2017-08-01 DIAGNOSIS — Z7982 Long term (current) use of aspirin: Secondary | ICD-10-CM | POA: Diagnosis not present

## 2017-08-01 DIAGNOSIS — Z79899 Other long term (current) drug therapy: Secondary | ICD-10-CM | POA: Insufficient documentation

## 2017-08-01 DIAGNOSIS — J45909 Unspecified asthma, uncomplicated: Secondary | ICD-10-CM | POA: Diagnosis not present

## 2017-08-01 DIAGNOSIS — I10 Essential (primary) hypertension: Secondary | ICD-10-CM | POA: Insufficient documentation

## 2017-08-01 LAB — CBC
HEMATOCRIT: 39.9 % (ref 36.0–46.0)
HEMOGLOBIN: 13.5 g/dL (ref 12.0–15.0)
MCH: 30.9 pg (ref 26.0–34.0)
MCHC: 33.8 g/dL (ref 30.0–36.0)
MCV: 91.3 fL (ref 78.0–100.0)
Platelets: 175 10*3/uL (ref 150–400)
RBC: 4.37 MIL/uL (ref 3.87–5.11)
RDW: 13.2 % (ref 11.5–15.5)
WBC: 5.9 10*3/uL (ref 4.0–10.5)

## 2017-08-01 LAB — URINALYSIS, ROUTINE W REFLEX MICROSCOPIC
BILIRUBIN URINE: NEGATIVE
Glucose, UA: NEGATIVE mg/dL
Hgb urine dipstick: NEGATIVE
Ketones, ur: NEGATIVE mg/dL
LEUKOCYTES UA: NEGATIVE
NITRITE: NEGATIVE
PH: 5 (ref 5.0–8.0)
Protein, ur: NEGATIVE mg/dL
SPECIFIC GRAVITY, URINE: 1.013 (ref 1.005–1.030)

## 2017-08-01 LAB — BASIC METABOLIC PANEL
ANION GAP: 12 (ref 5–15)
BUN: 13 mg/dL (ref 6–20)
CALCIUM: 9.3 mg/dL (ref 8.9–10.3)
CO2: 23 mmol/L (ref 22–32)
Chloride: 104 mmol/L (ref 101–111)
Creatinine, Ser: 0.68 mg/dL (ref 0.44–1.00)
GLUCOSE: 113 mg/dL — AB (ref 65–99)
POTASSIUM: 3.9 mmol/L (ref 3.5–5.1)
Sodium: 139 mmol/L (ref 135–145)

## 2017-08-01 LAB — I-STAT BETA HCG BLOOD, ED (MC, WL, AP ONLY): I-stat hCG, quantitative: <5 m[IU]/mL (ref <5)

## 2017-08-01 LAB — CBG MONITORING, ED: Glucose-Capillary: 101 mg/dL — ABNORMAL HIGH (ref 65–99)

## 2017-08-01 MED ORDER — OXYCODONE HCL 5 MG PO TABS
15.0000 mg | ORAL_TABLET | Freq: Once | ORAL | Status: AC
  Administered 2017-08-01: 15 mg via ORAL
  Filled 2017-08-01: qty 3

## 2017-08-01 NOTE — ED Provider Notes (Signed)
[facility] EMERGENCY DEPARTMENT Provider Note   CSN: [id number] Arrival date & time: 08/01/17  9379     History   Chief Complaint Chief Complaint  Patient presents with  . Weakness  . Multiple complaints    HPI Heather Perry is a 52 y.o. female.  HPI   Heather Perry is a 52 y.o. female, with a history of chronic back pain, obesity, HTN, hypercholesterolemia, asthma, presenting to the ED with right arm "heaviness" that began around 9pm last night. Lasted for about 5 minutes. Accompanied by tingling all over, increase in her chronic back pain, and nausea.  States she saw pain management yesterday, but was unable to get her narcotic pain medication refill. Last dose was yesterday morning.  Denies pain, SOB, CP, falls/trauma, numbness, acute back pain, neck pain, or any other complaints.   Patient adds she has a chronic droop to the right side of her mouth.   Past Medical History:  Diagnosis Date  . Allergy   . Anxiety   . Arthritis   . Asthma   . Blood transfusion without reported diagnosis   . Depression   . GERD (gastroesophageal reflux disease)   . High cholesterol   . Hypertension   . Obesity   . Osteoporosis   . Recurrent boils   . Seizures ([place])    last seizure "3 years ago" per pt 06-16-16 KBW  . Sleep apnea    no CPAP used  . Uterine fibroid     Patient Active Problem List   Diagnosis Date Noted  . Anxiety disorder 06/28/2017  . MDD (major depressive disorder), recurrent episode, mild ([place]) 06/28/2017  . Essential hypertension 06/26/2017  . Hyperlipidemia with target LDL less than 100 06/26/2017  . Chest pain with low risk for cardiac etiology 06/26/2017  . 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 Rash and nonspecific skin eruption 04/13/2017    Past Surgical History:  Procedure Laterality Date  . ABDOMINAL HYSTERECTOMY    . CESAREAN SECTION    . KNEE SURGERY Right    x2  . UPPER  GASTROINTESTINAL ENDOSCOPY      OB History    No data available       Home Medications    Prior to Admission medications   Medication Sig Start Date End Date Taking? Authorizing Provider  acyclovir ointment (ZOVIRAX) 5 % Apply 1 application topically every 3 (three) hours. 05/18/16  Yes Micheline Chapman, NP  albuterol (PROVENTIL HFA;VENTOLIN HFA) 108 (90 Base) MCG/ACT inhaler Inhale 2 puffs into the lungs every 6 (six) hours as needed for wheezing or shortness of breath. 05/18/16  Yes Micheline Chapman, NP  ALPRAZolam (XANAX) 0.5 MG tablet TAKE ONE TABLET (0.5 MG TOTAL) BY MOUTH DAILY AS NEEDED FOR ANXIETY. 06/16/17  Yes [provider]  AMITIZA 24 MCG capsule TAKE 1 CAPSULE BY MOUTH TWICE A DAY  WITH FOOD. 05/26/17  Yes [provider]  aspirin 325 MG EC tablet TAKE 1 TABLET (325 MG TOTAL) BY MOUTH DAILY. 09/12/16  Yes Dorena Dew, FNP  budesonide-formoterol (SYMBICORT) 160-4.5 MCG/ACT inhaler Inhale 2 puffs into the lungs 2 (two) times daily. Patient taking differently: Inhale 2 puffs into the lungs 2 (two) times daily as needed (wheezing, sob).  05/18/16  Yes Micheline Chapman, NP  celecoxib (CELEBREX) 200 MG capsule Take 1 capsule (200 mg total) by mouth 2 (two) times daily. 05/18/16  Yes Micheline Chapman, NP  cetirizine (ZYRTEC)  10 MG tablet Take 1 tablet by mouth daily. 05/02/17 05/02/18 Yes [provider]  chlorhexidine (HIBICLENS) 4 % external liquid Apply topically daily as needed. 12/12/16  Yes Clent Demark, PA-C  ergocalciferol (VITAMIN D2) 50000 units capsule Take 1 capsule (50,000 Units total) by mouth once a week. Patient taking differently: Take 50,000 Units by mouth 2 (two) times a week.  05/18/16  Yes Micheline Chapman, NP  escitalopram (LEXAPRO) 10 MG tablet Start 5 mg daily for one week, then 10 mg daily 07/31/17  Yes Hisada, Elie Goody, MD  esomeprazole (NEXIUM) 40 MG capsule Take 1 capsule (40 mg total) by mouth daily at 12 noon.  05/18/16  Yes Micheline Chapman, NP  famotidine (PEPCID) 20 MG tablet Take 1 tablet (20 mg total) by mouth 2 (two) times daily. 04/20/17  Yes Khatri, Hina, PA-C  gabapentin (NEURONTIN) 300 MG capsule Take 2 capsules (600 mg total) by mouth 3 (three) times daily. 06/01/17  Yes Clent Demark, PA-C  hydrochlorothiazide (HYDRODIURIL) 25 MG tablet Take 1 tablet (25 mg total) by mouth daily. 05/18/16  Yes Micheline Chapman, NP  hydroxychloroquine (PLAQUENIL) 200 MG tablet Take 1 tablet by mouth daily. 03/21/17  Yes [provider]  hydrOXYzine (ATARAX/VISTARIL) 25 MG tablet Take 1 tablet (25 mg total) by mouth at bedtime as needed for anxiety (insomnia). 07/31/17  Yes Hisada, Elie Goody, MD  ipratropium-albuterol (DUONEB) 0.5-2.5 (3) MG/3ML SOLN Take 3 mLs by nebulization every 6 (six) hours as needed. 05/18/16  Yes Micheline Chapman, NP  Misc. Devices (STEP N REST II WALKER) MISC 1 each by Does not apply route daily. 03/08/17  Yes Clent Demark, PA-C  mupirocin cream (BACTROBAN) 2 % Apply 1 application topically 2 (two) times daily. To breast 04/13/17  Yes Riccio, Angela C, DO  oxyCODONE (ROXICODONE) 15 MG immediate release tablet Take 15 mg by mouth every 4 (four) hours as needed for pain.   Yes [provider]  phenytoin (DILANTIN) 100 MG ER capsule Take 1 capsule (100 mg total) by mouth 3 (three) times daily. 05/18/16  Yes Micheline Chapman, NP  pravastatin (PRAVACHOL) 20 MG tablet Take 1 tablet (20 mg total) by mouth daily. 05/18/16  Yes Micheline Chapman, NP  triamcinolone cream (KENALOG) 0.1 % Apply topically 2 (two) times daily. 05/23/17  Yes Clent Demark, PA-C  VOLTAREN 1 % GEL APPLY THREE TIMES PER DAY AS NEEDED TO AFFECTED AREA 06/16/17  Yes [provider]    Family History Family History  Problem Relation Age of Onset  . Breast cancer Maternal Aunt        unsure how old of onset  . Breast cancer Maternal Aunt        unsure how old of onset  . Colon  cancer Neg Hx   . Esophageal cancer Neg Hx   . Rectal cancer Neg Hx   . Stomach cancer Neg Hx     Social History Social History   Tobacco Use  . Smoking status: Never Smoker  . Smokeless tobacco: Never Used  Substance Use Topics  . Alcohol use: No  . Drug use: Yes    Types: "Crack" cocaine    Comment: last smoked 2011     Allergies   Lortab [hydrocodone-acetaminophen] and Penicillins   Review of Systems Review of Systems  Constitutional: Negative for chills, diaphoresis and fever.  HENT: Negative for trouble swallowing.   Respiratory: Negative for cough and shortness of breath.   Cardiovascular: Negative  for chest pain and leg swelling.  Gastrointestinal: Negative for abdominal pain, diarrhea, nausea and vomiting.  Musculoskeletal: Negative for back pain (none acute) and neck pain.  Neurological: Negative for dizziness, syncope, light-headedness, numbness and headaches.       Right arm "heaviness."  All other systems reviewed and are negative.    Physical Exam Updated Vital Signs BP 114/73 (BP Location: Left Arm)   Pulse 82   Temp 98.3 F (36.8 C) (Oral)   Resp 18   Ht 5\' 2"  (1.575 m)   Wt 98.4 kg (217 lb)   SpO2 96%   BMI 39.69 kg/m   Physical Exam  Constitutional: She is oriented to person, place, and time. She appears well-developed and well-nourished. No distress.  HENT:  Head: Normocephalic and atraumatic.  Mouth/Throat: Oropharynx is clear and moist.  Eyes: Conjunctivae and EOM are normal. Pupils are equal, round, and reactive to light.  Neck: Neck supple.  Cardiovascular: Normal rate, regular rhythm, normal heart sounds and intact distal pulses.  Pulmonary/Chest: Effort normal and breath sounds normal. No respiratory distress.  Abdominal: Soft. There is no tenderness. There is no guarding.  Musculoskeletal: She exhibits no edema or tenderness.  Full ROM in the right shoulder, elbow, and wrist. Normal motor function intact in all other extremities  and spine. No midline spinal tenderness.   Lymphadenopathy:    She has no cervical adenopathy.  Neurological: She is alert and oriented to person, place, and time.  No sensory deficits.  No noted speech deficits. No aphasia. Patient handles oral secretions without difficulty. No noted swallowing defects.  Equal grip strength bilaterally. Strength 5/5 in the upper extremities. Strength 5/5 with flexion and extension of the hips, knees, and ankles bilaterally.  Negative Romberg. No gait disturbance.  Coordination intact including heel to shin and finger to nose.  Cranial nerves III-XII grossly intact.  Patient has some facial droop on the right, which she states is chronic for her.    Skin: Skin is warm and dry. Capillary refill takes less than 2 seconds. She is not diaphoretic.  Psychiatric: She has a normal mood and affect. Her behavior is normal.  Nursing note and vitals reviewed.    ED Treatments / Results  Labs (all labs ordered are listed, but only abnormal results are displayed) Labs Reviewed  BASIC METABOLIC PANEL - Abnormal; Notable for the following components:      Result Value   Glucose, Bld 113 (*)    All other components within normal limits  CBG MONITORING, ED - Abnormal; Notable for the following components:   Glucose-Capillary 101 (*)    All other components within normal limits  CBC  URINALYSIS, ROUTINE W REFLEX MICROSCOPIC  I-STAT BETA HCG BLOOD, ED ([facility], [facility], [facility] ONLY)    EKG  EKG Interpretation  Date/Time:  Tuesday August 01 2017 08:19:24 EST Ventricular Rate:  87 PR Interval:    QRS Duration: 70 QT Interval:  362 QTC Calculation: 436 R Axis:   52 Text Interpretation:  Sinus rhythm No significant change since last tracing Confirmed by Addison Lank [phone]) on 08/01/2017 8:31:05 AM       Radiology Ct Head Wo Contrast  Result Date: 08/01/2017 CLINICAL DATA:  Right arm weakness. EXAM: CT HEAD WITHOUT CONTRAST TECHNIQUE: Contiguous axial images were  obtained from the base of the skull through the vertex without intravenous contrast. COMPARISON:  None. FINDINGS: Brain: There is no evidence of acute infarct, intracranial hemorrhage, mass, midline shift, or extra-axial fluid collection. The  ventricles and sulci are normal. Vascular: No hyperdense vessel. Skull: No fracture or focal osseous lesion. Sinuses/Orbits: Extensive, near complete opacification of the frontal and ethmoid sinuses bilaterally. Milder mucosal thickening in the maxillary and sphenoid sinuses. Small right maxillary and right sphenoid sinus fluid levels. Clear mastoid air cells. Unremarkable orbits. Other: None. IMPRESSION: 1. No evidence of acute intracranial abnormality. Unremarkable CT appearance of the brain. 2. Extensive sinusitis. Electronically Signed   By: Logan Bores M.D.   On: 08/01/2017 09:41    Procedures Procedures (including critical care time)  Medications Ordered in ED Medications  oxyCODONE (Oxy IR/ROXICODONE) immediate release tablet 15 mg (15 mg Oral Given 08/01/17 1109)     Initial Impression / Assessment and Plan / ED Course  I have reviewed the triage vital signs and the nursing notes.  Pertinent labs & imaging results that were available during my care of the patient were reviewed by me and considered in my medical decision making (see chart for details).     Patient's constellation of symptoms may be due to some minor withdrawal from narcotic pain medication.  She had no symptoms at ED presentation and remained symptom-free throughout ED course.  Lab results encouraging.  No acute abnormality on CT. Low suspicion for TIA or CVA.  PCP follow-up.  Discussed return precautions.  Patient voices understanding of these instructions, accepts the plan, and is comfortable with discharge.  Narcotic database search reveals patient's last oxycodone prescription was for 30-day supply of her 15 mg oxycodone filled on July 03, 2017.  Findings and plan of care  discussed with Addison Lank, MD. Dr. Leonette Monarch personally evaluated and examined this patient.  Final Clinical Impressions(s) / ED Diagnoses   Final diagnoses:  Tingling of right upper extremity    ED Discharge Orders    None       Layla Maw 08/02/17 1556    Cardama, Grayce Sessions, MD 08/05/17 [phone]

## 2017-08-01 NOTE — ED Triage Notes (Signed)
PER EMS: pt reports right arm weakness "it just doesn't feel right." She states she felt it tingling last night but went to bed and woke up this morning with it feeling "heavy." Pt A&OX4, no drift, speech clear, no neuro deficits noted at this time. Pt also reports new onset of generalized abdominal pain, denies n/v/d. She also reports chronic back pain and goes to a pain clinic for this but states the pain feels worse. BP-124/82, HR-90, CBG-98.

## 2017-08-01 NOTE — Discharge Instructions (Signed)
Please continue to take your home medications, as prescribed.  Follow-up with your primary care provider as soon as possible on this matter.  Return to the ED for any worsening symptoms.

## 2017-08-01 NOTE — ED Notes (Signed)
Pt taken to CT.

## 2017-08-01 NOTE — ED Notes (Signed)
Dr. Cardama at bedside.  

## 2017-08-01 NOTE — ED Notes (Signed)
Pt has a slight droop of her mouth but states this is her baseline due to the use of false teeth.

## 2017-08-19 ENCOUNTER — Encounter (HOSPITAL_COMMUNITY): Payer: Self-pay | Admitting: Emergency Medicine

## 2017-08-19 ENCOUNTER — Emergency Department (HOSPITAL_COMMUNITY)
Admission: EM | Admit: 2017-08-19 | Discharge: 2017-08-19 | Disposition: A | Discharge status: Home / Self Care | Payer: Medicaid Other | Attending: Emergency Medicine | Admitting: Emergency Medicine

## 2017-08-19 ENCOUNTER — Other Ambulatory Visit: Payer: Self-pay

## 2017-08-19 DIAGNOSIS — E876 Hypokalemia: Secondary | ICD-10-CM | POA: Diagnosis not present

## 2017-08-19 DIAGNOSIS — R51 Headache: Secondary | ICD-10-CM | POA: Insufficient documentation

## 2017-08-19 DIAGNOSIS — R1013 Epigastric pain: Secondary | ICD-10-CM | POA: Diagnosis present

## 2017-08-19 DIAGNOSIS — Z7982 Long term (current) use of aspirin: Secondary | ICD-10-CM | POA: Diagnosis not present

## 2017-08-19 DIAGNOSIS — R519 Headache, unspecified: Secondary | ICD-10-CM

## 2017-08-19 DIAGNOSIS — J45909 Unspecified asthma, uncomplicated: Secondary | ICD-10-CM | POA: Diagnosis not present

## 2017-08-19 DIAGNOSIS — I10 Essential (primary) hypertension: Secondary | ICD-10-CM | POA: Diagnosis not present

## 2017-08-19 DIAGNOSIS — R112 Nausea with vomiting, unspecified: Secondary | ICD-10-CM

## 2017-08-19 DIAGNOSIS — Z79899 Other long term (current) drug therapy: Secondary | ICD-10-CM | POA: Insufficient documentation

## 2017-08-19 DIAGNOSIS — K219 Gastro-esophageal reflux disease without esophagitis: Secondary | ICD-10-CM | POA: Diagnosis not present

## 2017-08-19 LAB — COMPREHENSIVE METABOLIC PANEL
ALT: 35 U/L (ref 14–54)
AST: 36 U/L (ref 15–41)
Albumin: 3.7 g/dL (ref 3.5–5.0)
Alkaline Phosphatase: 99 U/L (ref 38–126)
Anion gap: 13 (ref 5–15)
BUN: 14 mg/dL (ref 6–20)
CO2: 25 mmol/L (ref 22–32)
CREATININE: 0.74 mg/dL (ref 0.44–1.00)
Calcium: 9.1 mg/dL (ref 8.9–10.3)
Chloride: 97 mmol/L — ABNORMAL LOW (ref 101–111)
Glucose, Bld: 106 mg/dL — ABNORMAL HIGH (ref 65–99)
Potassium: 3.1 mmol/L — ABNORMAL LOW (ref 3.5–5.1)
Sodium: 135 mmol/L (ref 135–145)
TOTAL PROTEIN: 7.1 g/dL (ref 6.5–8.1)
Total Bilirubin: 0.6 mg/dL (ref 0.3–1.2)

## 2017-08-19 LAB — URINALYSIS, ROUTINE W REFLEX MICROSCOPIC
Bilirubin Urine: NEGATIVE
Glucose, UA: NEGATIVE mg/dL
Hgb urine dipstick: NEGATIVE
Ketones, ur: NEGATIVE mg/dL
LEUKOCYTES UA: NEGATIVE
NITRITE: NEGATIVE
PROTEIN: NEGATIVE mg/dL
Specific Gravity, Urine: 1.014 (ref 1.005–1.030)
pH: 5 (ref 5.0–8.0)

## 2017-08-19 LAB — I-STAT BETA HCG BLOOD, ED (MC, WL, AP ONLY)

## 2017-08-19 LAB — CBC
HCT: 40.2 % (ref 36.0–46.0)
Hemoglobin: 13.6 g/dL (ref 12.0–15.0)
MCH: 30.6 pg (ref 26.0–34.0)
MCHC: 33.8 g/dL (ref 30.0–36.0)
MCV: 90.5 fL (ref 78.0–100.0)
PLATELETS: 190 10*3/uL (ref 150–400)
RBC: 4.44 MIL/uL (ref 3.87–5.11)
RDW: 13.4 % (ref 11.5–15.5)
WBC: 5.3 10*3/uL (ref 4.0–10.5)

## 2017-08-19 LAB — I-STAT TROPONIN, ED: TROPONIN I, POC: 0 ng/mL (ref 0.00–0.08)

## 2017-08-19 LAB — LIPASE, BLOOD: Lipase: 27 U/L (ref 11–51)

## 2017-08-19 MED ORDER — GI COCKTAIL ~~LOC~~
30.0000 mL | Freq: Once | ORAL | Status: AC
  Administered 2017-08-19: 30 mL via ORAL
  Filled 2017-08-19: qty 30

## 2017-08-19 MED ORDER — SODIUM CHLORIDE 0.9 % IV BOLUS (SEPSIS)
1000.0000 mL | Freq: Once | INTRAVENOUS | Status: AC
  Administered 2017-08-19: 1000 mL via INTRAVENOUS

## 2017-08-19 MED ORDER — METOCLOPRAMIDE HCL 5 MG/ML IJ SOLN
10.0000 mg | Freq: Once | INTRAMUSCULAR | Status: AC
  Administered 2017-08-19: 10 mg via INTRAVENOUS
  Filled 2017-08-19: qty 2

## 2017-08-19 MED ORDER — DIPHENHYDRAMINE HCL 50 MG/ML IJ SOLN
25.0000 mg | Freq: Once | INTRAMUSCULAR | Status: AC
  Administered 2017-08-19: 25 mg via INTRAVENOUS
  Filled 2017-08-19: qty 1

## 2017-08-19 MED ORDER — POTASSIUM CHLORIDE CRYS ER 20 MEQ PO TBCR
40.0000 meq | EXTENDED_RELEASE_TABLET | Freq: Once | ORAL | Status: AC
  Administered 2017-08-19: 40 meq via ORAL
  Filled 2017-08-19: qty 2

## 2017-08-19 NOTE — ED Triage Notes (Signed)
Pt c/o feeling "sick " for 4 days-- started with headache, then vomiting x 3 days-- started with "acid reflex" -- no diarrhea

## 2017-08-19 NOTE — Discharge Instructions (Addendum)
Please follow up with your primary doctor within the next 5-7 days for re-evaluation and to follow up on further treatment of your acid reflux and low potassium.  These continue taking your prescription of Pepcid daily as you are currently prescribed.    Please return to the ER sooner if you have any new or worsening symptoms, or if you have any of the following symptoms:  Abdominal pain that does not go away.  You have a fever.  You keep throwing up (vomiting).  The pain is felt only in portions of the abdomen. Pain in the right side could possibly be appendicitis. In an adult, pain in the left lower portion of the abdomen could be colitis or diverticulitis.  You pass bloody or black tarry stools.  There is bright red blood in the stool.  The constipation stays for more than 4 days.  There is belly (abdominal) or rectal pain.  You do not seem to be getting better.  You have any questions or concerns.

## 2017-08-19 NOTE — ED Provider Notes (Signed)
[facility] Provider Note   CSN: [id number] Arrival date & time: 08/19/17  1025     History   Chief Complaint Chief Complaint  Patient presents with  . Nausea  . Emesis  . Headache    HPI Heather Perry is a 52 y.o. female.  HPI   Pt Is a 52 year old female with a history of seizures, hypertension, high cholesterol, obesity, GERD, who presents the ED today complaining of epigastric abdominal pain has been ongoing intermittently for the last 4 days. She does not currently have any abdominal pain. States that when she does have sxs it feels like a burning sensation that is consistent with her usual acid reflux symptoms. Intermittently radiates to her back.  States it is worse after she eats.  It is relieved when she takes tramadol and Pepcid.  States that she had not been taking her Pepcid for the last several days because she forgot, but states she took it for the last 2 days and has somewhat of improvement of her symptoms.  She also reports nausea and vomiting.  Denies any blood in the vomit.  States that vomiting occurs after she eats. She was able to tolerate solid foods this morning without vomiting.   She does also report generalized fatigue over the last several days.  States she recently had a cold and had been having nasal congestion and intermittent headaches.  Headaches are frontal, and occur intermittently.  They are gradual in onset.  Rates them 6/10.  Was seen at ENT doctor earlier this week and prescribed Flonase, steroids, and antibiotic, which she states she has been taking.  States her headaches have improved since she started taking this medication.  Denies any fevers, ear pain, sore throat, cough.  Denies any vision changes, lightheadedness, dizziness, unilateral weakness.  Denies any chest pain, shortness of breath, diarrhea, constipation, blood in stool, urinary symptoms.  Past Medical History:  Diagnosis Date  . Allergy   .  Anxiety   . Arthritis   . Asthma   . Blood transfusion without reported diagnosis   . Depression   . GERD (gastroesophageal reflux disease)   . High cholesterol   . Hypertension   . Obesity   . Osteoporosis   . Recurrent boils   . Seizures ([place])    last seizure "3 years ago" per pt 06-16-16 KBW  . Sleep apnea    no CPAP used  . Uterine fibroid     Patient Active Problem List   Diagnosis Date Noted  . Anxiety disorder 06/28/2017  . 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 Essential hypertension 06/26/2017  . Hyperlipidemia with target LDL less than 100 06/26/2017  . Chest pain with low risk for cardiac etiology 06/26/2017  . 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 Rash and nonspecific skin eruption 04/13/2017    Past Surgical History:  Procedure Laterality Date  . ABDOMINAL HYSTERECTOMY    . CESAREAN SECTION    . KNEE SURGERY Right    x2  . UPPER GASTROINTESTINAL ENDOSCOPY      OB History    No data available       Home Medications    Prior to Admission medications   Medication Sig Start Date End Date Taking? Authorizing Provider  acyclovir ointment (ZOVIRAX) 5 % Apply 1 application topically every 3 (three) hours. 05/18/16   Micheline Chapman, NP  albuterol (PROVENTIL HFA;VENTOLIN HFA) 108 (90  Base) MCG/ACT inhaler Inhale 2 puffs into the lungs every 6 (six) hours as needed for wheezing or shortness of breath. 05/18/16   Micheline Chapman, NP  ALPRAZolam Duanne Moron) 0.5 MG tablet TAKE ONE TABLET (0.5 MG TOTAL) BY MOUTH DAILY AS NEEDED FOR ANXIETY. 06/16/17   [provider]  AMITIZA 24 MCG capsule TAKE 1 CAPSULE BY MOUTH TWICE A DAY  WITH FOOD. 05/26/17   [provider]  aspirin 325 MG EC tablet TAKE 1 TABLET (325 MG TOTAL) BY MOUTH DAILY. 09/12/16   Dorena Dew, FNP  budesonide-formoterol (SYMBICORT) 160-4.5 MCG/ACT inhaler Inhale 2 puffs into the lungs 2  (two) times daily. Patient taking differently: Inhale 2 puffs into the lungs 2 (two) times daily as needed (wheezing, sob).  05/18/16   Micheline Chapman, NP  celecoxib (CELEBREX) 200 MG capsule Take 1 capsule (200 mg total) by mouth 2 (two) times daily. 05/18/16   Micheline Chapman, NP  cetirizine (ZYRTEC) 10 MG tablet Take 1 tablet by mouth daily. 05/02/17 05/02/18  [provider]  chlorhexidine (HIBICLENS) 4 % external liquid Apply topically daily as needed. 12/12/16   Clent Demark, PA-C  ergocalciferol (VITAMIN D2) 50000 units capsule Take 1 capsule (50,000 Units total) by mouth once a week. Patient taking differently: Take 50,000 Units by mouth 2 (two) times a week.  05/18/16   Micheline Chapman, NP  escitalopram (LEXAPRO) 10 MG tablet Start 5 mg daily for one week, then 10 mg daily 07/31/17   Norman Clay, MD  esomeprazole (NEXIUM) 40 MG capsule Take 1 capsule (40 mg total) by mouth daily at 12 noon. 05/18/16   Micheline Chapman, NP  famotidine (PEPCID) 20 MG tablet Take 1 tablet (20 mg total) by mouth 2 (two) times daily. 04/20/17   Khatri, Hina, PA-C  gabapentin (NEURONTIN) 300 MG capsule Take 2 capsules (600 mg total) by mouth 3 (three) times daily. 06/01/17   Clent Demark, PA-C  hydrochlorothiazide (HYDRODIURIL) 25 MG tablet Take 1 tablet (25 mg total) by mouth daily. 05/18/16   Micheline Chapman, NP  hydroxychloroquine (PLAQUENIL) 200 MG tablet Take 1 tablet by mouth daily. 03/21/17   [provider]  hydrOXYzine (ATARAX/VISTARIL) 25 MG tablet Take 1 tablet (25 mg total) by mouth at bedtime as needed for anxiety (insomnia). 07/31/17   Norman Clay, MD  ipratropium-albuterol (DUONEB) 0.5-2.5 (3) MG/3ML SOLN Take 3 mLs by nebulization every 6 (six) hours as needed. 05/18/16   Micheline Chapman, NP  Misc. Devices (STEP N REST II WALKER) MISC 1 each by Does not apply route daily. 03/08/17   Clent Demark, PA-C  mupirocin cream (BACTROBAN) 2 % Apply 1  application topically 2 (two) times daily. To breast 04/13/17   Lucila Maine C, DO  oxyCODONE (ROXICODONE) 15 MG immediate release tablet Take 15 mg by mouth every 4 (four) hours as needed for pain.    [provider]  phenytoin (DILANTIN) 100 MG ER capsule Take 1 capsule (100 mg total) by mouth 3 (three) times daily. 05/18/16   Micheline Chapman, NP  pravastatin (PRAVACHOL) 20 MG tablet Take 1 tablet (20 mg total) by mouth daily. 05/18/16   Micheline Chapman, NP  triamcinolone cream (KENALOG) 0.1 % Apply topically 2 (two) times daily. 05/23/17   Clent Demark, PA-C  VOLTAREN 1 % GEL APPLY THREE TIMES PER DAY AS NEEDED TO AFFECTED AREA 06/16/17   [provider]    Family History Family History  Problem Relation Age of Onset  . Breast cancer Maternal Aunt        unsure how old of onset  . Breast cancer Maternal Aunt        unsure how old of onset  . Colon cancer Neg Hx   . Esophageal cancer Neg Hx   . Rectal cancer Neg Hx   . Stomach cancer Neg Hx     Social History Social History   Tobacco Use  . Smoking status: Never Smoker  . Smokeless tobacco: Never Used  Substance Use Topics  . Alcohol use: No  . Drug use: No    Comment: last smoked 2011     Allergies   Lortab [hydrocodone-acetaminophen] and Penicillins   Review of Systems Review of Systems  Constitutional: Positive for fatigue. Negative for appetite change, chills and fever.  HENT: Positive for congestion and sinus pressure. Negative for ear pain, rhinorrhea, sore throat and trouble swallowing.   Eyes: Negative for pain and visual disturbance.  Respiratory: Negative for cough and shortness of breath.   Cardiovascular: Negative for chest pain, palpitations and leg swelling.  Gastrointestinal: Positive for abdominal pain, nausea and vomiting. Negative for blood in stool, constipation and diarrhea.  Genitourinary: Negative for dysuria, flank pain, frequency and urgency.  Musculoskeletal:  Negative for back pain and neck pain.  Neurological: Positive for headaches. Negative for dizziness, weakness, light-headedness and numbness.     Physical Exam Updated Vital Signs BP 105/73   Pulse 79   Temp 98 F (36.7 C) (Oral)   Resp 18   Ht 5\' 2"  (1.575 m)   Wt 99.8 kg (220 lb)   SpO2 97%   BMI 40.24 kg/m   Physical Exam  Constitutional: She is oriented to person, place, and time. She appears well-developed and well-nourished.  Non-toxic appearance. She does not appear ill. No distress.  Morbidly obese.  HENT:  Head: Normocephalic and atraumatic.  Mild pharyngeal erythema.  No tonsillar swelling or exudates.  Uvula midline.  Eyes: Conjunctivae and EOM are normal. Pupils are equal, round, and reactive to light. Right eye exhibits normal extraocular motion and no nystagmus. Left eye exhibits normal extraocular motion and no nystagmus. Right pupil is round and reactive. Left pupil is round.  Neck: Normal range of motion. Neck supple. No JVD present. No neck rigidity.  Cardiovascular: Normal rate, regular rhythm, normal heart sounds and intact distal pulses.  No murmur heard. Pulmonary/Chest: Effort normal and breath sounds normal. No respiratory distress. She has no wheezes.  Abdominal: Soft. Bowel sounds are normal.  Mild tenderness to the epigastric region with no guarding.  No right upper quadrant tenderness and negative Murphy sign.  Abdomen is soft.  No rigidity.  No peritoneal signs.  No rebound tenderness.  Musculoskeletal: She exhibits no edema.  Lymphadenopathy:    She has no cervical adenopathy.  Neurological: She is alert and oriented to person, place, and time.  Mental Status:  Alert, thought content appropriate, able to give a coherent history. Speech fluent without evidence of aphasia. Able to follow 2 step commands without difficulty.  Cranial Nerves:  II:  Peripheral visual fields grossly normal, pupils equal, round, reactive to light III,IV, VI: ptosis not  present, extra-ocular motions intact bilaterally  V,VII: smile symmetric, facial light touch sensation equal VIII: hearing grossly normal to voice  X: uvula elevates symmetrically  XI: bilateral shoulder shrug symmetric and strong XII: midline tongue extension without fassiculations Motor:  Normal tone. 5/5 strength of BUE and BLE major  muscle groups including strong and equal grip strength and dorsiflexion/plantar flexion Sensory: light touch normal in all extremities. Cerebellar: normal finger-to-nose with bilateral upper extremities CV: 2+ radial and DP/PT pulses  Skin: Skin is warm and dry. Capillary refill takes less than 2 seconds.  Psychiatric: She has a normal mood and affect.  Nursing note and vitals reviewed.    ED Treatments / Results  Labs (all labs ordered are listed, but only abnormal results are displayed) Labs Reviewed  COMPREHENSIVE METABOLIC PANEL - Abnormal; Notable for the following components:      Result Value   Potassium 3.1 (*)    Chloride 97 (*)    Glucose, Bld 106 (*)    All other components within normal limits  URINALYSIS, ROUTINE W REFLEX MICROSCOPIC - Abnormal; Notable for the following components:   APPearance HAZY (*)    All other components within normal limits  LIPASE, BLOOD  CBC  I-STAT BETA HCG BLOOD, ED ([facility], [facility], [facility] ONLY)  I-STAT TROPONIN, ED    EKG  EKG Interpretation  Date/Time:  Saturday August 19 2017 12:51:35 EST Ventricular Rate:  74 PR Interval:    QRS Duration: 80 QT Interval:  385 QTC Calculation: 428 R Axis:   32 Text Interpretation:  Sinus rhythm Low voltage, precordial leads Baseline wander in lead(s) V2 No significant change since last tracing Confirmed by Wandra Arthurs [phone]) on 08/19/2017 1:42:17 PM       Radiology No results found.  Procedures Procedures (including critical care time)  Medications Ordered in ED Medications  sodium chloride 0.9 % bolus 1,000 mL (0 mLs Intravenous Stopped 08/19/17 1400)    metoCLOPramide (REGLAN) injection 10 mg (10 mg Intravenous Given 08/19/17 1239)  diphenhydrAMINE (BENADRYL) injection 25 mg (25 mg Intravenous Given 08/19/17 1239)  gi cocktail (Maalox,Lidocaine,Donnatal) (30 mLs Oral Given 08/19/17 1234)  potassium chloride SA (K-DUR,KLOR-CON) CR tablet 40 mEq (40 mEq Oral Given 08/19/17 1256)     Initial Impression / Assessment and Plan / ED Course  I have reviewed the triage vital signs and the nursing notes.  Pertinent labs & imaging results that were available during my care of the patient were reviewed by me and considered in my medical decision making (see chart for details).  Discussed pt presentation, exam findings, and findings of workup with Dr. Darl Householder, who agrees with the plan to discharge the pt with outpatient f/u.   Final Clinical Impressions(s) / ED Diagnoses   Final diagnoses:  Nausea and vomiting, intractability of vomiting not specified, unspecified vomiting type  Gastroesophageal reflux disease, esophagitis presence not specified  Hypokalemia  Nonintractable headache, unspecified chronicity pattern, unspecified headache type   53 year old female presenting to the emergency department with epigastric abdominal pain, nausea, vomiting,. States that symptoms are consistent with chronic acid reflux, and admits to not taking her chronic GERD meds earlier this week which exacerbated sxs.  Vital signs stable, no acute distress.  Afebrile.  No acute distress.  Given epigastric abdominal pain, EKG and troponin were ordered to rule out ACS.  Troponin negative and ECG showed normal sinus rhythm with no ischemic changes.  Unchanged from previous.  Unlikely ACS.Marland Kitchen  Patient was given GI cocktail as well as fluids, and she is able to tolerate Po fluids in the ED.  States that her symptoms completely resolved after receiving medication. Repeat abdominal exam benign and there is no TTP or guarding. Not a surgical abd. Does not meet SIRS or sepsis criteria. No  indication of appendicitis, bowel obstruction,  bowel perforation, cholecystitis, diverticulitis, or ectopic pregnancy. She was also given Benadryl and Reglan for her headache and nausea.  States headache is completely resolved after meds. Normal neurologic exam and doubt ICH, SAH, stroke, or other intracranial abnormality. Mostly likely HA secondary to recent sinus congestion and will resolve after completion of abx, and tx with home flonase and PO steroids. Labs are reassuring with mild electrolyte changes that are likely secondary to 2-day history of vomiting.  Potassium replaced in the ED.  Lipase negative so less likely pancreatitis.  UA negative for infection. Advised her to continue her chronic acid reflux medications and follow-up with her PCP for further management of this as well as her hypokalemia.   Gave strict return precautions.  She expressed understanding agrees with plan.  States she would like to leave.  ED Discharge Orders    None       Rodney Booze, [place] 08/19/17 1446    Drenda Freeze, MD 08/20/17 [phone]
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
## 2017-08-28 ENCOUNTER — Telehealth (HOSPITAL_COMMUNITY): Payer: Self-pay | Admitting: *Deleted

## 2017-08-28 NOTE — Telephone Encounter (Signed)
Dr Modesta Messing [facility] called requesting refills on 2 of patients medication :  Hydroxyzine & Lexapro

## 2017-08-28 NOTE — Telephone Encounter (Signed)
I believe she has enough medication until tomorrow evaluation, when we might change the dose.

## 2017-08-29 ENCOUNTER — Encounter (HOSPITAL_COMMUNITY): Payer: Self-pay | Admitting: Psychiatry

## 2017-08-29 ENCOUNTER — Ambulatory Visit (INDEPENDENT_AMBULATORY_CARE_PROVIDER_SITE_OTHER): Payer: Medicaid Other | Admitting: Psychiatry

## 2017-08-29 VITALS — BP 132/76 | HR 97 | Ht 63.0 in | Wt 224.0 lb

## 2017-08-29 DIAGNOSIS — G4700 Insomnia, unspecified: Secondary | ICD-10-CM

## 2017-08-29 DIAGNOSIS — F419 Anxiety disorder, unspecified: Secondary | ICD-10-CM

## 2017-08-29 DIAGNOSIS — Z736 Limitation of activities due to disability: Secondary | ICD-10-CM

## 2017-08-29 DIAGNOSIS — F330 Major depressive disorder, recurrent, mild: Secondary | ICD-10-CM

## 2017-08-29 DIAGNOSIS — Z62810 Personal history of physical and sexual abuse in childhood: Secondary | ICD-10-CM | POA: Diagnosis not present

## 2017-08-29 DIAGNOSIS — G8929 Other chronic pain: Secondary | ICD-10-CM | POA: Diagnosis not present

## 2017-08-29 DIAGNOSIS — Z560 Unemployment, unspecified: Secondary | ICD-10-CM

## 2017-08-29 DIAGNOSIS — R450 Nervousness: Secondary | ICD-10-CM | POA: Diagnosis not present

## 2017-08-29 MED ORDER — CITALOPRAM HYDROBROMIDE 40 MG PO TABS: 40.0000 mg | ORAL_TABLET | Freq: Every day | ORAL | 0 refills | Status: DC

## 2017-08-29 NOTE — Patient Instructions (Signed)
1. Continue citalopram 40 mg daily  2. Discontinue hydroxyzine 3. Return to clinic in one month for 15 mins

## 2017-09-27 ENCOUNTER — Other Ambulatory Visit (HOSPITAL_COMMUNITY): Payer: Self-pay | Admitting: Psychiatry

## 2017-09-27 MED ORDER — CITALOPRAM HYDROBROMIDE 40 MG PO TABS: 40.0000 mg | ORAL_TABLET | Freq: Every day | ORAL | 0 refills | Status: DC

## 2017-10-16 ENCOUNTER — Other Ambulatory Visit (INDEPENDENT_AMBULATORY_CARE_PROVIDER_SITE_OTHER): Payer: Self-pay | Admitting: Physician Assistant

## 2017-10-16 DIAGNOSIS — M5136 Other intervertebral disc degeneration, lumbar region: Secondary | ICD-10-CM

## 2017-10-16 MED ORDER — GABAPENTIN 300 MG PO CAPS: 600.0000 mg | ORAL_CAPSULE | Freq: Three times a day (TID) | ORAL | 5 refills | Status: DC

## 2017-10-26 ENCOUNTER — Telehealth (HOSPITAL_COMMUNITY): Payer: Self-pay | Admitting: *Deleted

## 2017-10-26 NOTE — Telephone Encounter (Signed)
Dr Modesta Messing Patient called with complaint of Celexa not working well & that she had left over Sertraline & it   has been working better . She is requesting refill on Sertraline. Prefers if you would put her back on the   Klonopin states it helps the best for her

## 2017-10-26 NOTE — Telephone Encounter (Signed)
Adviser her to make sooner appointment and we can discuss about it (she has been self adjusting medication, which I do not think is safe). Will not plan to order clonazepam.
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
## 2017-11-06 ENCOUNTER — Ambulatory Visit (HOSPITAL_COMMUNITY): Payer: Self-pay | Admitting: Psychiatry

## 2017-11-25 NOTE — Progress Notes (Deleted)
BH MD/PA/NP OP Progress Note  11/25/2017 8:05 AM Ornella Coderre  MRN:  [id number]  Chief Complaint:  HPI: *** Visit Diagnosis: No diagnosis found.  Past Psychiatric History: Please see initial evaluation for full details. I have reviewed the history. No updates at this time.     Past Medical History:  Past Medical History:  Diagnosis Date  . Allergy   . Anxiety   . Arthritis   . Asthma   . Blood transfusion without reported diagnosis   . Depression   . GERD (gastroesophageal reflux disease)   . High cholesterol   . Hypertension   . Obesity   . Osteoporosis   . Recurrent boils   . Seizures ([place])    last seizure "3 years ago" per pt 06-16-16 KBW  . Sleep apnea    no CPAP used  . Uterine fibroid     Past Surgical History:  Procedure Laterality Date  . ABDOMINAL HYSTERECTOMY    . CESAREAN SECTION    . KNEE SURGERY Right    x2  . UPPER GASTROINTESTINAL ENDOSCOPY      Family Psychiatric History: Please see initial evaluation for full details. I have reviewed the history. No updates at this time.     Family History:  Family History  Problem Relation Age of Onset  . Breast cancer Maternal Aunt        unsure how old of onset  . Breast cancer Maternal Aunt        unsure how old of onset  . Colon cancer Neg Hx   . Esophageal cancer Neg Hx   . Rectal cancer Neg Hx   . Stomach cancer Neg Hx     Social History:  Social History   Socioeconomic History  . Marital status: Single    Spouse name: Not on file  . Number of children: Not on file  . Years of education: Not on file  . Highest education level: Not on file  Occupational History  . Not on file  Social Needs  . Financial resource strain: Not on file  . Food insecurity:    Worry: Not on file    Inability: Not on file  . Transportation needs:    Medical: Not on file    Non-medical: Not on file  Tobacco Use  . Smoking status: Never Smoker  . Smokeless tobacco: Never Used  Substance and Sexual Activity   . Alcohol use: No  . Drug use: No    Types: "Crack" cocaine    Comment: last smoked 2011  . Sexual activity: Not on file  Lifestyle  . Physical activity:    Days per week: Not on file    Minutes per session: Not on file  . Stress: Not on file  Relationships  . Social connections:    Talks on phone: Not on file    Gets together: Not on file    Attends religious service: Not on file    Active member of club or organization: Not on file    Attends meetings of clubs or organizations: Not on file    Relationship status: Not on file  Other Topics Concern  . Not on file  Social History Narrative  . Not on file    Allergies:  Allergies  Allergen Reactions  . Lortab [Hydrocodone-Acetaminophen]     Started itching  . Penicillins Itching    Metabolic Disorder Labs: Lab Results  Component Value Date   HGBA1C 5.3 04/25/2016   MPG 105  04/25/2016   No results found for: PROLACTIN Lab Results  Component Value Date   CHOL 173 01/11/2017   TRIG 100 01/11/2017   HDL 74 01/11/2017   CHOLHDL 2.3 01/11/2017   VLDL 20 04/25/2016   LDLCALC 79 01/11/2017   LDLCALC 85 04/25/2016   Lab Results  Component Value Date   TSH 3.25 04/25/2016    Therapeutic Level Labs: No results found for: LITHIUM No results found for: VALPROATE No components found for:  CBMZ  Current Medications: Current Outpatient Medications  Medication Sig Dispense Refill  . acyclovir ointment (ZOVIRAX) 5 % Apply 1 application topically every 3 (three) hours. 15 g 1  . albuterol (PROVENTIL HFA;VENTOLIN HFA) 108 (90 Base) MCG/ACT inhaler Inhale 2 puffs into the lungs every 6 (six) hours as needed for wheezing or shortness of breath. 1 Inhaler 1  . AMITIZA 24 MCG capsule TAKE 1 CAPSULE BY MOUTH TWICE A DAY  WITH FOOD.  5  . aspirin 325 MG EC tablet TAKE 1 TABLET (325 MG TOTAL) BY MOUTH DAILY. 90 tablet 1  . budesonide-formoterol (SYMBICORT) 160-4.5 MCG/ACT inhaler Inhale 2 puffs into the lungs 2 (two) times  daily. (Patient taking differently: Inhale 2 puffs into the lungs 2 (two) times daily as needed (wheezing, sob). ) 1 Inhaler 1  . celecoxib (CELEBREX) 200 MG capsule Take 1 capsule (200 mg total) by mouth 2 (two) times daily. 90 capsule 1  . cetirizine (ZYRTEC) 10 MG tablet Take 1 tablet by mouth daily.    . chlorhexidine (HIBICLENS) 4 % external liquid Apply topically daily as needed. 120 mL 0  . citalopram (CELEXA) 40 MG tablet Take 1 tablet (40 mg total) by mouth daily. 90 tablet 0  . ergocalciferol (VITAMIN D2) 50000 units capsule Take 1 capsule (50,000 Units total) by mouth once a week. (Patient taking differently: Take 50,000 Units by mouth 2 (two) times a week. ) 12 capsule 1  . esomeprazole (NEXIUM) 40 MG capsule Take 1 capsule (40 mg total) by mouth daily at 12 noon. 90 capsule 1  . famotidine (PEPCID) 20 MG tablet Take 1 tablet (20 mg total) by mouth 2 (two) times daily. 30 tablet 0  . gabapentin (NEURONTIN) 300 MG capsule Take 2 capsules (600 mg total) by mouth 3 (three) times daily. 180 capsule 5  . hydrochlorothiazide (HYDRODIURIL) 25 MG tablet Take 1 tablet (25 mg total) by mouth daily. 90 tablet 1  . hydroxychloroquine (PLAQUENIL) 200 MG tablet Take 1 tablet by mouth daily.    . hydrOXYzine (ATARAX/VISTARIL) 25 MG tablet Take 1 tablet (25 mg total) by mouth at bedtime as needed for anxiety (insomnia). 30 tablet 0  . ipratropium-albuterol (DUONEB) 0.5-2.5 (3) MG/3ML SOLN Take 3 mLs by nebulization every 6 (six) hours as needed. 360 mL 1  . Misc. Devices (STEP N REST II WALKER) MISC 1 each by Does not apply route daily. 1 each 0  . mupirocin cream (BACTROBAN) 2 % Apply 1 application topically 2 (two) times daily. To breast 15 g 0  . oxyCODONE (ROXICODONE) 15 MG immediate release tablet Take 15 mg by mouth every 4 (four) hours as needed for pain.    . phenytoin (DILANTIN) 100 MG ER capsule Take 1 capsule (100 mg total) by mouth 3 (three) times daily. 90 capsule 5  . pravastatin  (PRAVACHOL) 20 MG tablet Take 1 tablet (20 mg total) by mouth daily. 90 tablet 1  . triamcinolone cream (KENALOG) 0.1 % Apply topically 2 (two) times daily. 30 g  2  . VOLTAREN 1 % GEL APPLY THREE TIMES PER DAY AS NEEDED TO AFFECTED AREA  99   Current Facility-Administered Medications  Medication Dose Route Frequency Provider Last Rate Last Dose  . 0.9 %  sodium chloride infusion  500 mL Intravenous Continuous Nandigam, Venia Minks, MD         Musculoskeletal: Strength & Muscle Tone: within normal limits Gait & Station: normal Patient leans: N/A  Psychiatric Specialty Exam: ROS  There were no vitals taken for this visit.There is no height or weight on file to calculate BMI.  General Appearance: Fairly Groomed  Eye Contact:  Good  Speech:  Clear and Coherent  Volume:  Normal  Mood:  {BHH MOOD:22306}  Affect:  {Affect (PAA):22687}  Thought Process:  Coherent  Orientation:  Full (Time, Place, and Person)  Thought Content: Logical   Suicidal Thoughts:  {ST/HT (PAA):22692}  Homicidal Thoughts:  {ST/HT (PAA):22692}  Memory:  Immediate;   Good  Judgement:  {Judgement (PAA):22694}  Insight:  {Insight (PAA):22695}  Psychomotor Activity:  Normal  Concentration:  Concentration: Good and Attention Span: Good  Recall:  Good  Fund of Knowledge: Good  Language: Good  Akathisia:  No  Handed:  Right  AIMS (if indicated): not done  Assets:  Communication Skills Desire for Improvement  ADL's:  Intact  Cognition: WNL  Sleep:  {BHH GOOD/FAIR/POOR:22877}   Screenings: PHQ2-9     Office Visit from 03/08/2017 in [place] Office Visit from 01/11/2017 in [place] Office Visit from 12/12/2016 in [place]  PHQ-2 Total Score  0  0  2  PHQ-9 Total Score  -  -  9       Assessment and Plan:  Sedona Wenk is a 52 y.o. year old female with a history of depression, anxiety,asthma,Sjogren's syndrome without extraglandular  involvement, hypertension, GERD, seizure , who presents for follow up appointment for No diagnosis found.  # MDD, moderate, recurrent without psychotic features  There has been significant improvement in neurovegetative symptoms which coincided with moving into the residential place. She self discontinued lexapro with concern for headache and is back on citalopram, which she asked to switch at the initial encounter. Psychosocial stressors include her son in jail and trauma history. Repeatedly discussed with the patient that rationale of not restarting Xanax given potential sie effect of drowsiness especially with concomitant use of opioid and dependence (she also implied in the past that she received Xanax from someone else). Noted that she does have cluster B traits and her mood symptoms are most likely secondary to her ineffective coping skills. Although it is strongly recommended to see a therapist, she declines this option due to transportation. Will continue to discuss as indicated.   # Insomnia Patient reports nose congestion and endorses initial and middle insomnia. It is likely multifactorial given her physical condition, and she was recently started on steroid (which will be discontinued today per patient), which can also contribute to insomnia. She has tried medication as listed above for sleep with limited benefit. Discussed sleep hygiene.   Plan 1. Continue citalopram 40 mg daily  2. Discontinue hydroxyzine 3. Return to clinic in three months for 15 mins - She is on Xanax prescribed by her PCP; advised to discontinue it  The patient demonstrates the following risk factors for suicide: Chronic risk factors for suicide include:psychiatric disorder ofdepressionand chronic pain. Acute risk factorsfor suicide include: unemployment. Protective factorsfor this patient include: responsibility  to others (children, family), coping skills and hope for the future. Considering these factors,  the overall suicide risk at this point appears to below. Patientisappropriate for outpatient follow up.    Norman Clay, MD 11/25/2017, 8:05 AM

## 2017-11-29 ENCOUNTER — Ambulatory Visit (HOSPITAL_COMMUNITY): Payer: Medicaid Other | Admitting: Psychiatry

## 2017-12-27 ENCOUNTER — Other Ambulatory Visit (HOSPITAL_COMMUNITY): Payer: Self-pay | Admitting: Psychiatry

## 2018-01-23 ENCOUNTER — Other Ambulatory Visit (HOSPITAL_COMMUNITY): Payer: Self-pay | Admitting: Psychiatry

## 2018-03-15 ENCOUNTER — Other Ambulatory Visit: Payer: Self-pay | Admitting: Family Medicine

## 2018-03-15 DIAGNOSIS — Z1231 Encounter for screening mammogram for malignant neoplasm of breast: Secondary | ICD-10-CM

## 2018-05-06 ENCOUNTER — Emergency Department (HOSPITAL_COMMUNITY)
Admission: EM | Admit: 2018-05-06 | Discharge: 2018-05-06 | Disposition: A | Discharge status: Home / Self Care | Payer: Medicaid Other | Attending: Emergency Medicine | Admitting: Emergency Medicine

## 2018-05-06 ENCOUNTER — Emergency Department (HOSPITAL_COMMUNITY): Payer: Medicaid Other

## 2018-05-06 ENCOUNTER — Encounter (HOSPITAL_COMMUNITY): Payer: Self-pay

## 2018-05-06 DIAGNOSIS — Z79899 Other long term (current) drug therapy: Secondary | ICD-10-CM | POA: Insufficient documentation

## 2018-05-06 DIAGNOSIS — Z7982 Long term (current) use of aspirin: Secondary | ICD-10-CM | POA: Diagnosis not present

## 2018-05-06 DIAGNOSIS — F329 Major depressive disorder, single episode, unspecified: Secondary | ICD-10-CM | POA: Diagnosis not present

## 2018-05-06 DIAGNOSIS — I10 Essential (primary) hypertension: Secondary | ICD-10-CM | POA: Insufficient documentation

## 2018-05-06 DIAGNOSIS — R05 Cough: Secondary | ICD-10-CM

## 2018-05-06 DIAGNOSIS — R059 Cough, unspecified: Secondary | ICD-10-CM

## 2018-05-06 DIAGNOSIS — R0989 Other specified symptoms and signs involving the circulatory and respiratory systems: Secondary | ICD-10-CM | POA: Diagnosis present

## 2018-05-06 DIAGNOSIS — J45909 Unspecified asthma, uncomplicated: Secondary | ICD-10-CM | POA: Insufficient documentation

## 2018-05-06 DIAGNOSIS — F419 Anxiety disorder, unspecified: Secondary | ICD-10-CM | POA: Insufficient documentation

## 2018-05-06 DIAGNOSIS — T17308A Unspecified foreign body in larynx causing other injury, initial encounter: Secondary | ICD-10-CM

## 2018-05-06 NOTE — Discharge Instructions (Addendum)
You may have another narrowing.  Follow-up with your gastroenterologist

## 2018-05-06 NOTE — ED Provider Notes (Signed)
[facility] EMERGENCY DEPARTMENT Provider Note   CSN: [id number] Arrival date & time: 05/06/18  2153     History   Chief Complaint Chief Complaint  Patient presents with  . Choking    HPI Heather Perry is a 52 y.o. female.  HPI Patient presents with coughing and feeling food getting stuck in her throat.  Has had previous esophageal narrowing and had to get stretched.  States she is been able to eat well the last few days up until the day but states she has not been able to sleep well.  States when she lays flat she feels stuff come up in her throat and has to cough.  No chest pain.  No real trouble breathing.  She states she is on an acid already. Past Medical History:  Diagnosis Date  . Allergy   . Anxiety   . Arthritis   . Asthma   . Blood transfusion without reported diagnosis   . Depression   . GERD (gastroesophageal reflux disease)   . High cholesterol   . Hypertension   . Obesity   . Osteoporosis   . Recurrent boils   . Seizures ([place])    last seizure "3 years ago" per pt 06-16-16 KBW  . Sleep apnea    no CPAP used  . Uterine fibroid     Patient Active Problem List   Diagnosis Date Noted  . Anxiety disorder 06/28/2017  . MDD (major depressive disorder), recurrent episode, mild ([place]) 06/28/2017  . Essential hypertension 06/26/2017  . Hyperlipidemia with target LDL less than 100 06/26/2017  . Chest pain with low risk for cardiac etiology 06/26/2017  . 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 Rash and nonspecific skin eruption 04/13/2017    Past Surgical History:  Procedure Laterality Date  . ABDOMINAL HYSTERECTOMY    . CESAREAN SECTION    . KNEE SURGERY Right    x2  . UPPER GASTROINTESTINAL ENDOSCOPY       OB History   None      Home Medications    Prior to Admission medications   Medication Sig Start Date End Date Taking? Authorizing Provider  albuterol (PROVENTIL HFA;VENTOLIN  HFA) 108 (90 Base) MCG/ACT inhaler Inhale 2 puffs into the lungs every 6 (six) hours as needed for wheezing or shortness of breath. 05/18/16  Yes Micheline Chapman, NP  AMITIZA 24 MCG capsule Take 24 mcg by mouth 2 (two) times daily with a meal.  05/26/17  Yes [provider]  aspirin EC 81 MG tablet Take 81 mg by mouth daily.   Yes [provider]  budesonide-formoterol (SYMBICORT) 160-4.5 MCG/ACT inhaler Inhale 2 puffs into the lungs 2 (two) times daily. Patient taking differently: Inhale 2 puffs into the lungs 2 (two) times daily as needed (wheezing, sob).  05/18/16  Yes Micheline Chapman, NP  celecoxib (CELEBREX) 200 MG capsule Take 1 capsule (200 mg total) by mouth 2 (two) times daily. Patient taking differently: Take 200 mg by mouth daily.  05/18/16  Yes Micheline Chapman, NP  cetirizine (ZYRTEC) 10 MG tablet Take 1 tablet by mouth daily. 05/02/17 05/06/26 Yes [provider]  citalopram (CELEXA) 40 MG tablet Take 1 tablet (40 mg total) by mouth daily. 09/27/17  Yes Cloria Spring, MD  esomeprazole (NEXIUM) 40 MG capsule Take 1 capsule (40 mg total) by mouth daily at 12 noon. 05/18/16  Yes Micheline Chapman, NP  famotidine (PEPCID) 20 MG  tablet Take 1 tablet (20 mg total) by mouth 2 (two) times daily. 04/20/17  Yes Khatri, Hina, PA-C  gabapentin (NEURONTIN) 300 MG capsule Take 2 capsules (600 mg total) by mouth 3 (three) times daily. 10/16/17  Yes Clent Demark, PA-C  hydrochlorothiazide (HYDRODIURIL) 25 MG tablet Take 1 tablet (25 mg total) by mouth daily. 05/18/16  Yes Micheline Chapman, NP  hydroxychloroquine (PLAQUENIL) 200 MG tablet Take 1 tablet by mouth 2 (two) times daily.  03/21/17  Yes [provider]  hydrOXYzine (ATARAX/VISTARIL) 25 MG tablet Take 1 tablet (25 mg total) by mouth at bedtime as needed for anxiety (insomnia). 07/31/17  Yes Hisada, Elie Goody, MD  ipratropium-albuterol (DUONEB) 0.5-2.5 (3) MG/3ML SOLN Take 3 mLs by nebulization every 6  (six) hours as needed. Patient taking differently: Take 3 mLs by nebulization every 6 (six) hours as needed (for SOB).  05/18/16  Yes Micheline Chapman, NP  phenytoin (DILANTIN) 100 MG ER capsule Take 1 capsule (100 mg total) by mouth 3 (three) times daily. 05/18/16  Yes Micheline Chapman, NP  pravastatin (PRAVACHOL) 20 MG tablet Take 1 tablet (20 mg total) by mouth daily. 05/18/16  Yes Micheline Chapman, NP  QUEtiapine (SEROQUEL) 100 MG tablet Take 100 mg by mouth at bedtime. 04/21/18  Yes [provider]  acyclovir ointment (ZOVIRAX) 5 % Apply 1 application topically every 3 (three) hours. Patient not taking: Reported on 05/06/2018 05/18/16   Micheline Chapman, NP  aspirin 325 MG EC tablet TAKE 1 TABLET (325 MG TOTAL) BY MOUTH DAILY. Patient not taking: Reported on 05/06/2018 09/12/16   Dorena Dew, FNP  chlorhexidine (HIBICLENS) 4 % external liquid Apply topically daily as needed. Patient not taking: Reported on 05/06/2018 12/12/16   Clent Demark, PA-C  ergocalciferol (VITAMIN D2) 50000 units capsule Take 1 capsule (50,000 Units total) by mouth once a week. Patient not taking: Reported on 05/06/2018 05/18/16   Micheline Chapman, NP  [place]. Devices (STEP N REST II WALKER) MISC 1 each by Does not apply route daily. 03/08/17   Clent Demark, PA-C  mupirocin cream (BACTROBAN) 2 % Apply 1 application topically 2 (two) times daily. To breast Patient not taking: Reported on 05/06/2018 04/13/17   Lucila Maine C, DO  triamcinolone cream (KENALOG) 0.1 % Apply topically 2 (two) times daily. Patient not taking: Reported on 05/06/2018 05/23/17   Clent Demark, PA-C  VOLTAREN 1 % GEL APPLY THREE TIMES PER DAY AS NEEDED TO AFFECTED AREA 06/16/17   [provider]    Family History Family History  Problem Relation Age of Onset  . Breast cancer Maternal Aunt        unsure how old of onset  . Breast cancer Maternal Aunt        unsure how old of onset  . Colon  cancer Neg Hx   . Esophageal cancer Neg Hx   . Rectal cancer Neg Hx   . Stomach cancer Neg Hx     Social History Social History   Tobacco Use  . Smoking status: Never Smoker  . Smokeless tobacco: Never Used  Substance Use Topics  . Alcohol use: No  . Drug use: No    Types: "Crack" cocaine    Comment: last smoked 2011     Allergies   Lortab [hydrocodone-acetaminophen] and Penicillins   Review of Systems Review of Systems  Constitutional: Negative for appetite change.  HENT: Negative for dental problem, facial swelling, rhinorrhea, trouble swallowing and  voice change.   Respiratory: Positive for cough and choking. Negative for shortness of breath.   Cardiovascular: Negative for chest pain.  Gastrointestinal: Negative for abdominal pain.  Genitourinary: Negative for flank pain.  Musculoskeletal: Negative for back pain.  Skin: Negative for rash.  Neurological: Negative for weakness and numbness.  Psychiatric/Behavioral: Negative for confusion.     Physical Exam Updated Vital Signs BP 109/66   Pulse (!) 103   Temp 97.6 F (36.4 C)   Resp 20   SpO2 94%   Physical Exam  Constitutional: She appears well-developed.  Eyes: Pupils are equal, round, and reactive to light.  Neck: Neck supple.  Cardiovascular: Normal rate.  Pulmonary/Chest: Effort normal.  Abdominal: Soft.  Musculoskeletal: She exhibits no edema.  Neurological: She is alert.  Skin: Skin is warm. Capillary refill takes less than 2 seconds.     ED Treatments / Results  Labs (all labs ordered are listed, but only abnormal results are displayed) Labs Reviewed - No data to display  EKG None  Radiology Dg Neck Soft Tissue  Result Date: 05/06/2018 CLINICAL DATA:  Choking sensation.  Possible foreign body. EXAM: NECK SOFT TISSUES - 1+ VIEW COMPARISON:  None. FINDINGS: There is no evidence of retropharyngeal soft tissue swelling or epiglottic enlargement. The cervical airway is unremarkable and no  radio-opaque foreign body identified. IMPRESSION: No radiopaque foreign bodies identified. Electronically Signed   By: Lucienne Capers M.D.   On: 05/06/2018 23:10   Dg Chest 2 View  Result Date: 05/06/2018 CLINICAL DATA:  Choking sensation. History of previous esophagus stretching. Cough and shortness of breath. EXAM: CHEST - 2 VIEW COMPARISON:  04/20/2017 FINDINGS: The heart size and mediastinal contours are within normal limits. Both lungs are clear. The visualized skeletal structures are unremarkable. No radiopaque foreign bodies demonstrated in the mediastinum. IMPRESSION: No active cardiopulmonary disease. Electronically Signed   By: Lucienne Capers M.D.   On: 05/06/2018 23:08    Procedures Procedures (including critical care time)  Medications Ordered in ED Medications - No data to display   Initial Impression / Assessment and Plan / ED Course  I have reviewed the triage vital signs and the nursing notes.  Pertinent labs & imaging results that were available during my care of the patient were reviewed by me and considered in my medical decision making (see chart for details).     Patient feeling food get caught in her throat.  Is on the left side.  Has a history of Sjogren and if esophageal narrowing.  X-ray of both chest and neck okay.  She is handling her secretions.  I think at this point she is safe for discharge to follow-up with her gastroenterologist.  Will discharge home.  Doubt this is cardiac or other cause.  Final Clinical Impressions(s) / ED Diagnoses   Final diagnoses:  Choking, initial encounter  Cough    ED Discharge Orders    None       Davonna Belling, MD 05/06/18 2332

## 2018-05-06 NOTE — ED Triage Notes (Signed)
Pt comes via [facility] for a choking sensation, states she had cheese toast for dinner, esophagus stretched in the past, airway intact, able to control secretions. Denies pain

## 2018-05-08 ENCOUNTER — Institutional Professional Consult (permissible substitution): Payer: Self-pay | Admitting: Pulmonary Disease

## 2018-05-31 ENCOUNTER — Ambulatory Visit
Admission: RE | Admit: 2018-05-31 | Discharge: 2018-05-31 | Disposition: A | Discharge status: Home / Self Care | Payer: Medicaid Other | Source: Ambulatory Visit | Attending: Family Medicine | Admitting: Family Medicine

## 2018-05-31 ENCOUNTER — Encounter (HOSPITAL_COMMUNITY): Payer: Self-pay | Admitting: Emergency Medicine

## 2018-05-31 ENCOUNTER — Emergency Department (HOSPITAL_COMMUNITY)
Admission: EM | Admit: 2018-05-31 | Discharge: 2018-05-31 | Disposition: A | Discharge status: Home / Self Care | Payer: Medicaid Other | Attending: Emergency Medicine | Admitting: Emergency Medicine

## 2018-05-31 DIAGNOSIS — I10 Essential (primary) hypertension: Secondary | ICD-10-CM | POA: Insufficient documentation

## 2018-05-31 DIAGNOSIS — L819 Disorder of pigmentation, unspecified: Secondary | ICD-10-CM

## 2018-05-31 DIAGNOSIS — J45909 Unspecified asthma, uncomplicated: Secondary | ICD-10-CM | POA: Diagnosis not present

## 2018-05-31 DIAGNOSIS — Z7982 Long term (current) use of aspirin: Secondary | ICD-10-CM | POA: Insufficient documentation

## 2018-05-31 DIAGNOSIS — N644 Mastodynia: Secondary | ICD-10-CM | POA: Insufficient documentation

## 2018-05-31 DIAGNOSIS — Z1231 Encounter for screening mammogram for malignant neoplasm of breast: Secondary | ICD-10-CM

## 2018-05-31 DIAGNOSIS — Z79899 Other long term (current) drug therapy: Secondary | ICD-10-CM | POA: Diagnosis not present

## 2018-05-31 DIAGNOSIS — R21 Rash and other nonspecific skin eruption: Secondary | ICD-10-CM | POA: Diagnosis present

## 2018-05-31 NOTE — ED Provider Notes (Signed)
[facility] EMERGENCY DEPARTMENT Provider Note   CSN: [id number] Arrival date & time: 05/31/18  1143     History   Chief Complaint Chief Complaint  Patient presents with  . Breast Pain    HPI Heather Perry is a 52 y.o. female.  The history is provided by the patient. No language interpreter was used.  Rash   This is a new problem. The current episode started more than 2 days ago. The problem has been gradually worsening. The problem is associated with nothing. There has been no fever. The rash is present on the face. The pain has been constant since onset. She has tried nothing for the symptoms. The treatment provided no relief.   Pt reports her skin on her face is becoming discolored.  Pt reports she has had pain in her right breast for 2 weeks.  Pt was told she needed a diagnostic mammogram.  Pt reports her MD has ordered at [facility] but will not be done until next year.  Pt was wanting to have done here  Past Medical History:  Diagnosis Date  . Allergy   . Anxiety   . Arthritis   . Asthma   . Blood transfusion without reported diagnosis   . Depression   . GERD (gastroesophageal reflux disease)   . High cholesterol   . Hypertension   . Obesity   . Osteoporosis   . Recurrent boils   . Seizures ([place])    last seizure "3 years ago" per pt 06-16-16 KBW  . Sleep apnea    no CPAP used  . Uterine fibroid     Patient Active Problem List   Diagnosis Date Noted  . Anxiety disorder 06/28/2017  . MDD (major depressive disorder), recurrent episode, mild ([place]) 06/28/2017  . Essential hypertension 06/26/2017  . Hyperlipidemia with target LDL less than 100 06/26/2017  . Chest pain with low risk for cardiac etiology 06/26/2017  . 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 Rash and nonspecific skin eruption 04/13/2017    Past Surgical History:  Procedure Laterality Date  . ABDOMINAL HYSTERECTOMY    .  CESAREAN SECTION    . KNEE SURGERY Right    x2  . UPPER GASTROINTESTINAL ENDOSCOPY       OB History   No obstetric history on file.      Home Medications    Prior to Admission medications   Medication Sig Start Date End Date Taking? Authorizing Provider  acyclovir ointment (ZOVIRAX) 5 % Apply 1 application topically every 3 (three) hours. Patient not taking: Reported on 05/06/2018 05/18/16   Micheline Chapman, NP  albuterol (PROVENTIL HFA;VENTOLIN HFA) 108 (90 Base) MCG/ACT inhaler Inhale 2 puffs into the lungs every 6 (six) hours as needed for wheezing or shortness of breath. 05/18/16   Micheline Chapman, NP  AMITIZA 24 MCG capsule Take 24 mcg by mouth 2 (two) times daily with a meal.  05/26/17   [provider]  aspirin 325 MG EC tablet TAKE 1 TABLET (325 MG TOTAL) BY MOUTH DAILY. Patient not taking: Reported on 05/06/2018 09/12/16   Dorena Dew, FNP  aspirin EC 81 MG tablet Take 81 mg by mouth daily.    [provider]  budesonide-formoterol (SYMBICORT) 160-4.5 MCG/ACT inhaler Inhale 2 puffs into the lungs 2 (two) times daily. Patient taking differently: Inhale 2 puffs into the lungs 2 (two) times daily as needed (wheezing, sob).  05/18/16   Bernhardt,  Glynis Smiles, NP  celecoxib (CELEBREX) 200 MG capsule Take 1 capsule (200 mg total) by mouth 2 (two) times daily. Patient taking differently: Take 200 mg by mouth daily.  05/18/16   Micheline Chapman, NP  cetirizine (ZYRTEC) 10 MG tablet Take 1 tablet by mouth daily. 05/02/17 05/06/26  [provider]  chlorhexidine (HIBICLENS) 4 % external liquid Apply topically daily as needed. Patient not taking: Reported on 05/06/2018 12/12/16   Clent Demark, PA-C  citalopram (CELEXA) 40 MG tablet Take 1 tablet (40 mg total) by mouth daily. 09/27/17   Cloria Spring, MD  ergocalciferol (VITAMIN D2) 50000 units capsule Take 1 capsule (50,000 Units total) by mouth once a week. Patient not taking: Reported on  05/06/2018 05/18/16   Micheline Chapman, NP  esomeprazole (NEXIUM) 40 MG capsule Take 1 capsule (40 mg total) by mouth daily at 12 noon. 05/18/16   Micheline Chapman, NP  famotidine (PEPCID) 20 MG tablet Take 1 tablet (20 mg total) by mouth 2 (two) times daily. 04/20/17   Khatri, Hina, PA-C  gabapentin (NEURONTIN) 300 MG capsule Take 2 capsules (600 mg total) by mouth 3 (three) times daily. 10/16/17   Clent Demark, PA-C  hydrochlorothiazide (HYDRODIURIL) 25 MG tablet Take 1 tablet (25 mg total) by mouth daily. 05/18/16   Micheline Chapman, NP  hydroxychloroquine (PLAQUENIL) 200 MG tablet Take 1 tablet by mouth 2 (two) times daily.  03/21/17   [provider]  hydrOXYzine (ATARAX/VISTARIL) 25 MG tablet Take 1 tablet (25 mg total) by mouth at bedtime as needed for anxiety (insomnia). 07/31/17   Norman Clay, MD  ipratropium-albuterol (DUONEB) 0.5-2.5 (3) MG/3ML SOLN Take 3 mLs by nebulization every 6 (six) hours as needed. Patient taking differently: Take 3 mLs by nebulization every 6 (six) hours as needed (for SOB).  05/18/16   Micheline Chapman, NP  Misc. Devices (STEP N REST II WALKER) MISC 1 each by Does not apply route daily. 03/08/17   Clent Demark, PA-C  mupirocin cream (BACTROBAN) 2 % Apply 1 application topically 2 (two) times daily. To breast Patient not taking: Reported on 05/06/2018 04/13/17   Steve Rattler, DO  phenytoin (DILANTIN) 100 MG ER capsule Take 1 capsule (100 mg total) by mouth 3 (three) times daily. 05/18/16   Micheline Chapman, NP  pravastatin (PRAVACHOL) 20 MG tablet Take 1 tablet (20 mg total) by mouth daily. 05/18/16   Micheline Chapman, NP  QUEtiapine (SEROQUEL) 100 MG tablet Take 100 mg by mouth at bedtime. 04/21/18   [provider]  triamcinolone cream (KENALOG) 0.1 % Apply topically 2 (two) times daily. Patient not taking: Reported on 05/06/2018 05/23/17   Clent Demark, PA-C  VOLTAREN 1 % GEL APPLY THREE TIMES PER DAY AS NEEDED TO  AFFECTED AREA 06/16/17   [provider]    Family History Family History  Problem Relation Age of Onset  . Breast cancer Maternal Aunt        unsure how old of onset  . Breast cancer Maternal Aunt        unsure how old of onset  . Colon cancer Neg Hx   . Esophageal cancer Neg Hx   . Rectal cancer Neg Hx   . Stomach cancer Neg Hx     Social History Social History   Tobacco Use  . Smoking status: Never Smoker  . Smokeless tobacco: Never Used  Substance Use Topics  . Alcohol use: No  .  Drug use: No    Types: "Crack" cocaine    Comment: last smoked 2011     Allergies   Lortab [hydrocodone-acetaminophen] and Penicillins   Review of Systems Review of Systems  Skin: Positive for rash.  All other systems reviewed and are negative.    Physical Exam Updated Vital Signs Temp 98.6 F (37 C) (Oral)   Physical Exam Vitals signs and nursing note reviewed.  Constitutional:      Appearance: Normal appearance.  HENT:     Head:     Comments: Face discolored areas     Nose: Nose normal.  Eyes:     Pupils: Pupils are equal, round, and reactive to light.  Neck:     Musculoskeletal: Normal range of motion.  Cardiovascular:     Rate and Rhythm: Normal rate.     Comments: Tender right breast, no obvious mass  Pulmonary:     Effort: Pulmonary effort is normal.  Abdominal:     General: Abdomen is flat.  Musculoskeletal: Normal range of motion.  Skin:    General: Skin is warm.  Neurological:     General: No focal deficit present.     Mental Status: She is alert.  Psychiatric:        Mood and Affect: Mood normal.      ED Treatments / Results  Labs (all labs ordered are listed, but only abnormal results are displayed) Labs Reviewed - No data to display  EKG None  Radiology No results found.  Procedures Procedures (including critical care time)  Medications Ordered in ED Medications - No data to display   Initial Impression / Assessment and  Plan / ED Course  I have reviewed the triage vital signs and the nursing notes.  Pertinent labs & imaging results that were available during my care of the patient were reviewed by me and considered in my medical decision making (see chart for details).       Final Clinical Impressions(s) / ED Diagnoses   Final diagnoses:  Discolored skin  Breast pain    ED Discharge Orders    None    An After Visit Summary was printed and given to the patient.    Fransico Meadow, [place] 05/31/18 1248    Virgel Manifold, MD 06/01/18 [id number]

## 2018-05-31 NOTE — ED Triage Notes (Signed)
Pain for 2 weeks worsening

## 2018-05-31 NOTE — ED Triage Notes (Signed)
Pt here for left breast pain- no lumps or discharge from nipple- no changes. Reports just aches. She reports that she went to mammogram center and wouldn't do it without order.

## 2018-05-31 NOTE — Discharge Instructions (Signed)
Return if any problems. Call Breast center to schedule mammogram

## 2018-06-01 ENCOUNTER — Other Ambulatory Visit: Payer: Self-pay | Admitting: Family Medicine

## 2018-06-01 DIAGNOSIS — N644 Mastodynia: Secondary | ICD-10-CM

## 2018-06-07 ENCOUNTER — Ambulatory Visit
Admission: RE | Admit: 2018-06-07 | Discharge: 2018-06-07 | Disposition: A | Discharge status: Home / Self Care | Payer: Medicaid Other | Source: Ambulatory Visit | Attending: Family Medicine | Admitting: Family Medicine

## 2018-06-07 ENCOUNTER — Ambulatory Visit: Payer: Self-pay

## 2018-06-07 DIAGNOSIS — N644 Mastodynia: Secondary | ICD-10-CM

## 2018-07-04 ENCOUNTER — Encounter: Payer: Self-pay | Admitting: Pulmonary Disease

## 2018-07-04 ENCOUNTER — Ambulatory Visit (INDEPENDENT_AMBULATORY_CARE_PROVIDER_SITE_OTHER): Payer: Medicaid Other | Admitting: Pulmonary Disease

## 2018-07-04 VITALS — BP 146/70 | HR 87 | Ht 62.0 in | Wt 232.8 lb

## 2018-07-04 DIAGNOSIS — R0683 Snoring: Secondary | ICD-10-CM | POA: Diagnosis not present

## 2018-07-04 NOTE — Progress Notes (Signed)
Heather Perry    [id number]    February 25, 1966  Primary Care Physician:Howell, Bryn Gulling, MD  Referring Physician: Helane Rima, MD [address] [place], [place] [id number]  Chief complaint:   Patient with a history of snoring  HPI:  Previous sleep study-was told that she snored Was not advised about needing CPAP at the time This was over 8 years ago  Usually tries to go to bed at 10 PM May take out between 30 minutes to 1 hour to fall asleep Wakes up about 2-3 times a night Final wake up time about 10 AM  Describes choking episodes at night Sleep quality is better with use of Seroquel  Other comorbidities include hypertension, asthma, hypercholesterolemia She has a history of Sjogren's, history of rheumatoid arthritis   Outpatient Encounter Medications as of 07/04/2018  Medication Sig  . acyclovir ointment (ZOVIRAX) 5 % Apply 1 application topically every 3 (three) hours.  Marland Kitchen albuterol (PROVENTIL HFA;VENTOLIN HFA) 108 (90 Base) MCG/ACT inhaler Inhale 2 puffs into the lungs every 6 (six) hours as needed for wheezing or shortness of breath.  . AMITIZA 24 MCG capsule Take 24 mcg by mouth 2 (two) times daily with a meal.   . aspirin EC 81 MG tablet Take 81 mg by mouth daily.  . budesonide-formoterol (SYMBICORT) 160-4.5 MCG/ACT inhaler Inhale 2 puffs into the lungs 2 (two) times daily. (Patient taking differently: Inhale 2 puffs into the lungs 2 (two) times daily as needed (wheezing, sob). )  . celecoxib (CELEBREX) 200 MG capsule Take 1 capsule (200 mg total) by mouth 2 (two) times daily. (Patient taking differently: Take 200 mg by mouth daily. )  . cetirizine (ZYRTEC) 10 MG tablet Take 1 tablet by mouth daily.  . chlorhexidine (HIBICLENS) 4 % external liquid Apply topically daily as needed.  . citalopram (CELEXA) 40 MG tablet Take 1 tablet (40 mg total) by mouth daily.  . ergocalciferol (VITAMIN D2) 50000 units capsule Take 1 capsule (50,000 Units  total) by mouth once a week.  . esomeprazole (NEXIUM) 40 MG capsule Take 1 capsule (40 mg total) by mouth daily at 12 noon.  . famotidine (PEPCID) 20 MG tablet Take 1 tablet (20 mg total) by mouth 2 (two) times daily.  Marland Kitchen gabapentin (NEURONTIN) 300 MG capsule Take 2 capsules (600 mg total) by mouth 3 (three) times daily.  . hydrochlorothiazide (HYDRODIURIL) 25 MG tablet Take 1 tablet (25 mg total) by mouth daily.  . hydroxychloroquine (PLAQUENIL) 200 MG tablet Take 1 tablet by mouth 2 (two) times daily.   . hydrOXYzine (ATARAX/VISTARIL) 25 MG tablet Take 1 tablet (25 mg total) by mouth at bedtime as needed for anxiety (insomnia).  Marland Kitchen ipratropium-albuterol (DUONEB) 0.5-2.5 (3) MG/3ML SOLN Take 3 mLs by nebulization every 6 (six) hours as needed. (Patient taking differently: Take 3 mLs by nebulization every 6 (six) hours as needed (for SOB). )  . Misc. Devices (STEP N REST II WALKER) MISC 1 each by Does not apply route daily.  . mupirocin cream (BACTROBAN) 2 % Apply 1 application topically 2 (two) times daily. To breast  . phenytoin (DILANTIN) 100 MG ER capsule Take 1 capsule (100 mg total) by mouth 3 (three) times daily.  . pravastatin (PRAVACHOL) 20 MG tablet Take 1 tablet (20 mg total) by mouth daily.  . QUEtiapine (SEROQUEL) 100 MG tablet Take 100 mg by mouth at bedtime.  . triamcinolone cream (KENALOG) 0.1 % Apply topically 2 (two) times  daily.  . VOLTAREN 1 % GEL APPLY THREE TIMES PER DAY AS NEEDED TO AFFECTED AREA  . [DISCONTINUED] aspirin 325 MG EC tablet TAKE 1 TABLET (325 MG TOTAL) BY MOUTH DAILY.   Facility-Administered Encounter Medications as of 07/04/2018  Medication  . 0.9 %  sodium chloride infusion    Allergies as of 07/04/2018 - Review Complete 07/04/2018  Allergen Reaction Noted  . Lortab [hydrocodone-acetaminophen]  04/09/2016  . Penicillins Itching 04/09/2016    Past Medical History:  Diagnosis Date  . Allergy   . Anxiety   . Arthritis   . Asthma   . Blood  transfusion without reported diagnosis   . Depression   . GERD (gastroesophageal reflux disease)   . High cholesterol   . Hypertension   . Obesity   . Osteoporosis   . Recurrent boils   . Seizures ([place])    last seizure "3 years ago" per pt 06-16-16 KBW  . Sleep apnea    no CPAP used  . Uterine fibroid     Past Surgical History:  Procedure Laterality Date  . ABDOMINAL HYSTERECTOMY    . CESAREAN SECTION    . KNEE SURGERY Right    x2  . UPPER GASTROINTESTINAL ENDOSCOPY      Family History  Problem Relation Age of Onset  . Breast cancer Maternal Aunt        unsure how old of onset  . Breast cancer Maternal Aunt        unsure how old of onset  . Colon cancer Neg Hx   . Esophageal cancer Neg Hx   . Rectal cancer Neg Hx   . Stomach cancer Neg Hx     Social History   Socioeconomic History  . Marital status: Single    Spouse name: Not on file  . Number of children: Not on file  . Years of education: Not on file  . Highest education level: Not on file  Occupational History  . Not on file  Social Needs  . Financial resource strain: Not on file  . Food insecurity:    Worry: Not on file    Inability: Not on file  . Transportation needs:    Medical: Not on file    Non-medical: Not on file  Tobacco Use  . Smoking status: Never Smoker  . Smokeless tobacco: Never Used  Substance and Sexual Activity  . Alcohol use: No  . Drug use: No    Types: "Crack" cocaine    Comment: last smoked 2011  . Sexual activity: Not on file  Lifestyle  . Physical activity:    Days per week: Not on file    Minutes per session: Not on file  . Stress: Not on file  Relationships  . Social connections:    Talks on phone: Not on file    Gets together: Not on file    Attends religious service: Not on file    Active member of club or organization: Not on file    Attends meetings of clubs or organizations: Not on file    Relationship status: Not on file  . Intimate partner violence:     Fear of current or ex partner: Not on file    Emotionally abused: Not on file    Physically abused: Not on file    Forced sexual activity: Not on file  Other Topics Concern  . Not on file  Social History Narrative  . Not on file    Review of  Systems  Constitutional: Negative.   HENT: Negative.   Eyes: Negative.   Respiratory: Positive for choking and shortness of breath.   Cardiovascular: Negative.   Gastrointestinal: Negative.   Musculoskeletal: Positive for arthralgias.  Psychiatric/Behavioral: Positive for sleep disturbance.    Vitals:   07/04/18 1141 07/04/18 1144  BP: (!) 146/70 (!) 146/70  Pulse:  87  SpO2:  99%     Physical Exam  Constitutional: She appears well-developed and well-nourished.  HENT:  Head: Normocephalic and atraumatic.  Mallampati 4, crowded oropharynx  Eyes: Pupils are equal, round, and reactive to light. Conjunctivae and EOM are normal. Right eye exhibits no discharge. Left eye exhibits no discharge.  Neck: Normal range of motion. Neck supple. No tracheal deviation present. No thyromegaly present.  Cardiovascular: Normal rate and regular rhythm.  Pulmonary/Chest: Effort normal and breath sounds normal. No respiratory distress. She has no wheezes.  Abdominal: Soft. Bowel sounds are normal. She exhibits no distension. There is no abdominal tenderness.   Results of the Epworth flowsheet 07/04/2018  Sitting and reading 0  Watching TV 1  Sitting, inactive in a public place (e.g. a theatre or a meeting) 0  As a passenger in a car for an hour without a break 0  Lying down to rest in the afternoon when circumstances permit 2  Sitting and talking to someone 0  Sitting quietly after a lunch without alcohol 0  In a car, while stopped for a few minutes in traffic 0  Total score 3   Assessment:   Moderate probability of significant sleep disordered breathing  Strep depression  Multiple comorbidities  Morbid  obesity  Plan/Recommendations:  Importance of weight loss and exercise discussed with the patient  We will set the patient up with a home sleep study  Pathophysiology of sleep disordered breathing discussed Treatment options discussed with the patient  I will see her back in the office in about 3 months  Encouraged to call with any significant concerns  Sherrilyn Rist MD [facility] 07/04/2018, 11:52 AM  CC: Helane Rima, MD

## 2018-07-04 NOTE — Addendum Note (Signed)
Addended by: Madolyn Frieze on: 07/04/2018 01:26 PM   Modules accepted: Orders

## 2018-07-04 NOTE — Patient Instructions (Signed)
History of snoring Moderate probability of significant sleep disordered breathing  We will order a home sleep study We will update you as results become available  I will see you back in the office in about 3 months Call with significant concerns Sleep Apnea Sleep apnea affects breathing during sleep. It causes breathing to stop for a short time or to become shallow. It can also increase the risk of:  Heart attack.  Stroke.  Being very overweight (obese).  Diabetes.  Heart failure.  Irregular heartbeat. The goal of treatment is to help you breathe normally again. What are the causes? There are three kinds of sleep apnea:  Obstructive sleep apnea. This is caused by a blocked or collapsed airway.  Central sleep apnea. This happens when the brain does not send the right signals to the muscles that control breathing.  Mixed sleep apnea. This is a combination of obstructive and central sleep apnea. The most common cause of this condition is a collapsed or blocked airway. This can happen if:  Your throat muscles are too relaxed.  Your tongue and tonsils are too large.  You are overweight.  Your airway is too small. What increases the risk?  Being overweight.  Smoking.  Having a small airway.  Being older.  Being female.  Drinking alcohol.  Taking medicines to calm yourself (sedatives or tranquilizers).  Having family members with the condition. What are the signs or symptoms?  Trouble staying asleep.  Being sleepy or tired during the day.  Getting angry a lot.  Loud snoring.  Headaches in the morning.  Not being able to focus your mind (concentrate).  Forgetting things.  Less interest in sex.  Mood swings.  Personality changes.  Feelings of sadness (depression).  Waking up a lot during the night to pee (urinate).  Dry mouth.  Sore throat. How is this diagnosed?  Your medical history.  A physical exam.  A test that is done when you  are sleeping (sleep study). The test is most often done in a sleep lab but may also be done at home. How is this treated?   Sleeping on your side.  Using a medicine to get rid of mucus in your nose (decongestant).  Avoiding the use of alcohol, medicines to help you relax, or certain pain medicines (narcotics).  Losing weight, if needed.  Changing your diet.  Not smoking.  Using a machine to open your airway while you sleep, such as: ? An oral appliance. This is a mouthpiece that shifts your lower jaw forward. ? A CPAP device. This device blows air through a mask when you breathe out (exhale). ? An EPAP device. This has valves that you put in each nostril. ? A BPAP device. This device blows air through a mask when you breathe in (inhale) and breathe out.  Having surgery if other treatments do not work. It is important to get treatment for sleep apnea. Without treatment, it can lead to:  High blood pressure.  Coronary artery disease.  In men, not being able to have an erection (impotence).  Reduced thinking ability. Follow these instructions at home: Lifestyle  Make changes that your doctor recommends.  Eat a healthy diet.  Lose weight if needed.  Avoid alcohol, medicines to help you relax, and some pain medicines.  Do not use any products that contain nicotine or tobacco, such as cigarettes, e-cigarettes, and chewing tobacco. If you need help quitting, ask your doctor. General instructions  Take over-the-counter and prescription medicines  only as told by your doctor.  If you were given a machine to use while you sleep, use it only as told by your doctor.  If you are having surgery, make sure to tell your doctor you have sleep apnea. You may need to bring your device with you.  Keep all follow-up visits as told by your doctor. This is important. Contact a doctor if:  The machine that you were given to use during sleep bothers you or does not seem to be  working.  You do not get better.  You get worse. Get help right away if:  Your chest hurts.  You have trouble breathing in enough air.  You have an uncomfortable feeling in your back, arms, or stomach.  You have trouble talking.  One side of your body feels weak.  A part of your face is hanging down. These symptoms may be an emergency. Do not wait to see if the symptoms will go away. Get medical help right away. Call your local emergency services (911 in the [place] Do not drive yourself to the hospital. Summary  This condition affects breathing during sleep.  The most common cause is a collapsed or blocked airway.  The goal of treatment is to help you breathe normally while you sleep. This information is not intended to replace advice given to you by your health care provider. Make sure you discuss any questions you have with your health care provider. Document Released: 03/15/2008 Document Revised: 01/30/2018 Document Reviewed: 01/30/2018 Elsevier Interactive Patient Education  [employer].

## 2018-08-02 ENCOUNTER — Encounter (HOSPITAL_BASED_OUTPATIENT_CLINIC_OR_DEPARTMENT_OTHER): Payer: Self-pay

## 2018-09-18 ENCOUNTER — Encounter (HOSPITAL_BASED_OUTPATIENT_CLINIC_OR_DEPARTMENT_OTHER): Payer: Self-pay

## 2018-12-06 ENCOUNTER — Institutional Professional Consult (permissible substitution): Payer: Medicaid Other | Admitting: Internal Medicine

## 2019-05-07 ENCOUNTER — Other Ambulatory Visit: Payer: Self-pay | Admitting: Family Medicine

## 2019-05-07 DIAGNOSIS — Z1231 Encounter for screening mammogram for malignant neoplasm of breast: Secondary | ICD-10-CM

## 2019-05-10 ENCOUNTER — Ambulatory Visit: Payer: Self-pay

## 2019-05-10 ENCOUNTER — Ambulatory Visit (INDEPENDENT_AMBULATORY_CARE_PROVIDER_SITE_OTHER): Payer: Medicaid Other | Admitting: Orthopaedic Surgery

## 2019-05-10 ENCOUNTER — Other Ambulatory Visit: Payer: Self-pay

## 2019-05-10 ENCOUNTER — Telehealth: Payer: Self-pay | Admitting: Orthopaedic Surgery

## 2019-05-10 ENCOUNTER — Encounter: Payer: Self-pay | Admitting: Orthopaedic Surgery

## 2019-05-10 ENCOUNTER — Ambulatory Visit (INDEPENDENT_AMBULATORY_CARE_PROVIDER_SITE_OTHER): Payer: Medicaid Other

## 2019-05-10 VITALS — Ht 62.0 in | Wt 225.0 lb

## 2019-05-10 DIAGNOSIS — Z96651 Presence of right artificial knee joint: Secondary | ICD-10-CM

## 2019-05-10 DIAGNOSIS — G8929 Other chronic pain: Secondary | ICD-10-CM | POA: Diagnosis not present

## 2019-05-10 DIAGNOSIS — M545 Low back pain, unspecified: Secondary | ICD-10-CM

## 2019-05-10 DIAGNOSIS — M25561 Pain in right knee: Secondary | ICD-10-CM

## 2019-05-10 NOTE — Telephone Encounter (Signed)
Patient called and wanted copy of xrays on CD. Call patient when ready [phone]

## 2019-05-10 NOTE — Progress Notes (Addendum)
Office Visit Note   [name]           Date of Birth: July 23, 1965           MRN: [id number] Visit Date: 05/10/2019              Requested by: Helane Rima, MD [address],  [place] [id number] PCP: Helane Rima, MD   Assessment & Plan: Visit Diagnoses:  1. Chronic bilateral low back pain, unspecified whether sciatica present   2. Chronic pain of right knee     Plan: Patient needs to work on weight loss.  She is not a good operative candidate due to the high narcotic dose she is currently on as well as increased BMI.  She needs to work on right quad exercises which we went over which should help her right leg symptoms help her ambulation and also improve her back symptoms.  She gets tired and short of breath with ambulation and Rollator walker prescription given.  Follow-up here as needed.  Follow-Up Instructions: Return if symptoms worsen or fail to improve.   Orders:  Orders Placed This Encounter  Procedures   XR Lumbar Spine 2-3 Views   XR Knee 1-2 Views Right   No orders of the defined types were placed in this encounter.     Procedures: No procedures performed   Clinical Data: No additional findings.   Subjective: Chief Complaint  Patient presents with   Lower Back - Pain   Right Knee - Pain    HPI 53 year old female returns have not seen her in 2 years she is here with low back pain and painful knee on the right knee which had total knee arthroplasty in [place] several years ago.  Patient is on chronic narcotics she takes Roxicodone 20 mg  4X per day. ( total 80 mg oxycodone per day) She has right knee pain worse than left .  Patient complains of back pain with ambulation she states she also gets short of breath and her right leg gets sore sometimes has some swelling at the end of the day.  Review of Systems 14 point review of system positive for anxiety, depression.  Chronic narcotic pain management.   Previous total knee arthroplasty right.  Positive hypertension.  Otherwise negative as pertains HPI.   Objective: Vital Signs: Ht 5\' 2"  (1.575 m)    Wt 225 lb (102.1 kg)    BMI 41.15 kg/m   Physical Exam Constitutional:      Appearance: She is well-developed.  HENT:     Head: Normocephalic.     Right Ear: External ear normal.     Left Ear: External ear normal.  Eyes:     Pupils: Pupils are equal, round, and reactive to light.  Neck:     Thyroid: No thyromegaly.     Trachea: No tracheal deviation.  Cardiovascular:     Rate and Rhythm: Normal rate.  Pulmonary:     Comments: Patient has some increased respiratory effort no audible wheezing.  Increased respiratory rate which she relates to her mask. Abdominal:     Palpations: Abdomen is soft.  Skin:    General: Skin is warm and dry.  Neurological:     Mental Status: She is alert and oriented to person, place, and time.  Psychiatric:        Behavior: Behavior normal.     Ortho Exam patient has morbid obesity.  Right quad is weak in  comparison to the left with resisted testing.  She has trace knee effusion right knee.  Collateral ligaments are stable good flexion extension no instability.  Opposite left knee shows mild crepitus with knee flexion extension negative logroll of the hips.  Negative straight leg raising 90 degrees.  Anterior tib EHL is intact.  Specialty Comments:  No specialty comments available.  Imaging: Standing AP both knees lateral right knee obtained and reviewed.  This shows cemented total knee arthroplasty on the right without evidence of loosening or subsidence.  Left knee shows some medial joint line narrowing consistent with moderate osteoarthritis medial compartment.  Negative for acute fracture.  No subluxation.  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  AP lateral lumbar spine x-rays demonstrate some  degenerative facet changes  grade 1 degenerative anterolisthesis at L4-5. Negative for acute  compression fracture.   Impression: Mild lumbar facet degeneration with grade 1 anterolisthesis  L4-5.  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   Rash and nonspecific skin eruption 04/13/2017   Past Medical History:  Diagnosis Date   Allergy    Anxiety    Arthritis    Asthma    Blood transfusion without reported diagnosis    Depression    GERD (gastroesophageal reflux disease)    High cholesterol    Hypertension    Obesity    Osteoporosis    Recurrent boils    Seizures ([place])    last seizure "3 years ago" per pt 06-16-16 KBW   Sleep apnea    no CPAP used   Uterine fibroid     Family History  Problem Relation Age of Onset   Breast cancer Maternal Aunt        unsure how old of onset   Breast cancer Maternal Aunt        unsure how old of onset   Colon cancer Neg Hx    Esophageal cancer Neg Hx    Rectal cancer Neg Hx    Stomach cancer Neg Hx     Past Surgical History:  Procedure Laterality Date   ABDOMINAL HYSTERECTOMY     CESAREAN SECTION     KNEE SURGERY Right    x2   UPPER GASTROINTESTINAL ENDOSCOPY     Social History   Occupational History   Not on file  Tobacco Use   Smoking status: Never Smoker   Smokeless tobacco: Never Used  Substance and Sexual Activity   Alcohol use: No   Drug use: No    Types: "Crack" cocaine    Comment: last smoked 2011   Sexual activity: Not on file

## 2019-05-13 NOTE — Telephone Encounter (Signed)
Left voicemail advising patient CD was ready for pickup at front desk.  

## 2019-05-13 NOTE — Telephone Encounter (Signed)
Can one of you please do this? thanks!

## 2019-05-14 ENCOUNTER — Telehealth: Payer: Self-pay | Admitting: Orthopaedic Surgery

## 2019-05-14 NOTE — Telephone Encounter (Signed)
Received vm from patient wanting to get copy of records. IC,lmvm advised need to sign records release form

## 2019-05-21 ENCOUNTER — Telehealth: Payer: Self-pay | Admitting: Orthopaedic Surgery

## 2019-05-21 NOTE — Telephone Encounter (Signed)
Ok .  Has chronic low back pain . thanks

## 2019-05-21 NOTE — Telephone Encounter (Signed)
rx completed and faxed. Patient aware.

## 2019-05-21 NOTE — Telephone Encounter (Signed)
[place] for rx for walker?

## 2019-05-21 NOTE — Telephone Encounter (Signed)
Patient called stating a Rx need to be faxed over to [facility] in order for her to received the Rolator walker. Patient said the Rx have to state why she need to Rolator walker for medicaid purposes. The fax# is [phone]   The ph# is [phone]

## 2019-05-23 ENCOUNTER — Telehealth: Payer: Self-pay | Admitting: Orthopaedic Surgery

## 2019-05-23 NOTE — Telephone Encounter (Signed)
OK - thanks

## 2019-05-23 NOTE — Telephone Encounter (Signed)
Patient called to request the x-ray results be sent over to [facility] in [place].  Her doctor asked her to get [place] to send them.  [id number].  Thank you.

## 2019-05-23 NOTE — Telephone Encounter (Signed)
Please advise. I did not see imaging results reported in last office note.

## 2019-05-24 NOTE — Telephone Encounter (Signed)
Can we send the last office note to her pain management physician?

## 2019-06-05 ENCOUNTER — Ambulatory Visit: Payer: Medicaid Other | Admitting: Orthopedic Surgery

## 2019-07-02 ENCOUNTER — Ambulatory Visit: Payer: Medicaid Other

## 2019-07-02 ENCOUNTER — Ambulatory Visit (HOSPITAL_COMMUNITY)
Admission: EM | Admit: 2019-07-02 | Discharge: 2019-07-02 | Disposition: A | Discharge status: Home / Self Care | Payer: Medicaid Other | Attending: Family Medicine | Admitting: Family Medicine

## 2019-07-02 ENCOUNTER — Encounter (HOSPITAL_COMMUNITY): Payer: Self-pay

## 2019-07-02 ENCOUNTER — Other Ambulatory Visit: Payer: Self-pay

## 2019-07-02 DIAGNOSIS — R0981 Nasal congestion: Secondary | ICD-10-CM | POA: Insufficient documentation

## 2019-07-02 DIAGNOSIS — Z20822 Contact with and (suspected) exposure to covid-19: Secondary | ICD-10-CM | POA: Insufficient documentation

## 2019-07-02 DIAGNOSIS — I10 Essential (primary) hypertension: Secondary | ICD-10-CM | POA: Insufficient documentation

## 2019-07-02 DIAGNOSIS — E7800 Pure hypercholesterolemia, unspecified: Secondary | ICD-10-CM | POA: Insufficient documentation

## 2019-07-02 DIAGNOSIS — R0982 Postnasal drip: Secondary | ICD-10-CM | POA: Insufficient documentation

## 2019-07-02 DIAGNOSIS — R232 Flushing: Secondary | ICD-10-CM | POA: Diagnosis not present

## 2019-07-02 DIAGNOSIS — J45909 Unspecified asthma, uncomplicated: Secondary | ICD-10-CM | POA: Diagnosis not present

## 2019-07-02 DIAGNOSIS — E669 Obesity, unspecified: Secondary | ICD-10-CM | POA: Diagnosis not present

## 2019-07-02 DIAGNOSIS — M1990 Unspecified osteoarthritis, unspecified site: Secondary | ICD-10-CM | POA: Insufficient documentation

## 2019-07-02 DIAGNOSIS — R059 Cough, unspecified: Secondary | ICD-10-CM

## 2019-07-02 DIAGNOSIS — R05 Cough: Secondary | ICD-10-CM | POA: Insufficient documentation

## 2019-07-02 DIAGNOSIS — R062 Wheezing: Secondary | ICD-10-CM

## 2019-07-02 MED ORDER — PREDNISONE 10 MG (21) PO TBPK: ORAL_TABLET | Freq: Every day | ORAL | 0 refills | Status: DC

## 2019-07-02 MED ORDER — HYDROCODONE-HOMATROPINE 5-1.5 MG/5ML PO SYRP: 5.0000 mL | ORAL_SOLUTION | Freq: Four times a day (QID) | ORAL | 0 refills | Status: DC | PRN

## 2019-07-02 NOTE — ED Triage Notes (Signed)
Pt c/o cough,worse at night, nasal congestion "hot flashes" x 1week, some chills; hasn't taken temperature. Also c/o mild SOB, worse on exertion/coughing, some diarrhea x3 days. Been taking honey and nyquil for sx.  Denies abd pain, n/v, HA or sore throat or loss of taste. Denies dysuria sx.  States recurrent h/o congestion with difficulty smelling.

## 2019-07-02 NOTE — Discharge Instructions (Addendum)
Be aware, the cough medication prescribed may cause drowsiness. Please do not drive, operate heavy machinery or make important decisions while on this medication, it can cloud your judgement.  You have been tested for COVID-19 today. If your test is positive, you will receive a phone call from [facility] regarding your results. Negative test results are not called. Both positive and negative results area always visible on MyChart. If you do not have a MyChart account, sign up instructions are in your discharge papers.

## 2019-07-03 NOTE — ED Provider Notes (Signed)
[place]   [id number] 07/02/19 Arrival Time: [id number]  ASSESSMENT & PLAN:  1. Cough   2. Wheezing     COVID testing sent. See work note for self-isolation guidelines. No indication for chest imaging at this time.  Begin: Meds ordered this encounter  Medications  . HYDROcodone-homatropine (HYCODAN) 5-1.5 MG/5ML syrup    Sig: Take 5 mLs by mouth every 6 (six) hours as needed for cough.    Dispense:  90 mL    Refill:  0  . predniSONE (STERAPRED UNI-PAK 21 TAB) 10 MG (21) TBPK tablet    Sig: Take by mouth daily. Take as directed.    Dispense:  21 tablet    Refill:  0    Cough medication sedation precautions. Discussed typical duration of symptoms. OTC symptom care as needed. Ensure adequate fluid intake and rest. May f/u with PCP or here as needed.  Reviewed expectations re: course of current medical issues. Questions answered. Outlined signs and symptoms indicating need for more acute intervention. Patient verbalized understanding. After Visit Summary given.   SUBJECTIVE: History from: patient.  Heather Perry is a 54 y.o. female who presents with complaint of nasal congestion, post-nasal drainage, and a persistent dry cough; also with "hot flashes". Onset abrupt, over the past week; with mild fatigue and without body aches. SOB: none. Wheezing: questions, esp with cough. Fever: believed to be present, temp not taken. Overall normal PO intake without n/v. Known sick contacts or COVID-19 exposure: no. No specific or significant aggravating or alleviating factors reported. OTC treatment: none reported.  Social History   Tobacco Use  Smoking Status Never Smoker  Smokeless Tobacco Never Used    ROS: As per HPI.   OBJECTIVE:  Vitals:   07/02/19 1501  BP: 140/84  Pulse: 100  Resp: 20  Temp: 97.6 F (36.4 C)  TempSrc: Oral  SpO2: 98%     General appearance: alert; appears fatigued HEENT: nasal congestion; clear runny nose; throat irritation  secondary to post-nasal drainage Neck: supple without LAD CV: RRR Lungs: unlabored respirations, symmetrical air entry with mild bilateral expiratory wheezing; cough: moderate, dry Abd: soft Ext: no LE edema Skin: warm and dry Psychological: alert and cooperative; normal mood and affect   Allergies  Allergen Reactions  . Lortab [Hydrocodone-Acetaminophen]     Started itching  . Penicillins Itching    Past Medical History:  Diagnosis Date  . Allergy   . Anxiety   . Arthritis   . Asthma   . Blood transfusion without reported diagnosis   . Depression   . GERD (gastroesophageal reflux disease)   . High cholesterol   . Hypertension   . Obesity   . Osteoporosis   . Recurrent boils   . Seizures ([place])    last seizure "3 years ago" per pt 06-16-16 KBW  . Sleep apnea    no CPAP used  . Uterine fibroid    Family History  Problem Relation Age of Onset  . Breast cancer Maternal Aunt        unsure how old of onset  . Breast cancer Maternal Aunt        unsure how old of onset  . Colon cancer Neg Hx   . Esophageal cancer Neg Hx   . Rectal cancer Neg Hx   . Stomach cancer Neg Hx    Social History   Socioeconomic History  . Marital status: Single    Spouse name: Not on file  . Number of children: Not  on file  . Years of education: Not on file  . Highest education level: Not on file  Occupational History  . Not on file  Tobacco Use  . Smoking status: Never Smoker  . Smokeless tobacco: Never Used  Substance and Sexual Activity  . Alcohol use: No  . Drug use: No    Types: "Crack" cocaine    Comment: last smoked 2011  . Sexual activity: Not on file  Other Topics Concern  . Not on file  Social History Narrative  . Not on file   Social Determinants of Health   Financial Resource Strain:   . Difficulty of Paying Living Expenses: Not on file  Food Insecurity:   . Worried About Charity fundraiser in the Last Year: Not on file  . Ran Out of Food in the Last Year:  Not on file  Transportation Needs:   . Lack of Transportation (Medical): Not on file  . Lack of Transportation (Non-Medical): Not on file  Physical Activity:   . Days of Exercise per Week: Not on file  . Minutes of Exercise per Session: Not on file  Stress:   . Feeling of Stress : Not on file  Social Connections:   . Frequency of Communication with Friends and Family: Not on file  . Frequency of Social Gatherings with Friends and Family: Not on file  . Attends Religious Services: Not on file  . Active Member of Clubs or Organizations: Not on file  . Attends Archivist Meetings: Not on file  . Marital Status: Not on file  Intimate Partner Violence:   . Fear of Current or Ex-Partner: Not on file  . Emotionally Abused: Not on file  . Physically Abused: Not on file  . Sexually Abused: Not on file           Vanessa Kick, MD 07/03/19 [phone]

## 2019-07-04 ENCOUNTER — Encounter (HOSPITAL_COMMUNITY): Payer: Self-pay

## 2019-07-04 LAB — NOVEL CORONAVIRUS, NAA (HOSP ORDER, SEND-OUT TO REF LAB; TAT 18-24 HRS): SARS-CoV-2, NAA: NOT DETECTED

## 2019-07-16 ENCOUNTER — Ambulatory Visit: Payer: Self-pay | Admitting: *Deleted

## 2019-07-16 NOTE — Telephone Encounter (Signed)
Pt called with complaints of right arm pt to her right breast; it started 3 days ago; she has applied heat and pain medication; the pt says she thinks it is a pulled muscle; she rates her pain at 6 out of 10; her pain is constant; the pt says it hurts with movement, or if she coughs; the pt says she had been walking on her treadmill and was moving her arms up and down; recommendations made per nurse triage protocol; she verbalized understanding  the pt states she will go to UC or see if another provider in the office can see her.  Reason for Disposition . [1] Arm pains with exertion (e.g., walking) AND [2] pain goes away on resting AND [3] not present now  Answer Assessment - Initial Assessment Questions 1. ONSET: "When did the pain start?"     1/23/201 2. LOCATION: "Where is the pain located?"     Right armpit to right breast 3. PAIN: "How bad is the pain?" (Scale 1-10; or mild, moderate, severe)   - MILD (1-3): doesn't interfere with normal activities   - MODERATE (4-7): interferes with normal activities (e.g., work or school) or awakens from sleep   - SEVERE (8-10): excruciating pain, unable to do any normal activities, unable to hold a cup of water     6 out of 10 4. WORK OR EXERCISE: "Has there been any recent work or exercise that involved this part of the body?"     Yes, moving arms up and down while walking on treadmill 5. CAUSE: "What do you think is causing the arm pain?"     Pulled muscle 6. OTHER SYMPTOMS: "Do you have any other symptoms?" (e.g., neck pain, swelling, rash, fever, numbness, weakness)      7. PREGNANCY: "Is there any chance you are pregnant?" "When was your last menstrual period?"  Protocols used: ARM PAIN-A-AH

## 2019-07-17 ENCOUNTER — Ambulatory Visit (HOSPITAL_COMMUNITY)
Admission: EM | Admit: 2019-07-17 | Discharge: 2019-07-17 | Disposition: A | Discharge status: Home / Self Care | Payer: Medicaid Other | Attending: Family Medicine | Admitting: Family Medicine

## 2019-07-17 ENCOUNTER — Other Ambulatory Visit: Payer: Self-pay

## 2019-07-17 ENCOUNTER — Encounter (HOSPITAL_COMMUNITY): Payer: Self-pay | Admitting: Emergency Medicine

## 2019-07-17 ENCOUNTER — Ambulatory Visit (INDEPENDENT_AMBULATORY_CARE_PROVIDER_SITE_OTHER): Payer: Medicaid Other

## 2019-07-17 DIAGNOSIS — R079 Chest pain, unspecified: Secondary | ICD-10-CM

## 2019-07-17 NOTE — Discharge Instructions (Signed)
You have been seen at the [facility] today for chest pain. Your evaluation today was not suggestive of any emergent condition requiring medical intervention at this time. Your ECG (heart tracing) and chest x-ray did not show any worrisome changes. However, some medical problems make take more time to appear. Therefore, it's very important that you pay attention to any new symptoms or worsening of your current condition.  Please proceed directly to the Emergency Department immediately should you feel worse in any way or have any of the following symptoms: increasing or different chest pain, pain that spreads to your arm, neck, jaw, back or abdomen, shortness of breath, or nausea and vomiting.  

## 2019-07-17 NOTE — ED Provider Notes (Signed)
[place]   [id number] 07/17/19 Arrival Time: 1218  ASSESSMENT & PLAN:  1. Chest pain, unspecified type      I have personally viewed the imaging studies ordered this visit. CXR: normal; no pneumothorax.  ECG: Performed today and interpreted by me: sinus tachycardia; no STEMI. 08/02/2017 ECG comparison without significant changes appreciated.  Low suspicion of cardia-related pain. Discussed.   Discharge Instructions     You have been seen at the [facility] today for chest pain. Your evaluation today was not suggestive of any emergent condition requiring medical intervention at this time. Your ECG (heart tracing) and chest x-ray did not show any worrisome changes. However, some medical problems make take more time to appear. Therefore, it's very important that you pay attention to any new symptoms or worsening of your current condition.  Please proceed directly to the Emergency Department immediately should you feel worse in any way or have any of the following symptoms: increasing or different chest pain, pain that spreads to your arm, neck, jaw, back or abdomen, shortness of breath, or nausea and vomiting.      Chest pain precautions given. Reviewed expectations re: course of current medical issues. Questions answered. Outlined signs and symptoms indicating need for more acute intervention. Patient verbalized understanding. After Visit Summary given.   SUBJECTIVE:  History from: patient. Katriana Kraft is a 54 y.o. female who presents with complaint of intermittent R sided chest discomfort described as dull that occasionally radiates to R shoulder. First noted approx 3 d ago. Reports described symptoms are unchanged since beginning. Questions worse with certain movements such as pushing up to get out of bed. No change in symptoms with exertion. Has been coughing off/on for the past week or two. No specific SOB reported. Regular hot flashes but  attributes these to going through menopause. Injury or recent strenuous activity: none. No associated n/v. Typical duration of symptoms when present: few seconds to minute then resolves. Denies: fatigue, irregular heart beat, lower extremity edema, near-syncope, orthopnea, palpitations, paroxysmal nocturnal dyspnea and syncope. Discomfort does not wake her at night. Afebrile. Ambulatory without assistance. Self/OTC treatment: ibuprofen; helps for a few hours. History of similar: no. Illicit drug use: none.  Social History   Tobacco Use  Smoking Status Never Smoker  Smokeless Tobacco Never Used   Social History   Substance and Sexual Activity  Alcohol Use No    ROS: As per HPI. All other systems negative.   OBJECTIVE:  Vitals:   07/17/19 1255  BP: 139/84  Pulse: 74  Resp: 16  Temp: 97.9 F (36.6 C)  TempSrc: Oral  SpO2: 99%    General appearance: alert, oriented, no acute distress Eyes: PERRLA; EOMI; conjunctivae normal HENT: normocephalic; atraumatic Neck: supple with FROM Lungs: without labored respirations; CTAB Heart: regular rate and rhythm without murmer Chest Wall: with tenderness to palpation over R upper chest Abdomen: soft, non-tender; obese Extremities: without edema; without calf swelling or tenderness; symmetrical without gross deformities Skin: warm and dry; without rash or lesions Psychological: alert and cooperative; normal mood and affect   Imaging: DG Chest 2 View  Result Date: 07/17/2019 CLINICAL DATA:  Right-sided chest pain and cough for the past 3 days. EXAM: CHEST - 2 VIEW COMPARISON:  Chest x-ray dated May 06, 2018. FINDINGS: The heart size and mediastinal contours are within normal limits. Both lungs are clear. The visualized skeletal structures are unremarkable. IMPRESSION: No active cardiopulmonary disease. Electronically Signed   By: Titus Dubin  M.D.   On: 07/17/2019 13:55     Allergies  Allergen Reactions  . Lortab  [Hydrocodone-Acetaminophen]     Started itching  . Penicillins Itching    Past Medical History:  Diagnosis Date  . Allergy   . Anxiety   . Arthritis   . Asthma   . Blood transfusion without reported diagnosis   . Depression   . GERD (gastroesophageal reflux disease)   . High cholesterol   . Hypertension   . Obesity   . Osteoporosis   . Recurrent boils   . Seizures ([place])    last seizure "3 years ago" per pt 06-16-16 KBW  . Sleep apnea    no CPAP used  . Uterine fibroid    Social History   Socioeconomic History  . Marital status: Single    Spouse name: Not on file  . Number of children: Not on file  . Years of education: Not on file  . Highest education level: Not on file  Occupational History  . Not on file  Tobacco Use  . Smoking status: Never Smoker  . Smokeless tobacco: Never Used  Substance and Sexual Activity  . Alcohol use: No  . Drug use: No    Types: "Crack" cocaine    Comment: last smoked 2011  . Sexual activity: Not on file  Other Topics Concern  . Not on file  Social History Narrative  . Not on file   Social Determinants of Health   Financial Resource Strain:   . Difficulty of Paying Living Expenses: Not on file  Food Insecurity:   . Worried About Charity fundraiser in the Last Year: Not on file  . Ran Out of Food in the Last Year: Not on file  Transportation Needs:   . Lack of Transportation (Medical): Not on file  . Lack of Transportation (Non-Medical): Not on file  Physical Activity:   . Days of Exercise per Week: Not on file  . Minutes of Exercise per Session: Not on file  Stress:   . Feeling of Stress : Not on file  Social Connections:   . Frequency of Communication with Friends and Family: Not on file  . Frequency of Social Gatherings with Friends and Family: Not on file  . Attends Religious Services: Not on file  . Active Member of Clubs or Organizations: Not on file  . Attends Archivist Meetings: Not on file  .  Marital Status: Not on file  Intimate Partner Violence:   . Fear of Current or Ex-Partner: Not on file  . Emotionally Abused: Not on file  . Physically Abused: Not on file  . Sexually Abused: Not on file   Family History  Problem Relation Age of Onset  . Breast cancer Maternal Aunt        unsure how old of onset  . Breast cancer Maternal Aunt        unsure how old of onset  . Colon cancer Neg Hx   . Esophageal cancer Neg Hx   . Rectal cancer Neg Hx   . Stomach cancer Neg Hx    Past Surgical History:  Procedure Laterality Date  . ABDOMINAL HYSTERECTOMY    . CESAREAN SECTION    . KNEE SURGERY Right    x2  . UPPER GASTROINTESTINAL ENDOSCOPY       Vanessa Kick, MD 07/20/19 [phone]

## 2019-07-17 NOTE — ED Triage Notes (Signed)
PT reports right breast pain for 3 days, pain now radiates into right shoulder, new pain in central chest. Spoke to PCP on the phone and they asked her to come here for EKG

## 2019-07-29 IMAGING — CT CT HEAD W/O CM
4 series · 16 of 47 positions shown, 18 images · non-contrast
Comparison: None.

CLINICAL DATA: Right arm weakness.

EXAM:
CT HEAD WITHOUT CONTRAST
TECHNIQUE: Contiguous axial images were obtained from the base of the skull
through the vertex without intravenous contrast.

[Series 3: head without · axial · non-contrast · 0.48mm/px · z∈[-144,-24]mm · 7 of 33 slices shown, 9 images]
[im 5/33  brain]
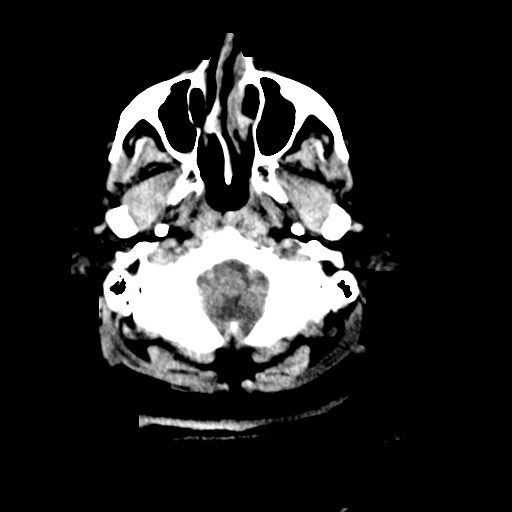
[im 5/33  bone]
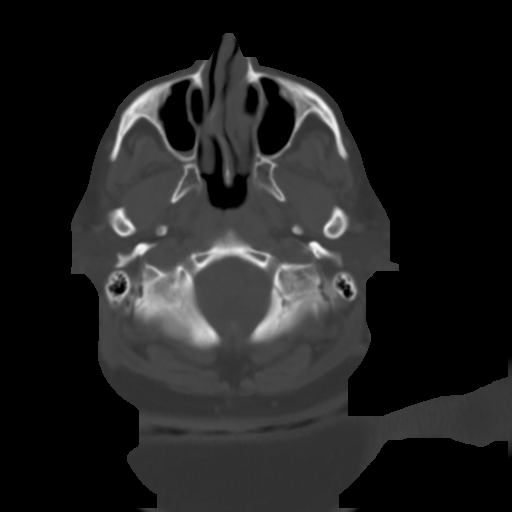
[im 9/33  brain]
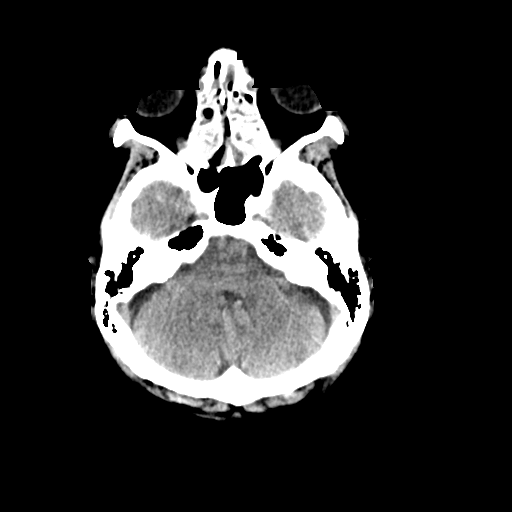
[im 13/33  brain]
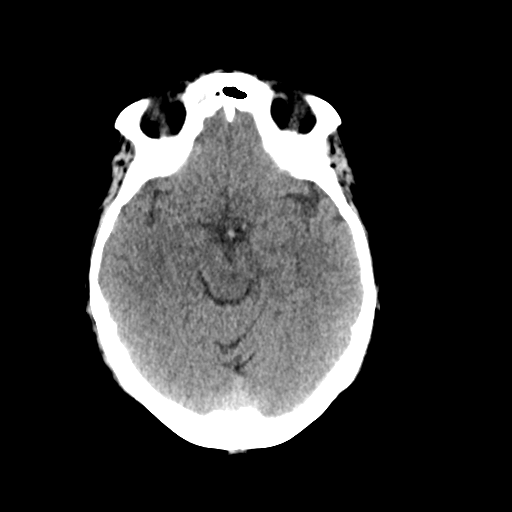
[im 17/33  brain]
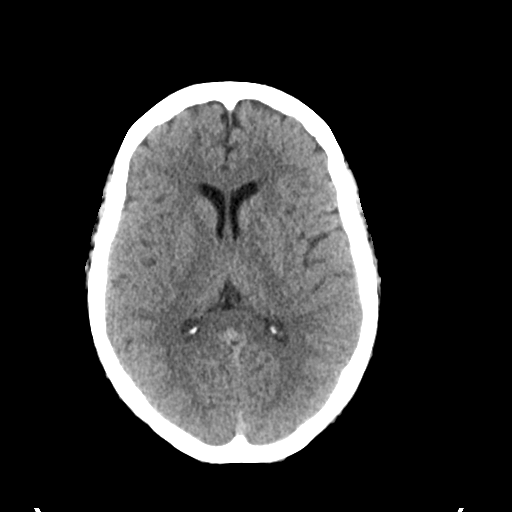
[im 21/33  brain]
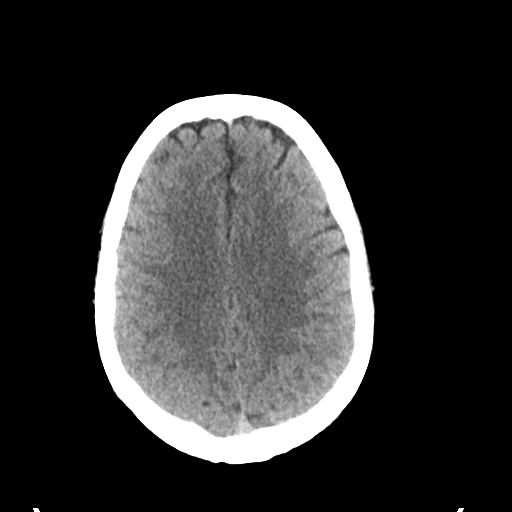
[im 21/33  bone]
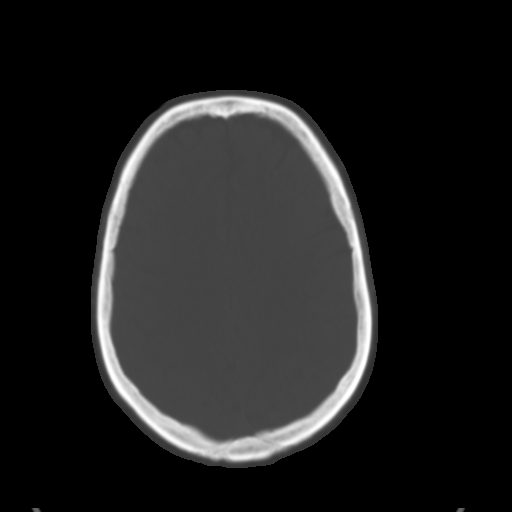
[im 25/33  brain]
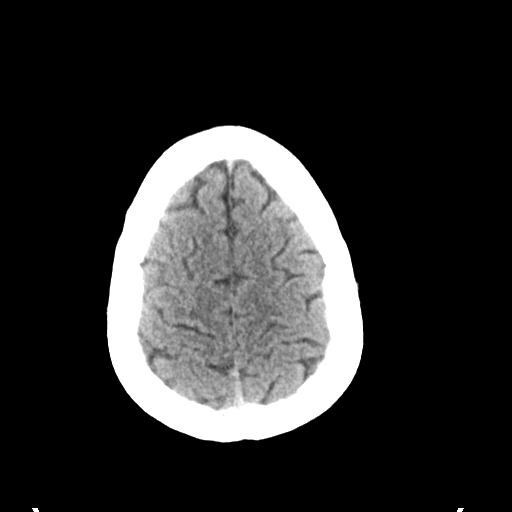
[im 29/33  brain]
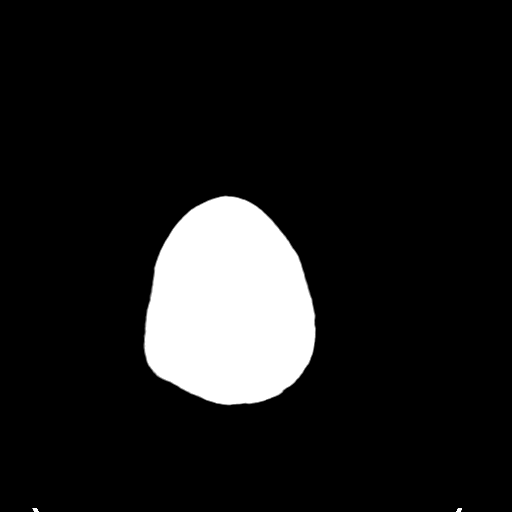

[Series 4: head bone · axial · 0.48mm/px · z∈[-148,-116]mm · 3 of 81 slices shown]
[im 9/81  bone]
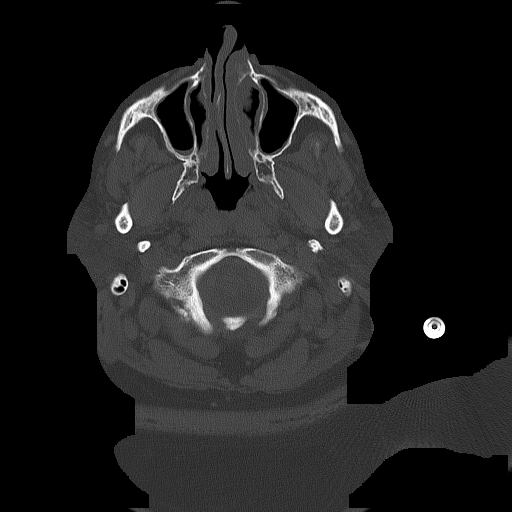
[im 17/81  bone]
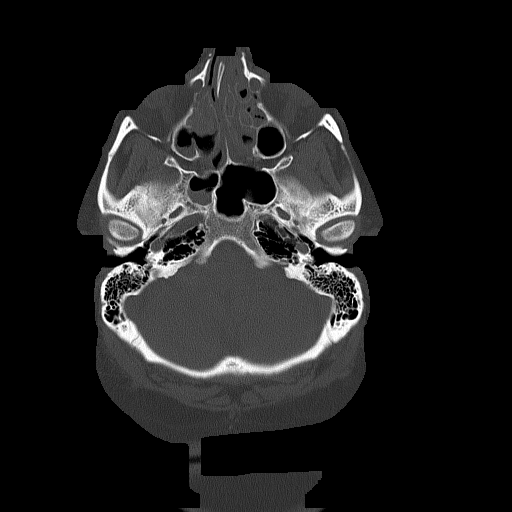
[im 25/81  bone]
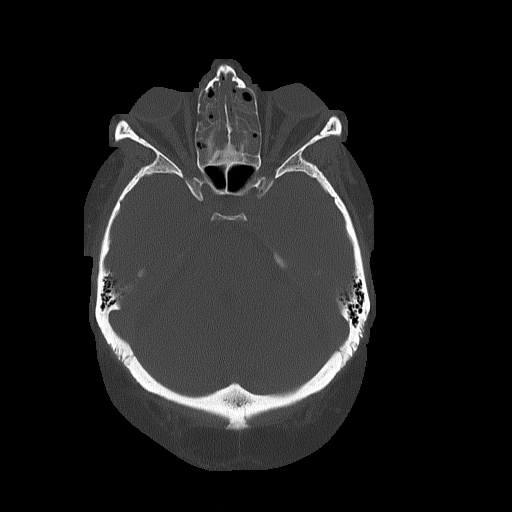

[Series 5: head without cor · coronal · non-contrast · 0.32mm/px · 3 of 73 slices shown]
[im 25/73  brain]
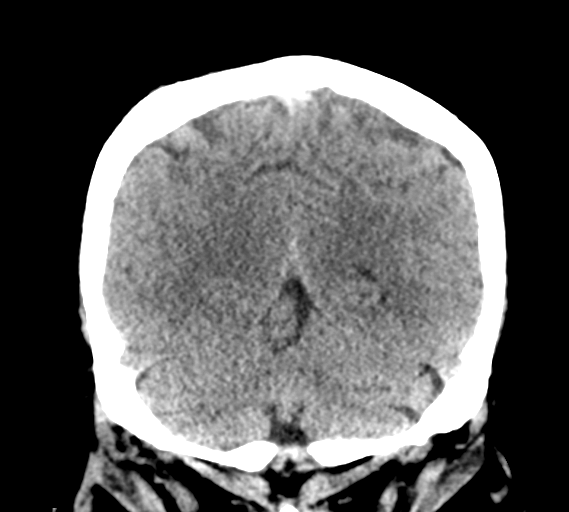
[im 33/73  brain]
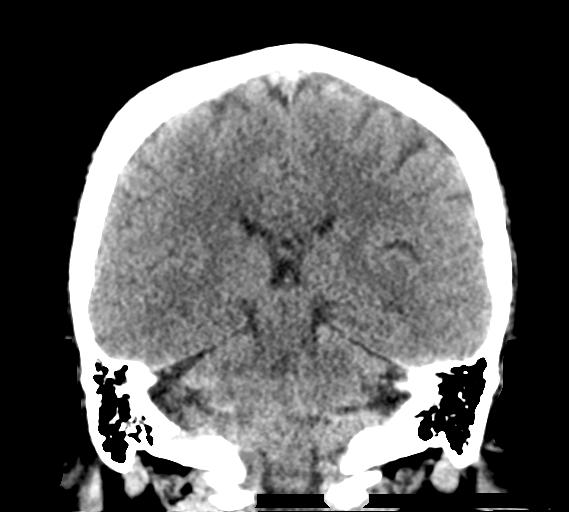
[im 41/73  brain]
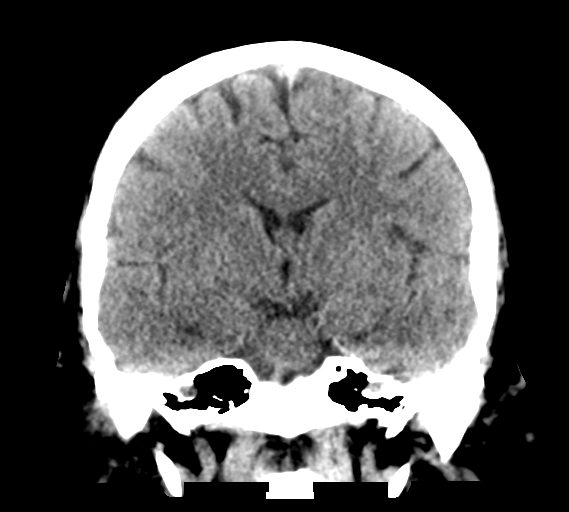

[Series 6: head without sag · sagittal · non-contrast · 0.32mm/px · 3 of 61 slices shown]
[im 21/61  brain]
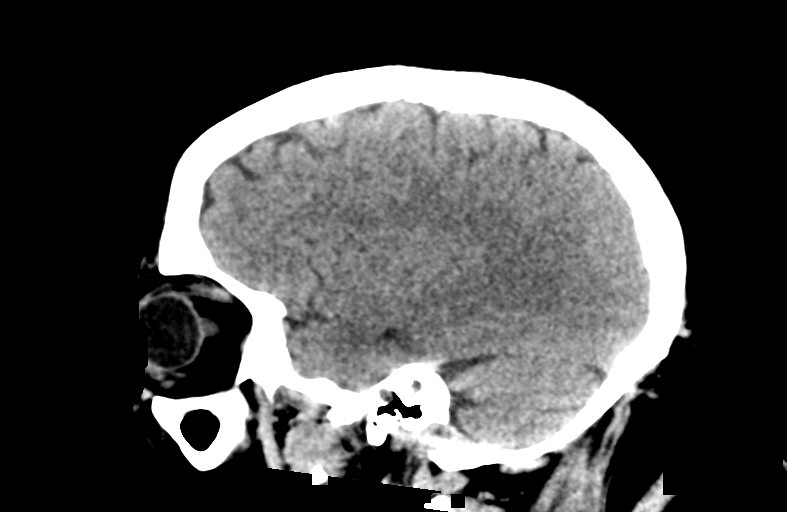
[im 31/61  brain]
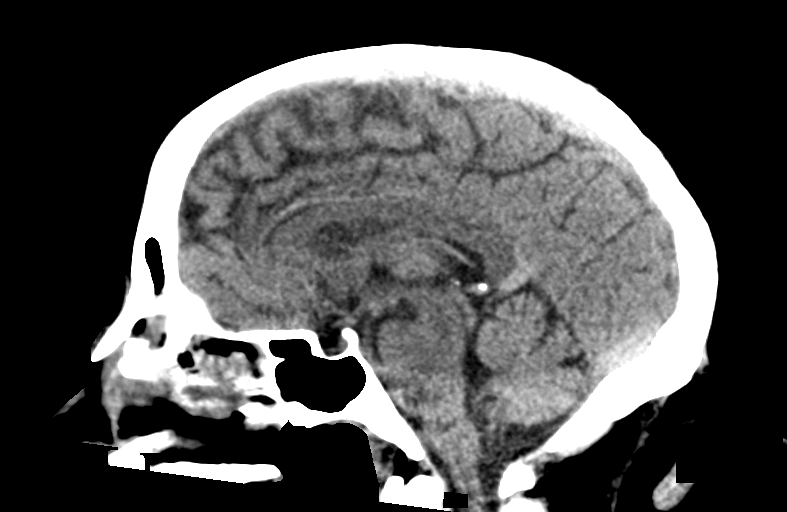
[im 41/61  brain]
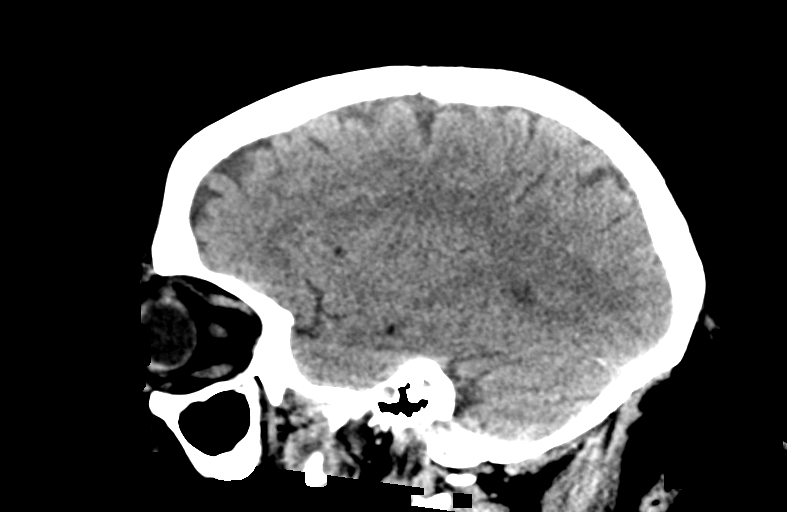

[16 of 47 positions shown; findings below may reference images not displayed]

FINDINGS: Brain: There is no evidence of acute infarct, intracranial
hemorrhage, mass, midline shift, or extra-axial fluid collection.
The ventricles and sulci are normal.

Vascular: No hyperdense vessel.

Skull: No fracture or focal osseous lesion.

Sinuses/Orbits: Extensive, near complete opacification of the
frontal and ethmoid sinuses bilaterally. Milder mucosal thickening
in the maxillary and sphenoid sinuses. Small right maxillary and
right sphenoid sinus fluid levels. Clear mastoid air cells.
Unremarkable orbits.

Other: None.
IMPRESSION: 1. No evidence of acute intracranial abnormality. Unremarkable CT
appearance of the brain.
2. Extensive sinusitis.

## 2019-08-01 ENCOUNTER — Other Ambulatory Visit: Payer: Self-pay | Admitting: Family Medicine

## 2019-08-01 DIAGNOSIS — N644 Mastodynia: Secondary | ICD-10-CM

## 2019-08-13 ENCOUNTER — Ambulatory Visit: Payer: Medicaid Other | Admitting: Pulmonary Disease

## 2019-08-16 ENCOUNTER — Ambulatory Visit
Admission: RE | Admit: 2019-08-16 | Discharge: 2019-08-16 | Disposition: A | Discharge status: Home / Self Care | Payer: Medicaid Other | Source: Ambulatory Visit | Attending: Family Medicine | Admitting: Family Medicine

## 2019-08-16 ENCOUNTER — Other Ambulatory Visit: Payer: Self-pay

## 2019-08-16 ENCOUNTER — Ambulatory Visit: Payer: Medicaid Other

## 2019-08-16 DIAGNOSIS — N644 Mastodynia: Secondary | ICD-10-CM

## 2019-08-27 ENCOUNTER — Other Ambulatory Visit: Payer: Medicaid Other

## 2019-09-02 ENCOUNTER — Other Ambulatory Visit: Payer: Self-pay

## 2019-09-02 ENCOUNTER — Ambulatory Visit (INDEPENDENT_AMBULATORY_CARE_PROVIDER_SITE_OTHER): Payer: Medicaid Other

## 2019-09-02 ENCOUNTER — Ambulatory Visit (INDEPENDENT_AMBULATORY_CARE_PROVIDER_SITE_OTHER): Payer: Medicaid Other | Admitting: Orthopedic Surgery

## 2019-09-02 ENCOUNTER — Encounter: Payer: Self-pay | Admitting: Orthopedic Surgery

## 2019-09-02 DIAGNOSIS — M7541 Impingement syndrome of right shoulder: Secondary | ICD-10-CM

## 2019-09-02 DIAGNOSIS — M25511 Pain in right shoulder: Secondary | ICD-10-CM | POA: Diagnosis not present

## 2019-09-02 MED ORDER — METHYLPREDNISOLONE ACETATE 40 MG/ML IJ SUSP
40.0000 mg | INTRAMUSCULAR | Status: AC | PRN
  Administered 2019-09-02: 40 mg via INTRA_ARTICULAR

## 2019-09-02 MED ORDER — LIDOCAINE HCL 1 % IJ SOLN
5.0000 mL | INTRAMUSCULAR | Status: AC | PRN
  Administered 2019-09-02: 5 mL

## 2019-09-02 MED ORDER — BUPIVACAINE HCL 0.5 % IJ SOLN
9.0000 mL | INTRAMUSCULAR | Status: AC | PRN
  Administered 2019-09-02: 9 mL via INTRA_ARTICULAR

## 2019-09-02 NOTE — Progress Notes (Signed)
Office Visit Note   [name]           Date of Birth: 12-04-1965           MRN: [id number] Visit Date: 09/02/2019 Requested by: Helane Rima, MD [address] [place],  [place] [id number] PCP: Helane Rima, MD  Subjective: Chief Complaint  Patient presents with  . Right Shoulder - Pain    HPI: Heather Perry is a 54 year old patient with 63-month history of atraumatic onset right arm and shoulder pain.  She states that 5 years ago she had a shoulder dislocation.  She has had cortisone injection in the past which has helped.  She describes pain in the shoulder girdle region but no neck pain or radicular symptoms.  She takes Motrin which may help some.  She is not working.  She has autoimmune disorder and takes hydroxychloroquine.              ROS: All systems reviewed are negative as they relate to the chief complaint within the history of present illness.  Patient denies  fevers or chills.   Assessment & Plan: Visit Diagnoses:  1. Right shoulder pain, unspecified chronicity     Plan: Impression is right shoulder pain with pretty reasonable rotator cuff strength and no restriction of passive range of motion.  No discrete AC joint tenderness.  I think she may have some bursitis.  Subacromial injection performed today.  6-week return to decide for or against MRI scanning at that time.  May have labral pathology as well.  Follow-Up Instructions: Return in about 6 weeks (around 10/14/2019).   Orders:  Orders Placed This Encounter  Procedures  . XR Shoulder Right   No orders of the defined types were placed in this encounter.     Procedures: Large Joint Inj: R subacromial bursa on 09/02/2019 11:41 PM Indications: diagnostic evaluation and pain Details: 18 G 1.5 in needle, posterior approach  Arthrogram: No  Medications: 9 mL bupivacaine 0.5 %; 40 mg methylPREDNISolone acetate 40 MG/ML; 5 mL lidocaine 1 % Outcome: tolerated well, no immediate  complications Procedure, treatment alternatives, risks and benefits explained, specific risks discussed. Consent was given by the patient. Immediately prior to procedure a time out was called to verify the correct patient, procedure, equipment, support staff and site/side marked as required. Patient was prepped and draped in the usual sterile fashion.       Clinical Data: No additional findings.  Objective: Vital Signs: There were no vitals taken for this visit.  Physical Exam:   Constitutional: Patient appears well-developed HEENT:  Head: Normocephalic Eyes:EOM are normal Neck: Normal range of motion Cardiovascular: Normal rate Pulmonary/chest: Effort normal Neurologic: Patient is alert Skin: Skin is warm Psychiatric: Patient has normal mood and affect    Ortho Exam: Ortho exam demonstrates good cervical spine range of motion.  Patient has reasonable passive range of motion with no restriction of external rotation 15 degrees of abduction.  Radial pulse intact bilaterally.  No masses lymphadenopathy or skin changes noted in that shoulder girdle region.  She has no discrete AC joint tenderness to direct palpation.  Specialty Comments:  No specialty comments available.  Imaging: XR Shoulder Right  Result Date: 09/02/2019 AP outlet axillary right shoulder reviewed.  Glenohumeral joint is reduced.  Mild AC joint degenerative changes noted.  No glenohumeral articular surface degenerative changes.  Acromiohumeral distance maintained.  No fracture.  No dislocation.    PMFS History: Patient Active Problem List  Diagnosis Date Noted  . Anxiety disorder 06/28/2017  . MDD (major depressive disorder), recurrent episode, mild ([place]) 06/28/2017  . Essential hypertension 06/26/2017  . Hyperlipidemia with target LDL less than 100 06/26/2017  . Chest pain with low risk for cardiac etiology 06/26/2017  . 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 Dermatosis papulosa nigra  04/13/2017  . Rash and nonspecific skin eruption 04/13/2017   Past Medical History:  Diagnosis Date  . Allergy   . Anxiety   . Arthritis   . Asthma   . Blood transfusion without reported diagnosis   . Depression   . GERD (gastroesophageal reflux disease)   . High cholesterol   . Hypertension   . Obesity   . Osteoporosis   . Recurrent boils   . Seizures ([place])    last seizure "3 years ago" per pt 06-16-16 KBW  . Sleep apnea    no CPAP used  . Uterine fibroid     Family History  Problem Relation Age of Onset  . Breast cancer Maternal Aunt        unsure how old of onset  . Breast cancer Maternal Aunt        unsure how old of onset  . Colon cancer Neg Hx   . Esophageal cancer Neg Hx   . Rectal cancer Neg Hx   . Stomach cancer Neg Hx     Past Surgical History:  Procedure Laterality Date  . ABDOMINAL HYSTERECTOMY    . CESAREAN SECTION    . KNEE SURGERY Right    x2  . UPPER GASTROINTESTINAL ENDOSCOPY     Social History   Occupational History  . Not on file  Tobacco Use  . Smoking status: Never Smoker  . Smokeless tobacco: Never Used  Substance and Sexual Activity  . Alcohol use: No  . Drug use: No    Types: "Crack" cocaine    Comment: last smoked 2011  . Sexual activity: Not on file

## 2019-09-10 ENCOUNTER — Ambulatory Visit: Payer: Medicaid Other | Admitting: Orthopaedic Surgery

## 2019-10-14 ENCOUNTER — Ambulatory Visit: Payer: Medicaid Other | Admitting: Orthopedic Surgery

## 2019-10-16 ENCOUNTER — Ambulatory Visit: Payer: Medicaid Other | Admitting: Orthopedic Surgery

## 2019-12-28 ENCOUNTER — Encounter (HOSPITAL_COMMUNITY): Payer: Self-pay

## 2019-12-28 ENCOUNTER — Emergency Department (HOSPITAL_COMMUNITY)
Admission: EM | Admit: 2019-12-28 | Discharge: 2019-12-28 | Disposition: A | Discharge status: Home / Self Care | Payer: Medicaid Other | Attending: Emergency Medicine | Admitting: Emergency Medicine

## 2019-12-28 ENCOUNTER — Ambulatory Visit (HOSPITAL_COMMUNITY)
Admission: EM | Admit: 2019-12-28 | Discharge: 2019-12-28 | Disposition: A | Discharge status: Home / Self Care | Payer: Medicaid Other

## 2019-12-28 ENCOUNTER — Other Ambulatory Visit: Payer: Self-pay

## 2019-12-28 DIAGNOSIS — Z79899 Other long term (current) drug therapy: Secondary | ICD-10-CM | POA: Insufficient documentation

## 2019-12-28 DIAGNOSIS — R05 Cough: Secondary | ICD-10-CM | POA: Insufficient documentation

## 2019-12-28 DIAGNOSIS — G8929 Other chronic pain: Secondary | ICD-10-CM | POA: Diagnosis present

## 2019-12-28 DIAGNOSIS — R059 Cough, unspecified: Secondary | ICD-10-CM

## 2019-12-28 DIAGNOSIS — I10 Essential (primary) hypertension: Secondary | ICD-10-CM | POA: Diagnosis not present

## 2019-12-28 DIAGNOSIS — M7918 Myalgia, other site: Secondary | ICD-10-CM | POA: Diagnosis not present

## 2019-12-28 MED ORDER — BENZONATATE 100 MG PO CAPS: 100.0000 mg | ORAL_CAPSULE | Freq: Three times a day (TID) | ORAL | 0 refills | Status: DC | PRN

## 2019-12-28 MED ORDER — OXYCODONE-ACETAMINOPHEN 5-325 MG PO TABS
1.0000 | ORAL_TABLET | Freq: Once | ORAL | Status: AC
  Administered 2019-12-28: 1 via ORAL
  Filled 2019-12-28: qty 1

## 2019-12-28 NOTE — ED Notes (Addendum)
Patient is agitated and verbally aggressive toward EDP, saying she wanted prescription for "[place]." -Patient snatched discharged paper from this RN and stormed off angrily out of the ED

## 2019-12-28 NOTE — ED Triage Notes (Signed)
Patient complains of chronic pain that she states is related to a pinched nerve in back and neck. Has been going to chronic pain specialist and they closed permanent 6/1. Patient requesting pain meds

## 2019-12-28 NOTE — ED Provider Notes (Signed)
[facility] EMERGENCY DEPARTMENT Provider Note   CSN: [id number] Arrival date & time: 12/28/19  1224     History No chief complaint on file.   Blaire Palomino is a 54 y.o. female.  HPI Pt is a 54 y/o female with a medical history as noted below. Pt states that she has a history of chronic pain in her right shoulder, neck, and left hip. She denies any recent falls or injury but states her pain was managed at a pain clinic that recently shut down. Her current pain is 10/10. She denies numbness, CP, SOB, bowel or bladder incontinence, nausea, vomiting.  She additionally notes congestion and a mild cough for the past one day. Her grandson is sick with similar sx. She is not a smoker.      Past Medical History:  Diagnosis Date  . Allergy   . Anxiety   . Arthritis   . Asthma   . Blood transfusion without reported diagnosis   . Depression   . GERD (gastroesophageal reflux disease)   . High cholesterol   . Hypertension   . Obesity   . Osteoporosis   . Recurrent boils   . Seizures ([place])    last seizure "3 years ago" per pt 06-16-16 KBW  . Sleep apnea    no CPAP used  . Uterine fibroid     Patient Active Problem List   Diagnosis Date Noted  . Anxiety disorder 06/28/2017  . MDD (major depressive disorder), recurrent episode, mild ([place]) 06/28/2017  . Essential hypertension 06/26/2017  . Hyperlipidemia with target LDL less than 100 06/26/2017  . Chest pain with low risk for cardiac etiology 06/26/2017  . 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 Rash and nonspecific skin eruption 04/13/2017    Past Surgical History:  Procedure Laterality Date  . ABDOMINAL HYSTERECTOMY    . CESAREAN SECTION    . KNEE SURGERY Right    x2  . UPPER GASTROINTESTINAL ENDOSCOPY       OB History   No obstetric history on file.     Family History  Problem Relation Age of Onset  . Breast cancer Maternal Aunt        unsure how  old of onset  . Breast cancer Maternal Aunt        unsure how old of onset  . Colon cancer Neg Hx   . Esophageal cancer Neg Hx   . Rectal cancer Neg Hx   . Stomach cancer Neg Hx     Social History   Tobacco Use  . Smoking status: Never Smoker  . Smokeless tobacco: Never Used  Vaping Use  . Vaping Use: Never used  Substance Use Topics  . Alcohol use: No  . Drug use: No    Types: "Crack" cocaine    Comment: last smoked 2011    Home Medications Prior to Admission medications   Medication Sig Start Date End Date Taking? Authorizing Provider  acyclovir ointment (ZOVIRAX) 5 % Apply 1 application topically every 3 (three) hours. 05/18/16   Micheline Chapman, NP  albuterol (PROVENTIL HFA;VENTOLIN HFA) 108 (90 Base) MCG/ACT inhaler Inhale 2 puffs into the lungs every 6 (six) hours as needed for wheezing or shortness of breath. 05/18/16   Micheline Chapman, NP  AMITIZA 24 MCG capsule Take 24 mcg by mouth 2 (two) times daily with a meal.  05/26/17   [provider]  aspirin EC 81 MG tablet Take 81  mg by mouth daily.    [provider]  budesonide-formoterol (SYMBICORT) 160-4.5 MCG/ACT inhaler Inhale 2 puffs into the lungs 2 (two) times daily. Patient taking differently: Inhale 2 puffs into the lungs 2 (two) times daily as needed (wheezing, sob).  05/18/16   Micheline Chapman, NP  celecoxib (CELEBREX) 200 MG capsule Take 1 capsule (200 mg total) by mouth 2 (two) times daily. Patient taking differently: Take 200 mg by mouth daily.  05/18/16   Micheline Chapman, NP  cetirizine (ZYRTEC) 10 MG tablet Take 1 tablet by mouth daily. 05/02/17 05/06/26  [provider]  chlorhexidine (HIBICLENS) 4 % external liquid Apply topically daily as needed. 12/12/16   Clent Demark, PA-C  citalopram (CELEXA) 40 MG tablet Take 1 tablet (40 mg total) by mouth daily. 09/27/17   Cloria Spring, MD  ergocalciferol (VITAMIN D2) 50000 units capsule Take 1 capsule (50,000 Units total)  by mouth once a week. 05/18/16   Micheline Chapman, NP  esomeprazole (NEXIUM) 40 MG capsule Take 1 capsule (40 mg total) by mouth daily at 12 noon. 05/18/16   Micheline Chapman, NP  famotidine (PEPCID) 20 MG tablet Take 1 tablet (20 mg total) by mouth 2 (two) times daily. 04/20/17   Khatri, Hina, PA-C  gabapentin (NEURONTIN) 300 MG capsule Take 2 capsules (600 mg total) by mouth 3 (three) times daily. 10/16/17   Clent Demark, PA-C  hydrochlorothiazide (HYDRODIURIL) 25 MG tablet Take 1 tablet (25 mg total) by mouth daily. 05/18/16   Micheline Chapman, NP  HYDROcodone-homatropine (HYCODAN) 5-1.5 MG/5ML syrup Take 5 mLs by mouth every 6 (six) hours as needed for cough. 07/02/19   Vanessa Kick, MD  hydroxychloroquine (PLAQUENIL) 200 MG tablet Take 1 tablet by mouth 2 (two) times daily.  03/21/17   [provider]  hydrOXYzine (ATARAX/VISTARIL) 25 MG tablet Take 1 tablet (25 mg total) by mouth at bedtime as needed for anxiety (insomnia). 07/31/17   Norman Clay, MD  ipratropium-albuterol (DUONEB) 0.5-2.5 (3) MG/3ML SOLN Take 3 mLs by nebulization every 6 (six) hours as needed. Patient taking differently: Take 3 mLs by nebulization every 6 (six) hours as needed (for SOB).  05/18/16   Micheline Chapman, NP  Misc. Devices (STEP N REST II WALKER) MISC 1 each by Does not apply route daily. 03/08/17   Clent Demark, PA-C  mupirocin cream (BACTROBAN) 2 % Apply 1 application topically 2 (two) times daily. To breast 04/13/17   Steve Rattler, DO  phenytoin (DILANTIN) 100 MG ER capsule Take 1 capsule (100 mg total) by mouth 3 (three) times daily. 05/18/16   Micheline Chapman, NP  pravastatin (PRAVACHOL) 20 MG tablet Take 1 tablet (20 mg total) by mouth daily. 05/18/16   Micheline Chapman, NP  predniSONE (STERAPRED UNI-PAK 21 TAB) 10 MG (21) TBPK tablet Take by mouth daily. Take as directed. 07/02/19   Vanessa Kick, MD  QUEtiapine (SEROQUEL) 100 MG tablet Take 100 mg by mouth at bedtime.  04/21/18   [provider]  triamcinolone cream (KENALOG) 0.1 % Apply topically 2 (two) times daily. 05/23/17   Clent Demark, PA-C  VOLTAREN 1 % GEL APPLY THREE TIMES PER DAY AS NEEDED TO AFFECTED AREA 06/16/17   [provider]    Allergies    Lortab [hydrocodone-acetaminophen] and Penicillins  Review of Systems   Review of Systems  Constitutional: Negative for chills and fever.  HENT: Positive for congestion.   Respiratory: Positive for cough.  Negative for shortness of breath.   Cardiovascular: Negative for chest pain.  Gastrointestinal: Negative for abdominal pain, nausea and vomiting.  Musculoskeletal: Positive for arthralgias, myalgias and neck pain.  Neurological: Negative for weakness and numbness.   Physical Exam Updated Vital Signs BP (!) 152/93 (BP Location: Left Arm)   Pulse (!) 124   Temp 99.1 F (37.3 C) (Oral)   Resp 18   Ht 5\' 2"  (1.575 m)   Wt 107 kg   SpO2 97%   BMI 43.16 kg/m   Physical Exam Vitals and nursing note reviewed.  Constitutional:      General: She is not in acute distress.    Appearance: Normal appearance. She is not ill-appearing, toxic-appearing or diaphoretic.  HENT:     Head: Normocephalic and atraumatic.     Right Ear: External ear normal.     Left Ear: External ear normal.     Nose: Nose normal.     Mouth/Throat:     Mouth: Mucous membranes are moist.     Pharynx: Oropharynx is clear. No oropharyngeal exudate or posterior oropharyngeal erythema.  Eyes:     General: No scleral icterus.       Right eye: No discharge.        Left eye: No discharge.     Conjunctiva/sclera: Conjunctivae normal.  Neck:     Comments: No midline C, T, L-spine tenderness. Cardiovascular:     Rate and Rhythm: Regular rhythm. Tachycardia present.     Pulses: Normal pulses.     Heart sounds: Normal heart sounds. No murmur heard.  No friction rub. No gallop.   Pulmonary:     Effort: Pulmonary effort is normal. No respiratory  distress.     Breath sounds: Normal breath sounds. No stridor. No wheezing, rhonchi or rales.  Abdominal:     General: Abdomen is flat.  Musculoskeletal:        General: Normal range of motion.     Cervical back: Normal range of motion and neck supple. No tenderness.     Comments: No palpable pain appreciated in the right shoulder or left hip.  Full range of motion of all 4 extremities.  Skin:    General: Skin is warm and dry.  Neurological:     General: No focal deficit present.     Mental Status: She is alert and oriented to person, place, and time.     Comments: Strength is 5 out of 5 in all 4 extremities.  Distal sensation intact.    Psychiatric:        Mood and Affect: Mood normal.        Behavior: Behavior normal.    ED Results / Procedures / Treatments   Labs (all labs ordered are listed, but only abnormal results are displayed) Labs Reviewed - No data to display  EKG None  Radiology No results found.  Procedures Procedures (including critical care time)  Medications Ordered in ED Medications  oxyCODONE-acetaminophen (PERCOCET/ROXICET) 5-325 MG per tablet 1 tablet (1 tablet Oral Given 12/28/19 1400)   ED Course  I have reviewed the triage vital signs and the nursing notes.  Pertinent labs & imaging results that were available during my care of the patient were reviewed by me and considered in my medical decision making (see chart for details).    MDM Rules/Calculators/A&P                          Pt is  a 54 y.o. female that present with a history and physical exam as noted above.  Pt presents today due to her chronic pain. No recent falls or injury. She states she had a pain contract but the office where she was receiving pain medications shut down about 1 month ago. I informed the patient that we do not fill prescriptions for chronic pain.   She also complains of a mild cough and congestion. Will d/c with a rx for [employer] LCTAB on my exam and pt is  speaking in clear and complete sentences.   Instructed to return to the emergency department with new or worsening sx. Her questions were answered. After being given pain medication patient became aggressive with staff and said "that's all I'm getting!". She then stood up and continued to be aggressive with staff. She repetitively threatened me and my co-workers that she was "going to call our supervisors" and then ambulated out of the emergency department unassisted with a steady gait.  Note: Portions of this report may have been transcribed using voice recognition software. Every effort was made to ensure accuracy; however, inadvertent computerized transcription errors may be present.   Final Clinical Impression(s) / ED Diagnoses Final diagnoses:  Cough  Other chronic pain    Rx / DC Orders ED Discharge Orders         Ordered    benzonatate (TESSALON) 100 MG capsule  3 times daily PRN     Discontinue  Reprint     12/28/19 1402           Rayna Sexton, PA-C 12/28/19 1428    Isla Pence, MD 12/28/19 1429

## 2019-12-28 NOTE — Discharge Instructions (Signed)
Feel free to return to the emergency department with any new or worsening symptoms.  I am prescribing you Tessalon Perles.  You can take these up to 3 times a day as needed for management of your cough.    It was a pleasure to meet you.

## 2019-12-28 NOTE — ED Notes (Addendum)
PT refused vitals, stating, "I came here for prescription not my blood pressure."  D/c not reviewed because patient snatched the discharge paper from this RN and stormed off angrily.

## 2020-02-14 ENCOUNTER — Encounter: Payer: Self-pay | Admitting: Physical Medicine and Rehabilitation

## 2020-03-03 ENCOUNTER — Other Ambulatory Visit: Payer: Self-pay

## 2020-03-03 ENCOUNTER — Encounter (HOSPITAL_COMMUNITY): Payer: Self-pay | Admitting: Emergency Medicine

## 2020-03-03 ENCOUNTER — Ambulatory Visit (HOSPITAL_COMMUNITY)
Admission: EM | Admit: 2020-03-03 | Discharge: 2020-03-03 | Disposition: A | Discharge status: Home / Self Care | Payer: Medicaid Other | Attending: Physician Assistant | Admitting: Physician Assistant

## 2020-03-03 DIAGNOSIS — L0292 Furuncle, unspecified: Secondary | ICD-10-CM | POA: Diagnosis not present

## 2020-03-03 MED ORDER — DOXYCYCLINE HYCLATE 100 MG PO CAPS: 100.0000 mg | ORAL_CAPSULE | Freq: Two times a day (BID) | ORAL | 0 refills | Status: AC

## 2020-03-03 NOTE — ED Triage Notes (Signed)
Patient presents to [facility] for assessment of 3 days of a growth to her left thigh.  States purulent discharge this morning.  Patient states are is growing and more painful every day.

## 2020-03-03 NOTE — Discharge Instructions (Signed)
Take medication as prescribed, take with plenty water  Apply warm compresses and keep clean  Follow with your primary care tomorrow as scheduled.  If not improving or worsening over the next 2 to 3 days return

## 2020-03-03 NOTE — ED Provider Notes (Signed)
[place]    CSN: [id number] Arrival date & time: 03/03/20  1741      History   Chief Complaint Chief Complaint  Patient presents with  . Mass    HPI Heather Perry is a 54 y.o. female.   Patient reports for painful bump that she believes drained.  She reports she noticed a bump on the left upper leg few days ago.  It is grown become more painful.  She thinks it was a white spot on it that possibly drained yesterday and today.  She has not any fevers or chills.  Never had anything like this before.     Past Medical History:  Diagnosis Date  . Allergy   . Anxiety   . Arthritis   . Asthma   . Blood transfusion without reported diagnosis   . Depression   . GERD (gastroesophageal reflux disease)   . High cholesterol   . Hypertension   . Obesity   . Osteoporosis   . Recurrent boils   . Seizures ([place])    last seizure "3 years ago" per pt 06-16-16 KBW  . Sleep apnea    no CPAP used  . Uterine fibroid     Patient Active Problem List   Diagnosis Date Noted  . Anxiety disorder 06/28/2017  . MDD (major depressive disorder), recurrent episode, mild ([place]) 06/28/2017  . Essential hypertension 06/26/2017  . Hyperlipidemia with target LDL less than 100 06/26/2017  . Chest pain with low risk for cardiac etiology 06/26/2017  . 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 Rash and nonspecific skin eruption 04/13/2017    Past Surgical History:  Procedure Laterality Date  . ABDOMINAL HYSTERECTOMY    . CESAREAN SECTION    . KNEE SURGERY Right    x2  . UPPER GASTROINTESTINAL ENDOSCOPY      OB History   No obstetric history on file.      Home Medications    Prior to Admission medications   Medication Sig Start Date End Date Taking? Authorizing Provider  acyclovir ointment (ZOVIRAX) 5 % Apply 1 application topically every 3 (three) hours. 05/18/16   Micheline Chapman, NP  albuterol (PROVENTIL HFA;VENTOLIN HFA)  108 (90 Base) MCG/ACT inhaler Inhale 2 puffs into the lungs every 6 (six) hours as needed for wheezing or shortness of breath. 05/18/16   Micheline Chapman, NP  AMITIZA 24 MCG capsule Take 24 mcg by mouth 2 (two) times daily with a meal.  05/26/17   [provider]  aspirin EC 81 MG tablet Take 81 mg by mouth daily.    [provider]  benzonatate (TESSALON) 100 MG capsule Take 1 capsule (100 mg total) by mouth 3 (three) times daily as needed for cough. 12/28/19   Rayna Sexton, PA-C  budesonide-formoterol (SYMBICORT) 160-4.5 MCG/ACT inhaler Inhale 2 puffs into the lungs 2 (two) times daily. Patient taking differently: Inhale 2 puffs into the lungs 2 (two) times daily as needed (wheezing, sob).  05/18/16   Micheline Chapman, NP  celecoxib (CELEBREX) 200 MG capsule Take 1 capsule (200 mg total) by mouth 2 (two) times daily. Patient taking differently: Take 200 mg by mouth daily.  05/18/16   Micheline Chapman, NP  cetirizine (ZYRTEC) 10 MG tablet Take 1 tablet by mouth daily. 05/02/17 05/06/26  [provider]  chlorhexidine (HIBICLENS) 4 % external liquid Apply topically daily as needed. 12/12/16   Clent Demark, PA-C  citalopram (CELEXA) 40  MG tablet Take 1 tablet (40 mg total) by mouth daily. 09/27/17   Cloria Spring, MD  doxycycline (VIBRAMYCIN) 100 MG capsule Take 1 capsule (100 mg total) by mouth 2 (two) times daily for 5 days. 03/03/20 03/08/20  Laurence Crofford, Marguerita Beards, PA-C  ergocalciferol (VITAMIN D2) 50000 units capsule Take 1 capsule (50,000 Units total) by mouth once a week. 05/18/16   Micheline Chapman, NP  esomeprazole (NEXIUM) 40 MG capsule Take 1 capsule (40 mg total) by mouth daily at 12 noon. 05/18/16   Micheline Chapman, NP  famotidine (PEPCID) 20 MG tablet Take 1 tablet (20 mg total) by mouth 2 (two) times daily. 04/20/17   Khatri, Hina, PA-C  gabapentin (NEURONTIN) 300 MG capsule Take 2 capsules (600 mg total) by mouth 3 (three) times daily. 10/16/17   Clent Demark, PA-C  hydrochlorothiazide (HYDRODIURIL) 25 MG tablet Take 1 tablet (25 mg total) by mouth daily. 05/18/16   Micheline Chapman, NP  HYDROcodone-homatropine (HYCODAN) 5-1.5 MG/5ML syrup Take 5 mLs by mouth every 6 (six) hours as needed for cough. 07/02/19   Vanessa Kick, MD  hydroxychloroquine (PLAQUENIL) 200 MG tablet Take 1 tablet by mouth 2 (two) times daily.  03/21/17   [provider]  hydrOXYzine (ATARAX/VISTARIL) 25 MG tablet Take 1 tablet (25 mg total) by mouth at bedtime as needed for anxiety (insomnia). 07/31/17   Norman Clay, MD  ipratropium-albuterol (DUONEB) 0.5-2.5 (3) MG/3ML SOLN Take 3 mLs by nebulization every 6 (six) hours as needed. Patient taking differently: Take 3 mLs by nebulization every 6 (six) hours as needed (for SOB).  05/18/16   Micheline Chapman, NP  Misc. Devices (STEP N REST II WALKER) MISC 1 each by Does not apply route daily. 03/08/17   Clent Demark, PA-C  mupirocin cream (BACTROBAN) 2 % Apply 1 application topically 2 (two) times daily. To breast 04/13/17   Steve Rattler, DO  phenytoin (DILANTIN) 100 MG ER capsule Take 1 capsule (100 mg total) by mouth 3 (three) times daily. 05/18/16   Micheline Chapman, NP  pravastatin (PRAVACHOL) 20 MG tablet Take 1 tablet (20 mg total) by mouth daily. 05/18/16   Micheline Chapman, NP  predniSONE (STERAPRED UNI-PAK 21 TAB) 10 MG (21) TBPK tablet Take by mouth daily. Take as directed. 07/02/19   Vanessa Kick, MD  QUEtiapine (SEROQUEL) 100 MG tablet Take 100 mg by mouth at bedtime. 04/21/18   [provider]  triamcinolone cream (KENALOG) 0.1 % Apply topically 2 (two) times daily. 05/23/17   Clent Demark, PA-C  VOLTAREN 1 % GEL APPLY THREE TIMES PER DAY AS NEEDED TO AFFECTED AREA 06/16/17   [provider]    Family History Family History  Problem Relation Age of Onset  . Breast cancer Maternal Aunt        unsure how old of onset  . Breast cancer Maternal Aunt         unsure how old of onset  . Colon cancer Neg Hx   . Esophageal cancer Neg Hx   . Rectal cancer Neg Hx   . Stomach cancer Neg Hx     Social History Social History   Tobacco Use  . Smoking status: Never Smoker  . Smokeless tobacco: Never Used  Vaping Use  . Vaping Use: Never used  Substance Use Topics  . Alcohol use: No  . Drug use: No    Types: "Crack" cocaine    Comment: last smoked 2011  Allergies   Lortab [hydrocodone-acetaminophen] and Penicillins   Review of Systems Review of Systems   Physical Exam Triage Vital Signs ED Triage Vitals  Enc Vitals Group     BP 03/03/20 1908 140/75     Pulse Rate 03/03/20 1908 84     Resp 03/03/20 1908 18     Temp 03/03/20 1908 98.1 F (36.7 C)     Temp Source 03/03/20 1908 Oral     SpO2 03/03/20 1908 100 %     Weight --      Height --      Head Circumference --      Peak Flow --      Pain Score 03/03/20 1906 6     Pain Loc --      Pain Edu? --      Excl. in [place]? --    No data found.  Updated Vital Signs BP 140/75 (BP Location: Right Arm)   Pulse 84   Temp 98.1 F (36.7 C) (Oral)   Resp 18   SpO2 100%   Visual Acuity Right Eye Distance:   Left Eye Distance:   Bilateral Distance:    Right Eye Near:   Left Eye Near:    Bilateral Near:     Physical Exam Vitals and nursing note reviewed.  Constitutional:      Appearance: Normal appearance.  Skin:    General: Skin is warm and dry.     Comments: Approximately 2.5 cm area of mild induration in the left posterior upper thigh.  Central head, no active drainage.  No significant fluctuance.  Neurological:     Mental Status: She is alert.      UC Treatments / Results  Labs (all labs ordered are listed, but only abnormal results are displayed) Labs Reviewed - No data to display  EKG   Radiology No results found.  Procedures Procedures (including critical care time)   Area cleansed with alcohol wipe and topical cold spray applied.  18-gauge needle  used unroofed central head, with slight probing and aspiration, no purulent discharge.  Patient tolerated very well.  Scant blood.  Medications Ordered in UC Medications - No data to display  Initial Impression / Assessment and Plan / UC Course  I have reviewed the triage vital signs and the nursing notes.  Pertinent labs & imaging results that were available during my care of the patient were reviewed by me and considered in my medical decision making (see chart for details).     #Furuncle Patient is a 54 year old presenting with appears to be a furuncle of the left posterior thigh.  Possible immature abscess.  No drainage once with 18-gauge unroofing and aspiration.  Recommend warm compresses and doxycycline.  Patient has follow-up with primary care tomorrow.  Discussed return precautions.  Patient verbalized understand plan of care Final Clinical Impressions(s) / UC Diagnoses   Final diagnoses:  Furuncle     Discharge Instructions     Take medication as prescribed, take with plenty water  Apply warm compresses and keep clean  Follow with your primary care tomorrow as scheduled.  If not improving or worsening over the next 2 to 3 days return     ED Prescriptions    Medication Sig Dispense Auth. Provider   doxycycline (VIBRAMYCIN) 100 MG capsule Take 1 capsule (100 mg total) by mouth 2 (two) times daily for 5 days. 10 capsule Collyns Mcquigg, Marguerita Beards, PA-C     PDMP not reviewed this encounter.  Purnell Shoemaker, PA-C 03/03/20 2312

## 2020-03-18 ENCOUNTER — Encounter
Payer: Medicaid Other | Attending: Physical Medicine and Rehabilitation | Admitting: Physical Medicine and Rehabilitation

## 2020-03-18 ENCOUNTER — Other Ambulatory Visit: Payer: Self-pay | Admitting: Physical Medicine and Rehabilitation

## 2020-03-18 ENCOUNTER — Other Ambulatory Visit: Payer: Self-pay

## 2020-03-18 ENCOUNTER — Encounter: Payer: Self-pay | Admitting: Physical Medicine and Rehabilitation

## 2020-03-18 VITALS — BP 124/84 | HR 100 | Temp 98.4°F | Ht 62.0 in | Wt 228.6 lb

## 2020-03-18 DIAGNOSIS — Z79891 Long term (current) use of opiate analgesic: Secondary | ICD-10-CM

## 2020-03-18 DIAGNOSIS — E559 Vitamin D deficiency, unspecified: Secondary | ICD-10-CM | POA: Insufficient documentation

## 2020-03-18 DIAGNOSIS — Z5181 Encounter for therapeutic drug level monitoring: Secondary | ICD-10-CM | POA: Diagnosis present

## 2020-03-18 DIAGNOSIS — G894 Chronic pain syndrome: Secondary | ICD-10-CM | POA: Insufficient documentation

## 2020-03-18 MED ORDER — CYCLOBENZAPRINE HCL 5 MG PO TABS: 5.0000 mg | ORAL_TABLET | Freq: Three times a day (TID) | ORAL | 0 refills | Status: DC | PRN

## 2020-03-18 MED ORDER — MELOXICAM 15 MG PO TABS: 15.0000 mg | ORAL_TABLET | Freq: Every day | ORAL | 0 refills | Status: DC

## 2020-03-18 NOTE — Patient Instructions (Signed)
Pain relieving food:   1) Ginger 2) Blueberries 3) Salmon 4) Pumpkin seeds 5) dark chocolate 6) turmeric 7) tart cherries 8) virgin olive oil 9) chilli peppers 10) mint 11) red wine

## 2020-03-18 NOTE — Progress Notes (Addendum)
Subjective:    Patient ID: Heather Perry, female    DOB: 03/19/1966, 54 y.o.   MRN: [id number]  HPI  Heather Perry is a 54 year old woman who presents to establish care for lower back pain that started 4 years ago. There was no inciting incident. She was told she has a slipped disc.   Reviewed Lumbar MRI from 2018 with her: IMPRESSION: T11-12: Advanced bilateral facet arthropathy with 3 mm of anterolisthesis. Bulging of the disc. No visible compressive stenosis. This level was not completely evaluated in the axial plane.  L3-4: 2 mm retrolisthesis. Mild bulging of the disc. Mild facet arthritis. No stenosis.  L4-5: Advanced bilateral facet arthropathy likely to be symptomatic. Edema in joint effusions. Anterolisthesis of 3 mm. Shallow broad-based herniation of the disc. Stenosis of the lateral recesses and neural foramina that could cause neural compression on either or both sides. Finding slightly worse on the left. Certainly, the facet arthropathy could be a cause of low back pain.  L5-S1: Chronic disc degeneration with endplate osteophytes and bulging of the disc. Facet osteoarthritis bilaterally. No compressive central canal stenosis. Bilateral foraminal stenosis that would have some potential to affect the exiting L5 nerves. The degenerative disc disease and degenerative facet disease could certainly contribute to low back pain.  She has had 2-3 spinal injections but they did not last long.   She has been having migraines every morning.   It hurts in flexion and extension.   Right knee has been operated and continues to hurt.   She has been taking Plaquenil BID for rheumatoid arthritis   Pain Inventory Average Pain 9 Pain Right Now 9 My pain is intermittent, sharp, stabbing and aching  In the last 24 hours, has pain interfered with the following? General activity 7 Relation with others 6 Enjoyment of life 9 What TIME of day is your pain at its worst?  varies Sleep (in general) Fair  Pain is worse with: walking, bending, sitting, standing and some activites Pain improves with: rest, heat/ice and medication Relief from Meds: 10  walk without assistance use a cane use a walker how many minutes can you walk? 5 mins ability to climb steps?  yes do you drive?  no transfers alone  disabled: date disabled 2016  bladder control problems tingling depression anxiety loss of taste or smell  New Patient  New Patient    Family History  Problem Relation Age of Onset  . Breast cancer Maternal Aunt        unsure how old of onset  . Breast cancer Maternal Aunt        unsure how old of onset  . Colon cancer Neg Hx   . Esophageal cancer Neg Hx   . Rectal cancer Neg Hx   . Stomach cancer Neg Hx    Social History   Socioeconomic History  . Marital status: Single    Spouse name: Not on file  . Number of children: Not on file  . Years of education: Not on file  . Highest education level: Not on file  Occupational History  . Not on file  Tobacco Use  . Smoking status: Never Smoker  . Smokeless tobacco: Never Used  Vaping Use  . Vaping Use: Never used  Substance and Sexual Activity  . Alcohol use: No  . Drug use: No    Types: "Crack" cocaine    Comment: last smoked 2011  . Sexual activity: Not on file  Other Topics Concern  .  Not on file  Social History Narrative  . Not on file   Social Determinants of Health   Financial Resource Strain:   . Difficulty of Paying Living Expenses: Not on file  Food Insecurity:   . Worried About Charity fundraiser in the Last Year: Not on file  . Ran Out of Food in the Last Year: Not on file  Transportation Needs:   . Lack of Transportation (Medical): Not on file  . Lack of Transportation (Non-Medical): Not on file  Physical Activity:   . Days of Exercise per Week: Not on file  . Minutes of Exercise per Session: Not on file  Stress:   . Feeling of Stress : Not on file  Social  Connections:   . Frequency of Communication with Friends and Family: Not on file  . Frequency of Social Gatherings with Friends and Family: Not on file  . Attends Religious Services: Not on file  . Active Member of Clubs or Organizations: Not on file  . Attends Archivist Meetings: Not on file  . Marital Status: Not on file   Past Surgical History:  Procedure Laterality Date  . ABDOMINAL HYSTERECTOMY    . CESAREAN SECTION    . KNEE SURGERY Right    x2  . UPPER GASTROINTESTINAL ENDOSCOPY     Past Medical History:  Diagnosis Date  . Allergy   . Anxiety   . Arthritis   . Asthma   . Blood transfusion without reported diagnosis   . Depression   . GERD (gastroesophageal reflux disease)   . High cholesterol   . Hypertension   . Obesity   . Osteoporosis   . Recurrent boils   . Seizures ([place])    last seizure "3 years ago" per pt 06-16-16 KBW  . Sleep apnea    no CPAP used  . Uterine fibroid    BP 124/84   Pulse 100   Temp 98.4 F (36.9 C)   Ht 5\' 2"  (1.575 m)   Wt 228 lb 9.6 oz (103.7 kg)   BMI 41.81 kg/m   Opioid Risk Score:   Fall Risk Score:  `1  Depression screen PHQ 2/9  Depression screen [facility] 2/9 03/18/2020 03/08/2017 01/11/2017 12/12/2016  Decreased Interest 1 0 0 1  Down, Depressed, Hopeless 1 0 0 1  PHQ - 2 Score 2 0 0 2  Altered sleeping 2 - - 1  Tired, decreased energy 1 - - 1  Change in appetite 1 - - 1  Feeling bad or failure about yourself  0 - - 1  Trouble concentrating 0 - - 1  Moving slowly or fidgety/restless 0 - - 1  Suicidal thoughts 0 - - 1  PHQ-9 Score 6 - - 9   Review of Systems  Respiratory: Positive for shortness of breath and wheezing.   Cardiovascular: Positive for leg swelling.  Genitourinary: Positive for frequency.  Musculoskeletal: Positive for arthralgias and gait problem.  Neurological:       Tingling  Psychiatric/Behavioral:       Depression. anxiety       Objective:   Physical ExamGen: no distress, normal  appearing HEENT: oral mucosa pink and moist, NCAT Cardio: Reg rate Chest: normal effort, normal rate of breathing Abd: soft, non-distended Ext: no edema Skin: intact Neuro: Weaker in left lower extremity 4-/5 thorughout. Otherwise 5/5.  Cervical kyphosis.  Psych: pleasant, normal affect    Assessment & Plan:  Heather Perry is a 54 year old  woman who presents to establish care for lower back pain.  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  -Discussed current symptoms of pain and history of pain.  -Discussed benefits of exercise in reducing pain. -Discussed following foods that may reduce pain: 1) Ginger 2) Blueberries 3) Salmon 4) Pumpkin seeds 5) dark chocolate 6) turmeric 7) tart cherries 8) virgin olive oil 9) chilli peppers 10) mint 11) red wine -She currently takes the Gabapentin 800mg  TID which does help. She is prescribed this for the headaches. This makes her sleepy. This helps her to sleep better at night.  -Tylenol does not help. NSAIDs do not help.  -She has tried muscle relaxants (Flexeril, voltaren gel).  -Continue therapy at home.  -Check Vitamin D level ordered on visit- resulted the following day and I called patient with results. 31.5 which is at low end of normal. Will supplement with 50,000U weekly for 7 weeks.   2) Migraines:  -could be cervicogenic.  -Prescribed flexeril for cervical muscle spasms.   3) Right knee pain: - Monovisc right knee visit. Discussed risks and benefits of the procedure.

## 2020-03-19 LAB — VITAMIN D 25 HYDROXY (VIT D DEFICIENCY, FRACTURES): Vit D, 25-Hydroxy: 31.5 ng/mL (ref 30.0–100.0)

## 2020-03-19 MED ORDER — ERGOCALCIFEROL 1.25 MG (50000 UT) PO CAPS: 50000.0000 [IU] | ORAL_CAPSULE | ORAL | 0 refills | Status: DC

## 2020-03-19 NOTE — Addendum Note (Signed)
Addended by: Izora Ribas on: 03/19/2020 08:31 AM   Modules accepted: Orders

## 2020-03-23 LAB — DRUG TOX MONITOR 1 W/CONF, ORAL FLD
Amphetamines: NEGATIVE ng/mL (ref <10)
Barbiturates: NEGATIVE ng/mL (ref <10)
Benzodiazepines: NEGATIVE ng/mL (ref <0.50)
Benzoylecgonine: 6.2 ng/mL — ABNORMAL HIGH (ref <5.0)
Buprenorphine: NEGATIVE ng/mL (ref <0.10)
Cocaine: 57.9 ng/mL — ABNORMAL HIGH (ref <5.0)
Cocaine: POSITIVE ng/mL — AB (ref <5.0)
Fentanyl: NEGATIVE ng/mL (ref <0.10)
Heroin Metabolite: NEGATIVE ng/mL (ref <1.0)
MARIJUANA: NEGATIVE ng/mL (ref <2.5)
MDMA: NEGATIVE ng/mL (ref <10)
Meprobamate: NEGATIVE ng/mL (ref <2.5)
Methadone: NEGATIVE ng/mL (ref <5.0)
Nicotine Metabolite: NEGATIVE ng/mL (ref <5.0)
Opiates: NEGATIVE ng/mL (ref <2.5)
Phencyclidine: NEGATIVE ng/mL (ref <10)
Tapentadol: NEGATIVE ng/mL (ref <5.0)
Tramadol: NEGATIVE ng/mL (ref <5.0)
Zolpidem: NEGATIVE ng/mL (ref <5.0)

## 2020-03-23 LAB — DRUG TOX ALC METAB W/CON, ORAL FLD
Alcohol Metabolite: POSITIVE ng/mL — AB (ref <25)
Ethyl Sulfate: 36 ng/mL — ABNORMAL HIGH (ref <25)

## 2020-03-25 ENCOUNTER — Telehealth: Payer: Self-pay | Admitting: *Deleted

## 2020-03-25 NOTE — Telephone Encounter (Signed)
Thank you for reviewing.

## 2020-03-25 NOTE — Telephone Encounter (Signed)
Oral swab is positive for cocaine and metabolite Benzoylecgonine and alcohol.

## 2020-03-26 NOTE — Telephone Encounter (Signed)
Patient's urine tox is positive for cocaine so we are limited in what we can give her. Please see if her appointment for Monovisc can be moved up- we can also discuss alternative oral pain medications at that time. Thank you!

## 2020-03-26 NOTE — Telephone Encounter (Signed)
We do not have an appointment any earlier. I have placed her on the wait list.

## 2020-03-26 NOTE — Telephone Encounter (Signed)
Asking for something for pain. She is not taking the medication for her knee, doesn't help.

## 2020-04-01 ENCOUNTER — Telehealth: Payer: Self-pay

## 2020-04-01 ENCOUNTER — Other Ambulatory Visit: Payer: Self-pay | Admitting: Physical Medicine and Rehabilitation

## 2020-04-01 NOTE — Telephone Encounter (Signed)
Patient called stating she in a lot of pain. Pain in back, arm, and knees. Would like something called in for her pain.

## 2020-04-13 ENCOUNTER — Encounter: Payer: Medicaid Other | Admitting: Physical Medicine and Rehabilitation

## 2020-04-13 ENCOUNTER — Other Ambulatory Visit: Payer: Self-pay | Admitting: Physical Medicine and Rehabilitation

## 2020-04-13 MED ORDER — DICLOFENAC SODIUM 1 % EX GEL: CUTANEOUS | 0 refills | Status: DC

## 2020-04-15 ENCOUNTER — Encounter: Payer: Self-pay | Admitting: Physical Medicine and Rehabilitation

## 2020-04-15 ENCOUNTER — Encounter
Payer: Medicaid Other | Attending: Physical Medicine and Rehabilitation | Admitting: Physical Medicine and Rehabilitation

## 2020-04-15 ENCOUNTER — Other Ambulatory Visit: Payer: Self-pay

## 2020-04-15 VITALS — BP 135/80 | HR 121 | Temp 97.9°F | Ht 62.0 in | Wt 232.4 lb

## 2020-04-15 DIAGNOSIS — Z79891 Long term (current) use of opiate analgesic: Secondary | ICD-10-CM | POA: Insufficient documentation

## 2020-04-15 DIAGNOSIS — E559 Vitamin D deficiency, unspecified: Secondary | ICD-10-CM | POA: Insufficient documentation

## 2020-04-15 DIAGNOSIS — G894 Chronic pain syndrome: Secondary | ICD-10-CM | POA: Insufficient documentation

## 2020-04-15 DIAGNOSIS — Z5181 Encounter for therapeutic drug level monitoring: Secondary | ICD-10-CM | POA: Insufficient documentation

## 2020-04-15 MED ORDER — TIZANIDINE HCL 2 MG PO CAPS: 2.0000 mg | ORAL_CAPSULE | Freq: Every day | ORAL | 0 refills | Status: DC | PRN

## 2020-04-15 NOTE — Progress Notes (Addendum)
Subjective:    Patient ID: Heather Perry, female    DOB: January 12, 1966, 54 y.o.   MRN: [id number]  HPI  [name] presents for her follow-up of her lower back pain.  MRI lumbar spine from  2018 reviewed: IMPRESSION: T11-12: Advanced bilateral facet arthropathy with 3 mm of anterolisthesis. Bulging of the disc. No visible compressive stenosis. This level was not completely evaluated in the axial plane.  L3-4: 2 mm retrolisthesis. Mild bulging of the disc. Mild facet arthritis. No stenosis.  L4-5: Advanced bilateral facet arthropathy likely to be symptomatic. Edema in joint effusions. Anterolisthesis of 3 mm. Shallow broad-based herniation of the disc. Stenosis of the lateral recesses and neural foramina that could cause neural compression on either or both sides. Finding slightly worse on the left. Certainly, the facet arthropathy could be a cause of low back pain.  L5-S1: Chronic disc degeneration with endplate osteophytes and bulging of the disc. Facet osteoarthritis bilaterally. No compressive central canal stenosis. Bilateral foraminal stenosis that would have some potential to affect the exiting L5 nerves. The degenerative disc disease and degenerative facet disease could certainly contribute to low back pain.  Average pain is 8/10, pain right now is 7/10, pain is aching.  Her urine sample was positive for cocaine so I cannot prescribe her oxycodone. She says she used cocaine a long time ago and has not been recently. She has drunk alcohol. I discussed that no drug, alcohol, or tobacco use are permitted when receiving controlled substances  Pain Inventory Average Pain 8 Pain Right Now 7 My pain is aching  In the last 24 hours, has pain interfered with the following? General activity 7 Relation with others 7 Enjoyment of life 6 What TIME of day is your pain at its worst? daytime, evening and night Sleep (in general) Fair  Pain is worse with: walking, bending,  sitting, standing and some activites Pain improves with: rest, heat/ice and medication Relief from Meds: 5  Family History  Problem Relation Age of Onset  . Breast cancer Maternal Aunt        unsure how old of onset  . Breast cancer Maternal Aunt        unsure how old of onset  . Colon cancer Neg Hx   . Esophageal cancer Neg Hx   . Rectal cancer Neg Hx   . Stomach cancer Neg Hx    Social History   Socioeconomic History  . Marital status: Single    Spouse name: Not on file  . Number of children: Not on file  . Years of education: Not on file  . Highest education level: Not on file  Occupational History  . Not on file  Tobacco Use  . Smoking status: Never Smoker  . Smokeless tobacco: Never Used  Vaping Use  . Vaping Use: Never used  Substance and Sexual Activity  . Alcohol use: No  . Drug use: No    Types: "Crack" cocaine    Comment: last smoked 2011  . Sexual activity: Not on file  Other Topics Concern  . Not on file  Social History Narrative  . Not on file   Social Determinants of Health   Financial Resource Strain:   . Difficulty of Paying Living Expenses: Not on file  Food Insecurity:   . Worried About Charity fundraiser in the Last Year: Not on file  . Ran Out of Food in the Last Year: Not on file  Transportation Needs:   . Lack  of Transportation (Medical): Not on file  . Lack of Transportation (Non-Medical): Not on file  Physical Activity:   . Days of Exercise per Week: Not on file  . Minutes of Exercise per Session: Not on file  Stress:   . Feeling of Stress : Not on file  Social Connections:   . Frequency of Communication with Friends and Family: Not on file  . Frequency of Social Gatherings with Friends and Family: Not on file  . Attends Religious Services: Not on file  . Active Member of Clubs or Organizations: Not on file  . Attends Archivist Meetings: Not on file  . Marital Status: Not on file   Past Surgical History:  Procedure  Laterality Date  . ABDOMINAL HYSTERECTOMY    . CESAREAN SECTION    . KNEE SURGERY Right    x2  . UPPER GASTROINTESTINAL ENDOSCOPY     Past Surgical History:  Procedure Laterality Date  . ABDOMINAL HYSTERECTOMY    . CESAREAN SECTION    . KNEE SURGERY Right    x2  . UPPER GASTROINTESTINAL ENDOSCOPY     Past Medical History:  Diagnosis Date  . Allergy   . Anxiety   . Arthritis   . Asthma   . Blood transfusion without reported diagnosis   . Depression   . GERD (gastroesophageal reflux disease)   . High cholesterol   . Hypertension   . Obesity   . Osteoporosis   . Recurrent boils   . Seizures ([place])    last seizure "3 years ago" per pt 06-16-16 KBW  . Sleep apnea    no CPAP used  . Uterine fibroid    BP 135/80   Pulse (!) 121   Temp 97.9 F (36.6 C)   Ht 5\' 2"  (1.575 m)   Wt 232 lb 6.4 oz (105.4 kg)   SpO2 97%   BMI 42.51 kg/m   Opioid Risk Score:   Fall Risk Score:  `1  Depression screen PHQ 2/9  Depression screen [facility] 2/9 03/18/2020 03/08/2017 01/11/2017 12/12/2016  Decreased Interest 1 0 0 1  Down, Depressed, Hopeless 1 0 0 1  PHQ - 2 Score 2 0 0 2  Altered sleeping 2 - - 1  Tired, decreased energy 1 - - 1  Change in appetite 1 - - 1  Feeling bad or failure about yourself  0 - - 1  Trouble concentrating 0 - - 1  Moving slowly or fidgety/restless 0 - - 1  Suicidal thoughts 0 - - 1  PHQ-9 Score 6 - - 9    Review of Systems  Musculoskeletal: Positive for back pain.       Knee pain  All other systems reviewed and are negative.      Objective:   Physical Exam Gen: no distress, normal appearing HEENT: oral mucosa pink and moist, NCAT Cardio: Reg rate Chest: normal effort, normal rate of breathing Abd: soft, non-distended Ext: no edema Skin: intact Neuro: Alert and oriented Musculoskeletal: Tenderness to palpation over lumbar spine diffusely. Impaired flexion and extension. Diffuse, non-focal weakness of lower extremities.  Psych: pleasant, normal  affect    Assessment & Plan:  [name] is a 54 year old woman who presents for follow-up of low back pain.  1) Low back pain -MRI reviewed with patient as follows:  IMPRESSION: T11-12: Advanced bilateral facet arthropathy with 3 mm of anterolisthesis. Bulging of the disc. No visible compressive stenosis. This level was not completely evaluated in  the axial plane.  L3-4: 2 mm retrolisthesis. Mild bulging of the disc. Mild facet arthritis. No stenosis.  L4-5: Advanced bilateral facet arthropathy likely to be symptomatic. Edema in joint effusions. Anterolisthesis of 3 mm. Shallow broad-based herniation of the disc. Stenosis of the lateral recesses and neural foramina that could cause neural compression on either or both sides. Finding slightly worse on the left. Certainly, the facet arthropathy could be a cause of low back pain.  L5-S1: Chronic disc degeneration with endplate osteophytes and bulging of the disc. Facet osteoarthritis bilaterally. No compressive central canal stenosis. Bilateral foraminal stenosis that would have some potential to affect the exiting L5 nerves. The degenerative disc disease and degenerative facet disease could certainly contribute to low back pain.   -She may be a good candidate for ESI if interested. -She has not benefited from trigger point injections in the past. -She would like to restart Oxycodone for her back pain. -I have prescribed Tizanidine for muscle spasm.  Repeat LFT level in 8 weeks. Reviewed and normal when last checked, but that was a while ago.   2) Alcohol abuse:  -Educated that no alcohol abuse is permitted while receiving controlled substance.  -She agrees to abstain from alcohol use.  3) Cocaine abuse: -Educated that no cocaine is permitted while receiving controlled substance. I cannot prescribe oxycodone as there is evidence of cocaine in her urine sample.  -She agrees to abstain from drug use.   4) Right knee  pain: -Return in 1 week for monovisc injection  5) Vitamin D at the bottom of normal range (31.5- normal range is 30-100). -Supplement with 50,000 units x7 weeks and repeat level afterward.

## 2020-04-16 ENCOUNTER — Telehealth: Payer: Self-pay | Admitting: *Deleted

## 2020-04-16 ENCOUNTER — Other Ambulatory Visit: Payer: Self-pay

## 2020-04-16 MED ORDER — VITAMIN D (ERGOCALCIFEROL) 1.25 MG (50000 UNIT) PO CAPS: 50000.0000 [IU] | ORAL_CAPSULE | ORAL | 0 refills | Status: DC

## 2020-04-16 NOTE — Telephone Encounter (Signed)
Heather Perry says the Dr Ranell Patrick was going to send in an Rx for vitamin d to the pharmacy but she did not.  Please advise.

## 2020-04-16 NOTE — Telephone Encounter (Signed)
I have sent it today!

## 2020-04-16 NOTE — Addendum Note (Signed)
Addended by: Izora Ribas on: 04/16/2020 09:05 AM   Modules accepted: Orders

## 2020-04-17 ENCOUNTER — Ambulatory Visit: Payer: Medicaid Other | Admitting: Physical Medicine and Rehabilitation

## 2020-04-17 NOTE — Telephone Encounter (Signed)
Notified. 

## 2020-04-23 ENCOUNTER — Telehealth: Payer: Self-pay | Admitting: *Deleted

## 2020-04-23 ENCOUNTER — Other Ambulatory Visit: Payer: Self-pay | Admitting: Physical Medicine and Rehabilitation

## 2020-04-23 NOTE — Telephone Encounter (Signed)
Mrs Treadwell has called twice this morning about getting a stronger muscle relaxer or something because her back is hurting her so bad she cannot get out of bed. She is scheduled for a monovisc injection 04/29/20.  Do you want a repeat UDS on that day> Please advise.

## 2020-04-23 NOTE — Telephone Encounter (Signed)
Please advise her to take two of the pills at one time (total of 4mg ) and yes we can repeat that next visit, thank you!

## 2020-04-24 ENCOUNTER — Other Ambulatory Visit: Payer: Self-pay | Admitting: Physical Medicine and Rehabilitation

## 2020-04-24 MED ORDER — TIZANIDINE HCL 4 MG PO TABS: 4.0000 mg | ORAL_TABLET | Freq: Three times a day (TID) | ORAL | 1 refills | Status: DC

## 2020-04-24 NOTE — Telephone Encounter (Signed)
I have just sent her the 4mg  tablets so she can go ahead and take 1 of these three times per day as needed, thank you!

## 2020-04-24 NOTE — Telephone Encounter (Signed)
I have notified her and instructed of the dose change.

## 2020-04-27 ENCOUNTER — Telehealth: Payer: Self-pay | Admitting: *Deleted

## 2020-04-27 NOTE — Telephone Encounter (Signed)
Heather Perry has called multiple times saying she is having side effects from the medication--hearing voices and hallucinating.  She is in so much pain and asking what she should do.

## 2020-04-28 NOTE — Telephone Encounter (Signed)
Please let her know to stop the medication and we will discuss other options tomorrow!

## 2020-04-28 NOTE — Telephone Encounter (Signed)
Notified. 

## 2020-04-29 ENCOUNTER — Encounter: Payer: Self-pay | Admitting: Physical Medicine and Rehabilitation

## 2020-04-29 ENCOUNTER — Encounter
Payer: Medicaid Other | Attending: Physical Medicine and Rehabilitation | Admitting: Physical Medicine and Rehabilitation

## 2020-04-29 ENCOUNTER — Other Ambulatory Visit: Payer: Self-pay

## 2020-04-29 VITALS — BP 124/75 | HR 100 | Temp 98.3°F | Ht 62.0 in | Wt 238.0 lb

## 2020-04-29 DIAGNOSIS — M5441 Lumbago with sciatica, right side: Secondary | ICD-10-CM | POA: Diagnosis present

## 2020-04-29 DIAGNOSIS — Z5181 Encounter for therapeutic drug level monitoring: Secondary | ICD-10-CM

## 2020-04-29 DIAGNOSIS — Z79891 Long term (current) use of opiate analgesic: Secondary | ICD-10-CM

## 2020-04-29 DIAGNOSIS — G8929 Other chronic pain: Secondary | ICD-10-CM | POA: Diagnosis present

## 2020-04-29 DIAGNOSIS — M5442 Lumbago with sciatica, left side: Secondary | ICD-10-CM

## 2020-04-29 DIAGNOSIS — G894 Chronic pain syndrome: Secondary | ICD-10-CM

## 2020-04-29 MED ORDER — CYCLOBENZAPRINE HCL 10 MG PO TABS: 10.0000 mg | ORAL_TABLET | Freq: Three times a day (TID) | ORAL | 1 refills | Status: DC | PRN

## 2020-04-29 MED ORDER — CETIRIZINE HCL 10 MG PO TABS
10.0000 mg | ORAL_TABLET | Freq: Every day | ORAL | 1 refills | Status: DC
Start: 2020-04-29 — End: 2022-02-11

## 2020-04-29 MED ORDER — CYCLOBENZAPRINE HCL 5 MG PO TABS: 5.0000 mg | ORAL_TABLET | Freq: Three times a day (TID) | ORAL | 1 refills | Status: DC | PRN

## 2020-04-29 MED ORDER — GABAPENTIN 300 MG PO CAPS: 300.0000 mg | ORAL_CAPSULE | Freq: Three times a day (TID) | ORAL | 1 refills | Status: DC

## 2020-04-29 NOTE — Progress Notes (Signed)
Knee injection, right   Indication:R Knee pain not relieved by medication management and other conservative care.  Informed consent was obtained after describing risks and benefits of the procedure with the patient, this includes bleeding, bruising, infection and medication side effects. The patient wishes to proceed and has given written consent. The patient was placed in a recumbent position. The medial aspect of the knee was marked and prepped with Betadine and alcohol. It was then entered with a 25-gauge 1-1/2 inch needle and Monovisc was inserted into the knee joint after negative draw back for blood. The patient tolerated the procedure well. Post procedure instructions were given.

## 2020-04-30 ENCOUNTER — Ambulatory Visit (HOSPITAL_COMMUNITY)
Admission: RE | Admit: 2020-04-30 | Discharge: 2020-04-30 | Disposition: A | Discharge status: Home / Self Care | Payer: Medicaid Other | Source: Ambulatory Visit | Attending: Physical Medicine and Rehabilitation | Admitting: Physical Medicine and Rehabilitation

## 2020-04-30 DIAGNOSIS — M5442 Lumbago with sciatica, left side: Secondary | ICD-10-CM | POA: Insufficient documentation

## 2020-04-30 DIAGNOSIS — G8929 Other chronic pain: Secondary | ICD-10-CM

## 2020-04-30 DIAGNOSIS — M5441 Lumbago with sciatica, right side: Secondary | ICD-10-CM | POA: Insufficient documentation

## 2020-05-05 ENCOUNTER — Other Ambulatory Visit: Payer: Self-pay | Admitting: Physical Medicine and Rehabilitation

## 2020-05-05 ENCOUNTER — Telehealth: Payer: Self-pay | Admitting: *Deleted

## 2020-05-05 LAB — TOXASSURE SELECT,+ANTIDEPR,UR

## 2020-05-05 MED ORDER — OXYCODONE-ACETAMINOPHEN 5-325 MG PO TABS
1.0000 | ORAL_TABLET | Freq: Every day | ORAL | 0 refills | Status: DC | PRN
Start: 2020-05-05 — End: 2020-08-11

## 2020-05-05 NOTE — Telephone Encounter (Signed)
Urine drug screen for this encounter is consistent for prescribed medication 

## 2020-05-05 NOTE — Telephone Encounter (Signed)
Mrs Kawa called and said that her pain medication is needing a prior auth.

## 2020-05-06 NOTE — Telephone Encounter (Signed)
Prior authorization submitted via covermymeds to [employer] privatized medicaid for oxycodone-acetaminophen 5-325mg .  Awaiting response

## 2020-05-06 NOTE — Telephone Encounter (Addendum)
Prior authorization for oxycodone acetaminophen 5-325 mg APPROVED.  05/06/2020 - 11/02/2020  Pharmacy notified Patient notified

## 2020-05-21 ENCOUNTER — Other Ambulatory Visit: Payer: Self-pay | Admitting: Physical Medicine and Rehabilitation

## 2020-06-01 ENCOUNTER — Other Ambulatory Visit: Payer: Self-pay | Admitting: Physical Medicine and Rehabilitation

## 2020-06-01 NOTE — Telephone Encounter (Signed)
Mrs. Wallenstein c/o itching after taking Oxycodone Acetaminophen 5-325. She also said it was not helping her  pain. Mrs. Urbani has requested a replacement pain med.    Please advise.   Call back phone number [phone].

## 2020-06-09 ENCOUNTER — Other Ambulatory Visit: Payer: Self-pay

## 2020-06-09 ENCOUNTER — Encounter
Payer: Medicaid Other | Attending: Physical Medicine and Rehabilitation | Admitting: Physical Medicine and Rehabilitation

## 2020-06-09 VITALS — BP 151/84 | HR 99 | Temp 98.0°F | Ht 62.0 in | Wt 234.4 lb

## 2020-06-09 DIAGNOSIS — G894 Chronic pain syndrome: Secondary | ICD-10-CM | POA: Diagnosis not present

## 2020-06-09 DIAGNOSIS — Z79891 Long term (current) use of opiate analgesic: Secondary | ICD-10-CM | POA: Insufficient documentation

## 2020-06-09 DIAGNOSIS — Z5181 Encounter for therapeutic drug level monitoring: Secondary | ICD-10-CM | POA: Diagnosis present

## 2020-06-09 DIAGNOSIS — I10 Essential (primary) hypertension: Secondary | ICD-10-CM | POA: Diagnosis not present

## 2020-06-09 MED ORDER — FLUTICASONE PROPIONATE 50 MCG/ACT NA SUSP: 2.0000 | Freq: Every day | NASAL | 1 refills | Status: DC

## 2020-06-09 MED ORDER — BENAZEPRIL-HYDROCHLOROTHIAZIDE 20-12.5 MG PO TABS: 1.0000 | ORAL_TABLET | Freq: Every day | ORAL | 1 refills | Status: DC

## 2020-06-09 MED ORDER — OXYCODONE HCL 10 MG PO TABS: 10.0000 mg | ORAL_TABLET | Freq: Two times a day (BID) | ORAL | 0 refills | Status: DC | PRN

## 2020-06-09 MED ORDER — AMITIZA 24 MCG PO CAPS
24.0000 ug | ORAL_CAPSULE | Freq: Two times a day (BID) | ORAL | 1 refills | Status: DC
Start: 2020-06-09 — End: 2020-08-20

## 2020-06-09 MED ORDER — CELECOXIB 200 MG PO CAPS: 200.0000 mg | ORAL_CAPSULE | Freq: Two times a day (BID) | ORAL | 1 refills | Status: DC

## 2020-06-09 NOTE — Progress Notes (Signed)
Subjective:    Patient ID: Heather Perry, female    DOB: 1966/05/09, 54 y.o.   MRN: [id number]  HPI  Heather Perry presents for her follow-up of her facet arthropathy and L3-L4 retrolisthesis.   MRI lumbar spine from  2018 reviewed: IMPRESSION: T11-12: Advanced bilateral facet arthropathy with 3 mm of anterolisthesis. Bulging of the disc. No visible compressive stenosis. This level was not completely evaluated in the axial plane.  L3-4: 2 mm retrolisthesis. Mild bulging of the disc. Mild facet arthritis. No stenosis.  L4-5: Advanced bilateral facet arthropathy likely to be symptomatic. Edema in joint effusions. Anterolisthesis of 3 mm. Shallow broad-based herniation of the disc. Stenosis of the lateral recesses and neural foramina that could cause neural compression on either or both sides. Finding slightly worse on the left. Certainly, the facet arthropathy could be a cause of low back pain.  L5-S1: Chronic disc degeneration with endplate osteophytes and bulging of the disc. Facet osteoarthritis bilaterally. No compressive central canal stenosis. Bilateral foraminal stenosis that would have some potential to affect the exiting L5 nerves. The degenerative disc disease and degenerative facet disease could certainly contribute to low back pain.  Average pain is 9/10, pain right now is 9/10, pain is aching, throbbing, stabbing  Patient had returned for repeat urine sample that was clear. I prescribed Percocet but this made her itch. She prefers to try Roxicodone instead but prior Josem Kaufmann is required. Pain continues to be severe and quality of life continues to be diminished as a result.  She would also like to try Celebrex to help with her knee arthritis.   Her blood pressure is elevated. Her insurance did not approve her benzapril, so I have transitioned to amlodipine.   She is having some allergies and requests flonase.   Pain Inventory Average Pain 9 Pain Right Now 9 My  pain is stabbing and aching  In the last 24 hours, has pain interfered with the following? General activity 6 Relation with others 7 Enjoyment of life 8 What TIME of day is your pain at its worst? daytime and night Sleep (in general) Fair  Pain is worse with: walking, bending, sitting and standing Pain improves with: rest and medication Relief from Meds: 9  Family History  Problem Relation Age of Onset  . Breast cancer Maternal Aunt        unsure how old of onset  . Breast cancer Maternal Aunt        unsure how old of onset  . Colon cancer Neg Hx   . Esophageal cancer Neg Hx   . Rectal cancer Neg Hx   . Stomach cancer Neg Hx    Social History   Socioeconomic History  . Marital status: Single    Spouse name: Not on file  . Number of children: Not on file  . Years of education: Not on file  . Highest education level: Not on file  Occupational History  . Not on file  Tobacco Use  . Smoking status: Never Smoker  . Smokeless tobacco: Never Used  Vaping Use  . Vaping Use: Never used  Substance and Sexual Activity  . Alcohol use: No  . Drug use: No    Types: "Crack" cocaine    Comment: last smoked 2011  . Sexual activity: Not on file  Other Topics Concern  . Not on file  Social History Narrative  . Not on file   Social Determinants of Health   Financial Resource Strain: Not on file  Food Insecurity: Not on file  Transportation Needs: Not on file  Physical Activity: Not on file  Stress: Not on file  Social Connections: Not on file   Past Surgical History:  Procedure Laterality Date  . ABDOMINAL HYSTERECTOMY    . CESAREAN SECTION    . KNEE SURGERY Right    x2  . UPPER GASTROINTESTINAL ENDOSCOPY     Past Surgical History:  Procedure Laterality Date  . ABDOMINAL HYSTERECTOMY    . CESAREAN SECTION    . KNEE SURGERY Right    x2  . UPPER GASTROINTESTINAL ENDOSCOPY     Past Medical History:  Diagnosis Date  . Allergy   . Anxiety   . Arthritis   .  Asthma   . Blood transfusion without reported diagnosis   . Depression   . GERD (gastroesophageal reflux disease)   . High cholesterol   . Hypertension   . Obesity   . Osteoporosis   . Recurrent boils   . Seizures ([place])    last seizure "3 years ago" per pt 06-16-16 KBW  . Sleep apnea    no CPAP used  . Uterine fibroid    BP (!) 151/84   Pulse 99   Temp 98 F (36.7 C)   Ht 5\' 2"  (1.575 m)   Wt 234 lb 6.4 oz (106.3 kg)   SpO2 94%   BMI 42.87 kg/m   Opioid Risk Score:   Fall Risk Score:  `1  Depression screen PHQ 2/9  Depression screen [facility] 2/9 03/18/2020 03/08/2017 01/11/2017 12/12/2016  Decreased Interest 1 0 0 1  Down, Depressed, Hopeless 1 0 0 1  PHQ - 2 Score 2 0 0 2  Altered sleeping 2 - - 1  Tired, decreased energy 1 - - 1  Change in appetite 1 - - 1  Feeling bad or failure about yourself  0 - - 1  Trouble concentrating 0 - - 1  Moving slowly or fidgety/restless 0 - - 1  Suicidal thoughts 0 - - 1  PHQ-9 Score 6 - - 9    Review of Systems  Musculoskeletal: Positive for back pain.       Knee pain Hip pain  All other systems reviewed and are negative.     Objective:   Physical Exam Gen: no distress, normal appearing HEENT: oral mucosa pink and moist, NCAT Cardio: Reg rate Chest: normal effort, normal rate of breathing Abd: soft, non-distended Ext: no edema Skin: intact Musculoskeletal: Tenderness to palpation over lumbar spine diffusely. Impaired flexion and extension. Diffuse, non-focal weakness of lower extremities.  Psych: pleasant, normal affect    Assessment & Plan:  Heather Perry is a 54 year old woman who presents for follow-up of low back pain.  1) Low back pain -MRI reviewed with patient as follows:  IMPRESSION: T11-12: Advanced bilateral facet arthropathy with 3 mm of anterolisthesis. Bulging of the disc. No visible compressive stenosis. This level was not completely evaluated in the axial plane.  L3-4: 2 mm retrolisthesis. Mild bulging  of the disc. Mild facet arthritis. No stenosis.  L4-5: Advanced bilateral facet arthropathy likely to be symptomatic. Edema in joint effusions. Anterolisthesis of 3 mm. Shallow broad-based herniation of the disc. Stenosis of the lateral recesses and neural foramina that could cause neural compression on either or both sides. Finding slightly worse on the left. Certainly, the facet arthropathy could be a cause of low back pain.  L5-S1: Chronic disc degeneration with endplate osteophytes and bulging of the disc.  Facet osteoarthritis bilaterally. No compressive central canal stenosis. Bilateral foraminal stenosis that would have some potential to affect the exiting L5 nerves. The degenerative disc disease and degenerative facet disease could certainly contribute to low back pain.  -She may be a good candidate for ESI if interested. -She has not benefited from trigger point injections in the past. -She would like to restart Oxycodone for her back pain. I have sent her a refill and prior authorization is needed. Discussed with Lacretia Nicks who is helping with this right now. -Discussed that since she has been off medication, we must start at much lower dose. She was disappointed, but discussed risk of overdose with starting with former dose right away, and she expressed understanding.  -I have prescribed Tizanidine for muscle spasm.  Repeat LFT level next visit. Reviewed and normal when last checked, but that was a while ago.   2) History of ETOH use: -Educated that no alcohol abuse is permitted while receiving controlled substance.  -She agrees to abstain from alcohol use.  3) History of cocaine abuse: -Patient says this was a one time occurrence.  -Discussed that no recreational drug use is permitted while receiving controlled substances and she expresses understanding.    4) Right knee pain: -Received Monovisc injection with good effect -Prescribed celebrex. Advised regarding  increased risk of stroke and bleed while on this medication and advised to use sparingly.   5) Vitamin D at the bottom of normal range (31.5- normal range is 30-100). -Supplement with 50,000 units x7 weeks and repeat level afterward.   6) HTN: amlodipine prescribed  7) Allergies: Flonase prescribed.

## 2020-06-10 ENCOUNTER — Telehealth: Payer: Self-pay

## 2020-06-10 ENCOUNTER — Encounter: Payer: Self-pay | Admitting: Physical Medicine and Rehabilitation

## 2020-06-10 MED ORDER — AMLODIPINE BESYLATE 5 MG PO TABS: 5.0000 mg | ORAL_TABLET | Freq: Every day | ORAL | 1 refills | Status: DC

## 2020-06-10 NOTE — Telephone Encounter (Signed)
Mrs. Kropp Medicaid will not pay for Benazepril-HCTZ. But will pay for HCTZ.    Please advise or send Rx. Thank you CB PH: [phone]

## 2020-06-10 NOTE — Telephone Encounter (Signed)
Please let her know I have sent her amlodipine for her HTN. Thank you!

## 2020-06-17 LAB — TOXASSURE SELECT,+ANTIDEPR,UR

## 2020-06-24 ENCOUNTER — Telehealth: Payer: Self-pay | Admitting: *Deleted

## 2020-06-24 NOTE — Telephone Encounter (Signed)
Agree with retesting

## 2020-06-24 NOTE — Telephone Encounter (Signed)
Urine drug screen for this encounter is negative for oxycodone. She reported that she took a dose of the medication on the same day as the test. The test should have been positive for metabolites (at least).

## 2020-06-25 NOTE — Telephone Encounter (Addendum)
Scheduled for next visit. Formal warning letter mailed to patient and sent through [facility] for having alcohol and illegal drugs in previous screen.

## 2020-07-08 ENCOUNTER — Other Ambulatory Visit: Payer: Self-pay | Admitting: Physical Medicine and Rehabilitation

## 2020-07-08 ENCOUNTER — Telehealth: Payer: Self-pay | Admitting: *Deleted

## 2020-07-08 MED ORDER — DICLOFENAC SODIUM 1 % EX GEL
CUTANEOUS | 0 refills | Status: DC
Start: 2020-07-08 — End: 2020-07-09

## 2020-07-08 MED ORDER — CYCLOBENZAPRINE HCL 10 MG PO TABS: 10.0000 mg | ORAL_TABLET | Freq: Three times a day (TID) | ORAL | 1 refills | Status: DC | PRN

## 2020-07-08 MED ORDER — OXYCODONE HCL 15 MG PO TABS
15.0000 mg | ORAL_TABLET | Freq: Two times a day (BID) | ORAL | 0 refills | Status: DC | PRN
Start: 2020-07-08 — End: 2020-07-23

## 2020-07-08 NOTE — Telephone Encounter (Signed)
Heather Perry called for a refill on voltaren gel, muscle relaxer, and her roxicodone.  She reports that the roxicodone is not enough and she would like to be placed on 15 or 20  mg.  10 mg is not helping. Please advise.

## 2020-07-08 NOTE — Telephone Encounter (Signed)
Please let her know that I have filled all three medications, that I have increased dose of oxycodone to 15mg  BID and that this can be be filled 30 days from last fill date. Thank you!

## 2020-07-09 ENCOUNTER — Other Ambulatory Visit: Payer: Self-pay | Admitting: Physical Medicine and Rehabilitation

## 2020-07-09 NOTE — Telephone Encounter (Signed)
Notified. 

## 2020-07-13 ENCOUNTER — Other Ambulatory Visit: Payer: Self-pay | Admitting: Family Medicine

## 2020-07-13 DIAGNOSIS — Z1231 Encounter for screening mammogram for malignant neoplasm of breast: Secondary | ICD-10-CM

## 2020-07-23 ENCOUNTER — Telehealth: Payer: Self-pay | Admitting: Physical Medicine and Rehabilitation

## 2020-07-23 ENCOUNTER — Encounter: Payer: Self-pay | Admitting: Physical Medicine and Rehabilitation

## 2020-07-23 ENCOUNTER — Encounter
Payer: Medicaid Other | Attending: Physical Medicine and Rehabilitation | Admitting: Physical Medicine and Rehabilitation

## 2020-07-23 ENCOUNTER — Other Ambulatory Visit: Payer: Self-pay

## 2020-07-23 DIAGNOSIS — Z79891 Long term (current) use of opiate analgesic: Secondary | ICD-10-CM | POA: Insufficient documentation

## 2020-07-23 DIAGNOSIS — G894 Chronic pain syndrome: Secondary | ICD-10-CM | POA: Insufficient documentation

## 2020-07-23 DIAGNOSIS — J45909 Unspecified asthma, uncomplicated: Secondary | ICD-10-CM | POA: Insufficient documentation

## 2020-07-23 DIAGNOSIS — R52 Pain, unspecified: Secondary | ICD-10-CM | POA: Diagnosis present

## 2020-07-23 DIAGNOSIS — Z5181 Encounter for therapeutic drug level monitoring: Secondary | ICD-10-CM | POA: Diagnosis present

## 2020-07-23 MED ORDER — OXYCODONE HCL 15 MG PO TABS: 15.0000 mg | ORAL_TABLET | Freq: Three times a day (TID) | ORAL | 0 refills | Status: DC | PRN

## 2020-07-23 MED ORDER — GABAPENTIN 300 MG PO CAPS
300.0000 mg | ORAL_CAPSULE | Freq: Three times a day (TID) | ORAL | 1 refills | Status: DC
Start: 2020-07-23 — End: 2020-08-20

## 2020-07-23 NOTE — Progress Notes (Signed)
Subjective:    Patient ID: Heather Perry, female    DOB: 04-Feb-1966, 55 y.o.   MRN: [id number]  HPI  Heather Perry presents for her follow-up of her facet arthropathy and L3-L4 retrolisthesis, and cervical spinal stenosis.  1) Lumbar facet arthrosis:  MRI lumbar spine from  2018 reviewed: IMPRESSION: T11-12: Advanced bilateral facet arthropathy with 3 mm of anterolisthesis. Bulging of the disc. No visible compressive stenosis. This level was not completely evaluated in the axial plane.  L3-4: 2 mm retrolisthesis. Mild bulging of the disc. Mild facet arthritis. No stenosis.  L4-5: Advanced bilateral facet arthropathy likely to be symptomatic. Edema in joint effusions. Anterolisthesis of 3 mm. Shallow broad-based herniation of the disc. Stenosis of the lateral recesses and neural foramina that could cause neural compression on either or both sides. Finding slightly worse on the left. Certainly, the facet arthropathy could be a cause of low back pain.  L5-S1: Chronic disc degeneration with endplate osteophytes and bulging of the disc. Facet osteoarthritis bilaterally. No compressive central canal stenosis. Bilateral foraminal stenosis that would have some potential to affect the exiting L5 nerves. The degenerative disc disease and degenerative facet disease could certainly contribute to low back pain.  Average pain is 9/10, pain right now is 9/10, pain is aching, throbbing, stabbing  She does report that her pain is better today, but still severe.   She was told by her pharmacist that her Dilantin can interfere with oxycodone absorption and that is why she may require higher doses for pain relief.   Patient had returned for repeat urine sample that was clear.   We have repeated urine sample again today.  I prescribed Percocet but this made her itch. She prefers to try Roxicodone instead but prior Josem Kaufmann is required.   Right knee pain: She would also like to try Celebrex to  help with her knee arthritis. Viscosupplementation has helped alleviate her pain, though she continues to have buckling at times.   Her blood pressure is elevated. Her insurance did not approve her benzapril, so I have transitioned to amlodipine.   She is having some allergies and requests flonase.   She currently has no PCP.   She has been worsening symptoms of pain radiating from her neck into her right arm.   Pain Inventory Average Pain 9 Pain Right Now 9 My pain is stabbing and aching  In the last 24 hours, has pain interfered with the following? General activity 6 Relation with others 7 Enjoyment of life 8 What TIME of day is your pain at its worst? daytime and night Sleep (in general) Fair  Pain is worse with: walking, bending, sitting and standing Pain improves with: rest and medication Relief from Meds: 9  Family History  Problem Relation Age of Onset  . Breast cancer Maternal Aunt        unsure how old of onset  . Breast cancer Maternal Aunt        unsure how old of onset  . Colon cancer Neg Hx   . Esophageal cancer Neg Hx   . Rectal cancer Neg Hx   . Stomach cancer Neg Hx    Social History   Socioeconomic History  . Marital status: Single    Spouse name: Not on file  . Number of children: Not on file  . Years of education: Not on file  . Highest education level: Not on file  Occupational History  . Not on file  Tobacco Use  .  Smoking status: Never Smoker  . Smokeless tobacco: Never Used  Vaping Use  . Vaping Use: Never used  Substance and Sexual Activity  . Alcohol use: No  . Drug use: No    Types: "Crack" cocaine    Comment: last smoked 2011  . Sexual activity: Not on file  Other Topics Concern  . Not on file  Social History Narrative  . Not on file   Social Determinants of Health   Financial Resource Strain: Not on file  Food Insecurity: Not on file  Transportation Needs: Not on file  Physical Activity: Not on file  Stress: Not on file   Social Connections: Not on file   Past Surgical History:  Procedure Laterality Date  . ABDOMINAL HYSTERECTOMY    . CESAREAN SECTION    . KNEE SURGERY Right    x2  . UPPER GASTROINTESTINAL ENDOSCOPY     Past Surgical History:  Procedure Laterality Date  . ABDOMINAL HYSTERECTOMY    . CESAREAN SECTION    . KNEE SURGERY Right    x2  . UPPER GASTROINTESTINAL ENDOSCOPY     Past Medical History:  Diagnosis Date  . Allergy   . Anxiety   . Arthritis   . Asthma   . Blood transfusion without reported diagnosis   . Depression   . GERD (gastroesophageal reflux disease)   . High cholesterol   . Hypertension   . Obesity   . Osteoporosis   . Recurrent boils   . Seizures ([place])    last seizure "3 years ago" per pt 06-16-16 KBW  . Sleep apnea    no CPAP used  . Uterine fibroid    There were no vitals taken for this visit.  Opioid Risk Score:   Fall Risk Score:  `1  Depression screen PHQ 2/9  Depression screen [facility] 2/9 03/18/2020 03/08/2017 01/11/2017 12/12/2016  Decreased Interest 1 0 0 1  Down, Depressed, Hopeless 1 0 0 1  PHQ - 2 Score 2 0 0 2  Altered sleeping 2 - - 1  Tired, decreased energy 1 - - 1  Change in appetite 1 - - 1  Feeling bad or failure about yourself  0 - - 1  Trouble concentrating 0 - - 1  Moving slowly or fidgety/restless 0 - - 1  Suicidal thoughts 0 - - 1  PHQ-9 Score 6 - - 9    Review of Systems  Musculoskeletal: Positive for back pain.       Knee pain Hip pain  All other systems reviewed and are negative.     Objective:   Physical Exam Gen: no distress, normal appearing HEENT: oral mucosa pink and moist, NCAT Cardio: Reg rate Chest: normal effort, normal rate of breathing Abd: soft, non-distended Ext: no edema Skin: intact Musculoskeletal: Tenderness to palpation over lumbar spine diffusely. Impaired flexion and extension. 4/5 strength in right sided elbow flexion, strength is otherwise 5/5.  Psych: pleasant, normal affect    Assessment  & Plan:  Heather Perry is a 55 year old woman who presents for follow-up of low back pain and cervical stenosis.   1) Low back pain -MRI reviewed with patient as follows:  IMPRESSION: T11-12: Advanced bilateral facet arthropathy with 3 mm of anterolisthesis. Bulging of the disc. No visible compressive stenosis. This level was not completely evaluated in the axial plane.  L3-4: 2 mm retrolisthesis. Mild bulging of the disc. Mild facet arthritis. No stenosis.  L4-5: Advanced bilateral facet arthropathy likely to be  symptomatic. Edema in joint effusions. Anterolisthesis of 3 mm. Shallow broad-based herniation of the disc. Stenosis of the lateral recesses and neural foramina that could cause neural compression on either or both sides. Finding slightly worse on the left. Certainly, the facet arthropathy could be a cause of low back pain.  L5-S1: Chronic disc degeneration with endplate osteophytes and bulging of the disc. Facet osteoarthritis bilaterally. No compressive central canal stenosis. Bilateral foraminal stenosis that would have some potential to affect the exiting L5 nerves. The degenerative disc disease and degenerative facet disease could certainly contribute to low back pain.  -She may be a good candidate for ESI if interested. -She has not benefited from trigger point injections in the past. -Her pharmacist educated her that Dilantin inhibits her absorption of Oxycodone which is why she may require higher doses. Discussed that we have to taper up slowly to avoid risks of respiratory depression, congnitive dysfunction, and constipation. Prescribed 15mg  TID prn and discussed that she should try to use two whenever possible as the more she uses the more she will become tolerant to the medication. . -Refilled Gabapentin.  -I have prescribed Tizanidine for muscle spasm.  Repeat LFT level next visit. Reviewed and normal when last checked, but that was a while ago.   2) Right  knee pain: -The Monovisc did provide relief. Pain is much better, but she still has buckling. Can repeat 6 months out if needed.  -Prescribed celebrex. Advised regarding increased risk of stroke and bleed while on this medication and advised to use sparingly.   3) Vitamin D at the bottom of normal range (31.5- normal range is 30-100). -Supplement with 50,000 units x7 weeks and repeat level afterward.   4) HTN: amlodipine prescribed  5) Allergies: Flonase prescribed.   6) Lack of PCP: Provided referral to [facility].   7) Cervical spine pain with radiation into right arm and elbow flexion weakness: Cervical MRI ordered to assess for nerve compression given >6 months of symptoms failing conservative care.

## 2020-07-23 NOTE — Telephone Encounter (Signed)
Heather Perry with [employer] called with case # [id number], and call [phone] Opt 5 to do clinical approval for medication.

## 2020-07-23 NOTE — Telephone Encounter (Signed)
Her pharmacy notified her she needs a authorization for medication.

## 2020-07-27 ENCOUNTER — Other Ambulatory Visit: Payer: Self-pay | Admitting: Physical Medicine and Rehabilitation

## 2020-07-28 ENCOUNTER — Encounter: Payer: Self-pay | Admitting: *Deleted

## 2020-07-28 DIAGNOSIS — M5136 Other intervertebral disc degeneration, lumbar region: Secondary | ICD-10-CM | POA: Insufficient documentation

## 2020-08-06 LAB — TOXASSURE SELECT,+ANTIDEPR,UR

## 2020-08-11 ENCOUNTER — Telehealth: Payer: Self-pay | Admitting: *Deleted

## 2020-08-11 NOTE — Telephone Encounter (Signed)
Urine drug screen for this encounter is consistent for prescribed medication 

## 2020-08-17 ENCOUNTER — Other Ambulatory Visit: Payer: Self-pay

## 2020-08-17 ENCOUNTER — Ambulatory Visit (HOSPITAL_COMMUNITY)
Admission: RE | Admit: 2020-08-17 | Discharge: 2020-08-17 | Disposition: A | Discharge status: Home / Self Care | Payer: Medicaid Other | Source: Ambulatory Visit | Attending: Physical Medicine and Rehabilitation | Admitting: Physical Medicine and Rehabilitation

## 2020-08-17 DIAGNOSIS — R52 Pain, unspecified: Secondary | ICD-10-CM | POA: Insufficient documentation

## 2020-08-19 ENCOUNTER — Ambulatory Visit (HOSPITAL_COMMUNITY)
Admission: EM | Admit: 2020-08-19 | Discharge: 2020-08-19 | Disposition: A | Discharge status: Home / Self Care | Payer: Medicaid Other | Attending: Family Medicine | Admitting: Family Medicine

## 2020-08-19 ENCOUNTER — Other Ambulatory Visit: Payer: Self-pay

## 2020-08-19 ENCOUNTER — Encounter (HOSPITAL_COMMUNITY): Payer: Self-pay

## 2020-08-19 DIAGNOSIS — R350 Frequency of micturition: Secondary | ICD-10-CM | POA: Diagnosis not present

## 2020-08-19 DIAGNOSIS — R21 Rash and other nonspecific skin eruption: Secondary | ICD-10-CM | POA: Insufficient documentation

## 2020-08-19 DIAGNOSIS — N898 Other specified noninflammatory disorders of vagina: Secondary | ICD-10-CM | POA: Diagnosis not present

## 2020-08-19 LAB — POCT URINALYSIS DIPSTICK, ED / UC
Bilirubin Urine: NEGATIVE
Glucose, UA: NEGATIVE mg/dL
Hgb urine dipstick: NEGATIVE
Ketones, ur: NEGATIVE mg/dL
Leukocytes,Ua: NEGATIVE
Nitrite: NEGATIVE
Protein, ur: NEGATIVE mg/dL
Specific Gravity, Urine: >=1.03 (ref 1.005–1.030)
Urobilinogen, UA: 0.2 mg/dL (ref 0.0–1.0)
pH: 5.5 (ref 5.0–8.0)

## 2020-08-19 MED ORDER — HYDROXYZINE HCL 25 MG PO TABS: 25.0000 mg | ORAL_TABLET | Freq: Four times a day (QID) | ORAL | 0 refills | Status: DC | PRN

## 2020-08-19 NOTE — ED Provider Notes (Signed)
[facility]    CSN: [id number] Arrival date & time: 08/19/20  1138      History   Chief Complaint Chief Complaint  Patient presents with  . Foot irritation  . Urinary Frequency  . Vaginal Itching    HPI Heather Perry is a 54 y.o. female.   Patient presents with 1 day history of right foot itching with rash.  She states she has been "up all night scratching" her foot.  She also reports urinary frequency.  She also reports vaginal itching.  She denies fever, chills, abdominal pain, vaginal discharge, pelvic pain, or other symptoms.  Treatment of her foot itching attempted at home with triamcinolone cream and Benadryl.  Her medical history includes hypertension,, lumbar DDD, anxiety, depression, chronic pain syndrome.  The history is provided by the patient and medical records.    Past Medical History:  Diagnosis Date  . Allergy   . Anxiety   . Arthritis   . Asthma   . Blood transfusion without reported diagnosis   . Depression   . GERD (gastroesophageal reflux disease)   . High cholesterol   . Hypertension   . Obesity   . Osteoporosis   . Recurrent boils   . Seizures ([place])    last seizure "3 years ago" per pt 06-16-16 KBW  . Sleep apnea    no CPAP used  . Uterine fibroid     Patient Active Problem List   Diagnosis Date Noted  . Lumbar degenerative disc disease 07/28/2020  . Anxiety disorder 06/28/2017  . MDD (major depressive disorder), recurrent episode, mild ([place]) 06/28/2017  . Essential hypertension 06/26/2017  . Hyperlipidemia with target LDL less than 100 06/26/2017  . Chest pain with low risk for cardiac etiology 06/26/2017  . 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 Rash and nonspecific skin eruption 04/13/2017    Past Surgical History:  Procedure Laterality Date  . ABDOMINAL HYSTERECTOMY    . CESAREAN SECTION    . KNEE SURGERY Right    x2  . UPPER GASTROINTESTINAL ENDOSCOPY      OB History    No obstetric history on file.      Home Medications    Prior to Admission medications   Medication Sig Start Date End Date Taking? Authorizing Provider  hydrOXYzine (ATARAX/VISTARIL) 25 MG tablet Take 1 tablet (25 mg total) by mouth every 6 (six) hours as needed for itching. 08/19/20  Yes Sharion Balloon, NP  acyclovir ointment (ZOVIRAX) 5 % Apply 1 application topically every 3 (three) hours. 05/18/16   Micheline Chapman, NP  albuterol (PROVENTIL HFA;VENTOLIN HFA) 108 (90 Base) MCG/ACT inhaler Inhale 2 puffs into the lungs every 6 (six) hours as needed for wheezing or shortness of breath. 05/18/16   Micheline Chapman, NP  AMITIZA 24 MCG capsule Take 1 capsule (24 mcg total) by mouth 2 (two) times daily with a meal. 06/09/20   Raulkar, Clide Deutscher, MD  amLODipine (NORVASC) 5 MG tablet TAKE 1 TABLET (5 MG TOTAL) BY MOUTH DAILY. 07/09/20   Raulkar, Clide Deutscher, MD  aspirin EC 81 MG tablet Take 81 mg by mouth daily.    [provider]  benazepril-hydrochlorthiazide (LOTENSIN HCT) 20-12.5 MG tablet Take 1 tablet by mouth daily. 06/09/20   Raulkar, Clide Deutscher, MD  budesonide-formoterol (SYMBICORT) 160-4.5 MCG/ACT inhaler Inhale 2 puffs into the lungs 2 (two) times daily. Patient taking differently: Inhale 2 puffs into the lungs 2 (two) times daily as  needed (wheezing, sob). 05/18/16   Micheline Chapman, NP  celecoxib (CELEBREX) 200 MG capsule TAKE 1 CAPSULE (200 MG TOTAL) BY MOUTH 2 (TWO) TIMES DAILY. 07/09/20   Raulkar, Clide Deutscher, MD  cetirizine (ZYRTEC) 10 MG tablet Take 1 tablet (10 mg total) by mouth daily. 04/29/20   Raulkar, Clide Deutscher, MD  cyclobenzaprine (FLEXERIL) 10 MG tablet Take 1 tablet (10 mg total) by mouth 3 (three) times daily as needed for muscle spasms. 07/08/20   Raulkar, Clide Deutscher, MD  ergocalciferol (VITAMIN D2) 1.25 MG (50000 UT) capsule Take 1 capsule (50,000 Units total) by mouth once a week. 03/19/20   Raulkar, Clide Deutscher, MD  esomeprazole (NEXIUM) 40 MG capsule TAKE ONE  CAPSULE BY MOUTH ONCE DAILY 07/27/20   Raulkar, Clide Deutscher, MD  famotidine (PEPCID) 20 MG tablet Take 1 tablet (20 mg total) by mouth 2 (two) times daily. 04/20/17   Khatri, Hina, PA-C  fluticasone (FLONASE) 50 MCG/ACT nasal spray PLACE 2 SPRAYS INTO BOTH NOSTRILS DAILY. 07/09/20   Raulkar, Clide Deutscher, MD  gabapentin (NEURONTIN) 300 MG capsule Take 1 capsule (300 mg total) by mouth 3 (three) times daily. 07/23/20   Raulkar, Clide Deutscher, MD  hydrochlorothiazide (HYDRODIURIL) 25 MG tablet Take 1 tablet (25 mg total) by mouth daily. 05/18/16   Micheline Chapman, NP  hydroxychloroquine (PLAQUENIL) 200 MG tablet Take 1 tablet by mouth 2 (two) times daily.  03/21/17   [provider]  ipratropium-albuterol (DUONEB) 0.5-2.5 (3) MG/3ML SOLN Take 3 mLs by nebulization every 6 (six) hours as needed. Patient taking differently: Take 3 mLs by nebulization every 6 (six) hours as needed (for SOB). 05/18/16   Micheline Chapman, NP  leflunomide (ARAVA) 20 MG tablet Take by mouth. 11/11/19   [provider]  Misc. Devices (STEP N REST II WALKER) MISC 1 each by Does not apply route daily. 03/08/17   Clent Demark, PA-C  mupirocin cream (BACTROBAN) 2 % Apply 1 application topically 2 (two) times daily. To breast 04/13/17   Steve Rattler, DO  olmesartan-hydrochlorothiazide (BENICAR HCT) 40-25 MG tablet Take 1 tablet by mouth daily. 03/10/20   [provider]  oxyCODONE (ROXICODONE) 15 MG immediate release tablet Take 1 tablet (15 mg total) by mouth 3 (three) times daily as needed for pain. 07/23/20   Raulkar, Clide Deutscher, MD  phenytoin (DILANTIN) 100 MG ER capsule Take 1 capsule (100 mg total) by mouth 3 (three) times daily. 05/18/16   Micheline Chapman, NP  phenytoin (DILANTIN) 100 MG ER capsule Take by mouth. 02/10/20   [provider]  pravastatin (PRAVACHOL) 20 MG tablet Take 1 tablet (20 mg total) by mouth daily. 05/18/16   Micheline Chapman, NP  predniSONE (STERAPRED UNI-PAK 21 TAB) 10  MG (21) TBPK tablet Take by mouth daily. Take as directed. 07/02/19   Vanessa Kick, MD  QUEtiapine (SEROQUEL) 100 MG tablet Take 100 mg by mouth at bedtime.  04/21/18   [provider]  tiZANidine (ZANAFLEX) 4 MG tablet TAKE 1 TABLET (4 MG TOTAL) BY MOUTH 3 (THREE) TIMES DAILY. 06/01/20 06/01/21  Izora Ribas, MD  triamcinolone cream (KENALOG) 0.1 % Apply topically 2 (two) times daily. 05/23/17   Clent Demark, PA-C  Vitamin D, Ergocalciferol, (DRISDOL) 1.25 MG (50000 UNIT) CAPS capsule Take 1 capsule (50,000 Units total) by mouth every 7 (seven) days. 04/16/20   Raulkar, Clide Deutscher, MD    Family History Family History  Problem Relation Age of Onset  .  Breast cancer Maternal Aunt        unsure how old of onset  . Breast cancer Maternal Aunt        unsure how old of onset  . Colon cancer Neg Hx   . Esophageal cancer Neg Hx   . Rectal cancer Neg Hx   . Stomach cancer Neg Hx     Social History Social History   Tobacco Use  . Smoking status: Never Smoker  . Smokeless tobacco: Never Used  Vaping Use  . Vaping Use: Never used  Substance Use Topics  . Alcohol use: No  . Drug use: No    Types: "Crack" cocaine    Comment: last smoked 2011     Allergies   Hydrocodone-acetaminophen, Lortab [hydrocodone-acetaminophen], and Penicillins   Review of Systems Review of Systems  Constitutional: Negative for chills and fever.  HENT: Negative for ear pain and sore throat.   Eyes: Negative for pain and visual disturbance.  Respiratory: Negative for cough and shortness of breath.   Cardiovascular: Negative for chest pain and palpitations.  Gastrointestinal: Negative for abdominal pain and vomiting.  Genitourinary: Positive for frequency. Negative for dysuria, hematuria, pelvic pain and vaginal discharge.       Vaginal itching  Musculoskeletal: Negative for arthralgias and back pain.  Skin: Positive for rash. Negative for color change.  Neurological: Negative for  seizures and syncope.  All other systems reviewed and are negative.    Physical Exam Triage Vital Signs ED Triage Vitals  Enc Vitals Group     BP 08/19/20 1208 (!) 146/74     Pulse Rate 08/19/20 1208 98     Resp 08/19/20 1208 20     Temp 08/19/20 1208 (!) 97.4 F (36.3 C)     Temp src --      SpO2 08/19/20 1208 97 %     Weight --      Height --      Head Circumference --      Peak Flow --      Pain Score 08/19/20 1207 0     Pain Loc --      Pain Edu? --      Excl. in [place]? --    No data found.  Updated Vital Signs BP (!) 146/74   Pulse 98   Temp (!) 97.4 F (36.3 C)   Resp 20   SpO2 97%   Visual Acuity Right Eye Distance:   Left Eye Distance:   Bilateral Distance:    Right Eye Near:   Left Eye Near:    Bilateral Near:     Physical Exam Vitals and nursing note reviewed.  Constitutional:      General: She is not in acute distress.    Appearance: She is well-developed and well-nourished. She is obese. She is not ill-appearing.  HENT:     Head: Normocephalic and atraumatic.     Mouth/Throat:     Mouth: Mucous membranes are moist.  Eyes:     Conjunctiva/sclera: Conjunctivae normal.  Cardiovascular:     Rate and Rhythm: Normal rate and regular rhythm.     Heart sounds: Normal heart sounds.  Pulmonary:     Effort: Pulmonary effort is normal. No respiratory distress.     Breath sounds: Normal breath sounds.  Abdominal:     Palpations: Abdomen is soft.     Tenderness: There is no abdominal tenderness. There is no right CVA tenderness, left CVA tenderness, guarding or rebound.  Musculoskeletal:  General: No swelling, tenderness, deformity or edema. Normal range of motion.     Cervical back: Neck supple.       Feet:  Feet:     Comments: Faint pink maculopapular rash on right foot. See diagram.  Skin:    General: Skin is warm and dry.     Findings: Rash present. No bruising or lesion.  Neurological:     General: No focal deficit present.     Mental  Status: She is alert and oriented to person, place, and time.     Gait: Gait normal.  Psychiatric:        Mood and Affect: Mood and affect and mood normal.        Behavior: Behavior normal.      UC Treatments / Results  Labs (all labs ordered are listed, but only abnormal results are displayed) Labs Reviewed  POCT URINALYSIS DIPSTICK, ED / UC  CERVICOVAGINAL ANCILLARY ONLY    EKG   Radiology No results found.  Procedures Procedures (including critical care time)  Medications Ordered in UC Medications - No data to display  Initial Impression / Assessment and Plan / UC Course  I have reviewed the triage vital signs and the nursing notes.  Pertinent labs & imaging results that were available during my care of the patient were reviewed by me and considered in my medical decision making (see chart for details).   Rash of right foot.  Vaginal itching.  Urinary frequency.  Instructed patient to continue using the triamcinolone cream on her foot rash.  Hydroxyzine as needed for itching.  Precautions for drowsiness with hydroxyzine discussed.  Patient obtained vaginal self swab for testing.  Urine normal.  Instructed her to follow-up with her PCP if her symptoms are not improving.  Patient agrees to plan of care.   Final Clinical Impressions(s) / UC Diagnoses   Final diagnoses:  Rash of foot  Vaginal itching  Urinary frequency     Discharge Instructions     Take the hydroxyzine as needed for itching.  Do not drive, operate machinery, or drink alcohol with this medication as it may cause drowsiness.    Your vaginal tests are pending.  If your test results are positive, we will call you.  You and your sexual partner(s) may require treatment at that time.  Do not have sexual activity for at least 7 days.    Follow up with your primary care provider.           ED Prescriptions    Medication Sig Dispense Auth. Provider   hydrOXYzine (ATARAX/VISTARIL) 25 MG tablet Take  1 tablet (25 mg total) by mouth every 6 (six) hours as needed for itching. 12 tablet Sharion Balloon, NP     PDMP not reviewed this encounter.   Sharion Balloon, NP 08/19/20 1310

## 2020-08-19 NOTE — Discharge Instructions (Signed)
Take the hydroxyzine as needed for itching.  Do not drive, operate machinery, or drink alcohol with this medication as it may cause drowsiness.    Your vaginal tests are pending.  If your test results are positive, we will call you.  You and your sexual partner(s) may require treatment at that time.  Do not have sexual activity for at least 7 days.    Follow up with your primary care provider.

## 2020-08-19 NOTE — ED Triage Notes (Addendum)
Pt in with c/o right foot itching and irritation that started yesterday  Pt has tried benadryl and topical cream with no relief  Pt also c/o vaginal itching and urinary frequency that has been going on for about 3 days

## 2020-08-20 ENCOUNTER — Encounter
Payer: Medicaid Other | Attending: Physical Medicine and Rehabilitation | Admitting: Physical Medicine and Rehabilitation

## 2020-08-20 ENCOUNTER — Other Ambulatory Visit: Payer: Self-pay

## 2020-08-20 ENCOUNTER — Encounter: Payer: Self-pay | Admitting: Physical Medicine and Rehabilitation

## 2020-08-20 VITALS — BP 125/76 | HR 101 | Temp 98.2°F | Ht 62.0 in | Wt 231.0 lb

## 2020-08-20 DIAGNOSIS — M5416 Radiculopathy, lumbar region: Secondary | ICD-10-CM | POA: Diagnosis present

## 2020-08-20 DIAGNOSIS — Z79891 Long term (current) use of opiate analgesic: Secondary | ICD-10-CM | POA: Insufficient documentation

## 2020-08-20 DIAGNOSIS — G894 Chronic pain syndrome: Secondary | ICD-10-CM | POA: Insufficient documentation

## 2020-08-20 DIAGNOSIS — J45909 Unspecified asthma, uncomplicated: Secondary | ICD-10-CM | POA: Insufficient documentation

## 2020-08-20 DIAGNOSIS — M5412 Radiculopathy, cervical region: Secondary | ICD-10-CM | POA: Diagnosis present

## 2020-08-20 DIAGNOSIS — M5136 Other intervertebral disc degeneration, lumbar region: Secondary | ICD-10-CM | POA: Diagnosis present

## 2020-08-20 DIAGNOSIS — Z5181 Encounter for therapeutic drug level monitoring: Secondary | ICD-10-CM | POA: Diagnosis present

## 2020-08-20 DIAGNOSIS — M542 Cervicalgia: Secondary | ICD-10-CM | POA: Diagnosis present

## 2020-08-20 DIAGNOSIS — R52 Pain, unspecified: Secondary | ICD-10-CM | POA: Diagnosis present

## 2020-08-20 LAB — CERVICOVAGINAL ANCILLARY ONLY
Bacterial Vaginitis (gardnerella): NEGATIVE
Candida Glabrata: NEGATIVE
Candida Vaginitis: NEGATIVE
Chlamydia: NEGATIVE
Comment: NEGATIVE
Comment: NEGATIVE
Comment: NEGATIVE
Comment: NEGATIVE
Comment: NEGATIVE
Comment: NORMAL
Neisseria Gonorrhea: NEGATIVE
Trichomonas: NEGATIVE

## 2020-08-20 MED ORDER — AMITIZA 24 MCG PO CAPS
24.0000 ug | ORAL_CAPSULE | Freq: Two times a day (BID) | ORAL | 1 refills | Status: DC
Start: 2020-08-20 — End: 2020-09-18

## 2020-08-20 MED ORDER — DICLOFENAC SODIUM 1 % EX GEL: 2.0000 g | Freq: Four times a day (QID) | CUTANEOUS | 3 refills | Status: DC

## 2020-08-20 MED ORDER — CYCLOBENZAPRINE HCL 10 MG PO TABS
10.0000 mg | ORAL_TABLET | Freq: Three times a day (TID) | ORAL | 1 refills | Status: DC | PRN
Start: 2020-08-20 — End: 2020-09-18

## 2020-08-20 MED ORDER — OXYCODONE HCL 15 MG PO TABS: 15.0000 mg | ORAL_TABLET | Freq: Three times a day (TID) | ORAL | 0 refills | Status: DC | PRN

## 2020-08-20 MED ORDER — GABAPENTIN 300 MG PO CAPS
300.0000 mg | ORAL_CAPSULE | Freq: Three times a day (TID) | ORAL | 1 refills | Status: DC
Start: 2020-08-20 — End: 2020-09-18

## 2020-08-20 NOTE — Patient Instructions (Signed)
-  Discussed following foods that may reduce pain: 1) Ginger 2) Blueberries 3) Salmon 4) Pumpkin seeds 5) dark chocolate 6) turmeric 7) tart cherries 8) virgin olive oil 9) chilli peppers 10) mint 11) red wine 12) garlic  Link to further information on diet for chronic pain: http://www.randall.com/   -Discussed foods that can assist in weight loss:  1) Eggs  2) Leafy greens  3) Salmon  4) Cruciferous vegetables  5) Lean beef and chicken breast  6) Boiled potatoes  7) Tuna  8) Beans and legumes  9) Soups  10) Cottage cheese  11) Avocados  12) Apple cider vinegar  13) Nuts  14) Whole grains  15) Chili pepper  16) Fruit- berries are some of the best  17) Grapefruit  18) Chia seeds  19) Coconut oil  20) Full-fat yogurt

## 2020-08-20 NOTE — Progress Notes (Signed)
Subjective:    Patient ID: Heather Perry, female    DOB: 02/06/1966, 55 y.o.   MRN: [id number]  HPI  Mrs. Gravois presents for her follow-up of her facet arthropathy and L3-L4 retrolisthesis, and cervical spinal stenosis.  1) Lumbar facet arthrosis:  MRI lumbar spine from  2018 reviewed: IMPRESSION: T11-12: Advanced bilateral facet arthropathy with 3 mm of anterolisthesis. Bulging of the disc. No visible compressive stenosis. This level was not completely evaluated in the axial plane.  L3-4: 2 mm retrolisthesis. Mild bulging of the disc. Mild facet arthritis. No stenosis.  L4-5: Advanced bilateral facet arthropathy likely to be symptomatic. Edema in joint effusions. Anterolisthesis of 3 mm. Shallow broad-based herniation of the disc. Stenosis of the lateral recesses and neural foramina that could cause neural compression on either or both sides. Finding slightly worse on the left. Certainly, the facet arthropathy could be a cause of low back pain.  L5-S1: Chronic disc degeneration with endplate osteophytes and bulging of the disc. Facet osteoarthritis bilaterally. No compressive central canal stenosis. Bilateral foraminal stenosis that would have some potential to affect the exiting L5 nerves. The degenerative disc disease and degenerative facet disease could certainly contribute to low back pain.  Pain is much better controlled today.   She was told by her pharmacist that her Dilantin can interfere with oxycodone absorption and that is why she may require higher doses for pain relief.   Urine sample with expected metabolites.   -She has been able to walk 1 mile twice per week!   2) Right knee pain:  -She would also like to try Celebrex to help with her knee arthritis.  -Viscosupplementation has helped alleviate her pain, though she continues to have buckling at times.   3) HTN Her blood pressure is elevated. Her insurance did not approve her benzapril, so I have  transitioned to amlodipine.   4) Allergies: She is having some allergies and requests flonase.   5) General health She currently has no PCP. I have provided internal medicine referral today.   6) Cervical spinal stenosis She has been worsening symptoms of pain radiating from her neck into her right arm.  -Cervical MRI reviewed with patient and treatment options discussed -Pain primarily radiates down ulnar side of arm and into volar aspect of palm, not into fingers.  -Average pain has reduced to 8, and current to 2.   Pain Inventory Average Pain 8 Pain Right Now 2 My pain is sharp, stabbing and aching  In the last 24 hours, has pain interfered with the following? General activity 4 Relation with others 0 Enjoyment of life 2 What TIME of day is your pain at its worst? morning , daytime, evening and night Sleep (in general) Fair  Pain is worse with: walking, bending, sitting, standing and some activites Pain improves with: rest and medication Relief from Meds: 9  Family History  Problem Relation Age of Onset  . Breast cancer Maternal Aunt        unsure how old of onset  . Breast cancer Maternal Aunt        unsure how old of onset  . Colon cancer Neg Hx   . Esophageal cancer Neg Hx   . Rectal cancer Neg Hx   . Stomach cancer Neg Hx    Social History   Socioeconomic History  . Marital status: Single    Spouse name: Not on file  . Number of children: Not on file  . Years of education: Not  on file  . Highest education level: Not on file  Occupational History  . Not on file  Tobacco Use  . Smoking status: Never Smoker  . Smokeless tobacco: Never Used  Vaping Use  . Vaping Use: Never used  Substance and Sexual Activity  . Alcohol use: No  . Drug use: No    Types: "Crack" cocaine    Comment: last smoked 2011  . Sexual activity: Not on file  Other Topics Concern  . Not on file  Social History Narrative  . Not on file   Social Determinants of Health    Financial Resource Strain: Not on file  Food Insecurity: Not on file  Transportation Needs: Not on file  Physical Activity: Not on file  Stress: Not on file  Social Connections: Not on file   Past Surgical History:  Procedure Laterality Date  . ABDOMINAL HYSTERECTOMY    . CESAREAN SECTION    . KNEE SURGERY Right    x2  . UPPER GASTROINTESTINAL ENDOSCOPY     Past Surgical History:  Procedure Laterality Date  . ABDOMINAL HYSTERECTOMY    . CESAREAN SECTION    . KNEE SURGERY Right    x2  . UPPER GASTROINTESTINAL ENDOSCOPY     Past Medical History:  Diagnosis Date  . Allergy   . Anxiety   . Arthritis   . Asthma   . Blood transfusion without reported diagnosis   . Depression   . GERD (gastroesophageal reflux disease)   . High cholesterol   . Hypertension   . Obesity   . Osteoporosis   . Recurrent boils   . Seizures ([place])    last seizure "3 years ago" per pt 06-16-16 KBW  . Sleep apnea    no CPAP used  . Uterine fibroid    BP 125/76   Pulse (!) 101   Temp 98.2 F (36.8 C)   Ht 5\' 2"  (1.575 m)   Wt 231 lb (104.8 kg)   SpO2 92%   BMI 42.25 kg/m   Opioid Risk Score:   Fall Risk Score:  `1  Depression screen PHQ 2/9  Depression screen [facility] 2/9 03/18/2020 03/08/2017 01/11/2017 12/12/2016  Decreased Interest 1 0 0 1  Down, Depressed, Hopeless 1 0 0 1  PHQ - 2 Score 2 0 0 2  Altered sleeping 2 - - 1  Tired, decreased energy 1 - - 1  Change in appetite 1 - - 1  Feeling bad or failure about yourself  0 - - 1  Trouble concentrating 0 - - 1  Moving slowly or fidgety/restless 0 - - 1  Suicidal thoughts 0 - - 1  PHQ-9 Score 6 - - 9    Review of Systems  Constitutional: Negative.   HENT: Negative.   Eyes: Negative.   Respiratory: Negative.   Cardiovascular: Negative.   Gastrointestinal: Negative.   Endocrine: Negative.   Genitourinary: Negative.   Musculoskeletal: Positive for arthralgias and back pain.       Knee pain Hip pain  Skin: Negative.    Allergic/Immunologic: Negative.   Neurological: Negative.   Hematological: Negative.   Psychiatric/Behavioral: Negative.   All other systems reviewed and are negative.     Objective:   Physical Exam Gen: no distress, normal appearing HEENT: oral mucosa pink and moist, NCAT Cardio: Reg rate Chest: normal effort, normal rate of breathing Abd: soft, non-distended Ext: no edema Psych: pleasant, normal affect Skin: intact Musculoskeletal: Tenderness to palpation over lumbar spine diffusely. Impaired  flexion and extension. 4/5 strength in right sided elbow flexion, strength is otherwise 5/5. +Spurling's test right side.  Psych: pleasant, normal affect    Assessment & Plan:  Mrs. Radford is a 55 year old woman who presents for follow-up of lumbar facet arthropathy and cervical stenosis.   1) Low back pain -MRI reviewed with patient as follows:  IMPRESSION: T11-12: Advanced bilateral facet arthropathy with 3 mm of anterolisthesis. Bulging of the disc. No visible compressive stenosis. This level was not completely evaluated in the axial plane.  L3-4: 2 mm retrolisthesis. Mild bulging of the disc. Mild facet arthritis. No stenosis.  L4-5: Advanced bilateral facet arthropathy likely to be symptomatic. Edema in joint effusions. Anterolisthesis of 3 mm. Shallow broad-based herniation of the disc. Stenosis of the lateral recesses and neural foramina that could cause neural compression on either or both sides. Finding slightly worse on the left. Certainly, the facet arthropathy could be a cause of low back pain.  L5-S1: Chronic disc degeneration with endplate osteophytes and bulging of the disc. Facet osteoarthritis bilaterally. No compressive central canal stenosis. Bilateral foraminal stenosis that would have some potential to affect the exiting L5 nerves. The degenerative disc disease and degenerative facet disease could certainly contribute to low back pain.  -She may be a  good candidate for ESI if interested. -Much better controlled! -She has not benefited from trigger point injections in the past. -Her pharmacist educated her that Dilantin inhibits her absorption of Oxycodone which is why she may require higher doses. Discussed that we have to taper up slowly to avoid risks of respiratory depression, congnitive dysfunction, and constipation. Prescribed 15mg  TID prn and discussed that she should try to use two whenever possible as the more she uses the more she will become tolerant to the medication. . -Continue Gabapentin.  -Continue Flexeril.   -Made goal to increase walking to 1 mi three times per week.   2) Right knee pain: -The Monovisc did provide relief. Pain is much better, but she still has buckling. Can repeat 6 months out if needed.  -Prescribed celebrex. Advised regarding increased risk of stroke and bleed while on this medication and advised to use sparingly.   3) Vitamin D at the bottom of normal range (31.5- normal range is 30-100). -Supplement with 50,000 units x7 weeks and repeat level afterward.   4) HTN: amlodipine prescribed. Under excellent control today.   5) Allergies: Flonase prescribed.   6) Lack of PCP: Provided referral to internal medicine.   7) Cervical spine pain with radiation into right arm and elbow flexion weakness: Cervical MRI ordered to assess for nerve compression given >6 months of symptoms failing conservative care. Reviewed results with her today- diffuse cervical stenosis. Discussed PT for postural correction and strengthening of neck and upper back. She prefers to do this on her own for now.  -Discussed current symptoms of pain and history of pain.  -Discussed benefits of exercise in reducing pain. -Discussed following foods that may reduce pain: 1) Ginger 2) Blueberries 3) Salmon 4) Pumpkin seeds 5) dark chocolate 6) turmeric 7) tart cherries 8) virgin olive oil 9) chilli peppers 10) mint 11)  garlic  Link to further information on diet for chronic pain: [email or web address]  8) Obesity: -Lost 3 lbs in past 4 months! -Discussed foods that can assist in weight loss:  1) Eggs  2) Leafy greens  3) Salmon  4) Cruciferous vegetables  5) Lean beef and chicken breast  6) Boiled potatoes  7) [place]  8) Beans and legumes  9) Soups  10) Cottage cheese  11) Avocados  12) Apple cider vinegar  13) Nuts  14) Whole grains  15) Chili pepper  16) Fruit- berries are some of the best  17) Grapefruit  18) Chia seeds  19) Coconut oil  20) Full-fat yogurt

## 2020-08-25 ENCOUNTER — Ambulatory Visit: Payer: Medicaid Other

## 2020-09-16 ENCOUNTER — Other Ambulatory Visit: Payer: Self-pay | Admitting: Physical Medicine and Rehabilitation

## 2020-09-17 ENCOUNTER — Other Ambulatory Visit: Payer: Self-pay

## 2020-09-17 ENCOUNTER — Encounter: Payer: Self-pay | Admitting: Registered Nurse

## 2020-09-17 ENCOUNTER — Encounter: Payer: Medicaid Other | Admitting: Registered Nurse

## 2020-09-17 ENCOUNTER — Other Ambulatory Visit: Payer: Self-pay | Admitting: Physical Medicine and Rehabilitation

## 2020-09-17 VITALS — BP 137/83 | HR 100 | Temp 98.2°F | Ht 62.0 in | Wt 236.6 lb

## 2020-09-17 DIAGNOSIS — Z79891 Long term (current) use of opiate analgesic: Secondary | ICD-10-CM

## 2020-09-17 DIAGNOSIS — M5412 Radiculopathy, cervical region: Secondary | ICD-10-CM | POA: Diagnosis not present

## 2020-09-17 DIAGNOSIS — M5416 Radiculopathy, lumbar region: Secondary | ICD-10-CM

## 2020-09-17 DIAGNOSIS — M5136 Other intervertebral disc degeneration, lumbar region: Secondary | ICD-10-CM | POA: Diagnosis not present

## 2020-09-17 DIAGNOSIS — M542 Cervicalgia: Secondary | ICD-10-CM | POA: Diagnosis not present

## 2020-09-17 DIAGNOSIS — G894 Chronic pain syndrome: Secondary | ICD-10-CM

## 2020-09-17 DIAGNOSIS — M51369 Other intervertebral disc degeneration, lumbar region without mention of lumbar back pain or lower extremity pain: Secondary | ICD-10-CM

## 2020-09-17 DIAGNOSIS — Z5181 Encounter for therapeutic drug level monitoring: Secondary | ICD-10-CM

## 2020-09-17 MED ORDER — OXYCODONE HCL 15 MG PO TABS: 15.0000 mg | ORAL_TABLET | Freq: Three times a day (TID) | ORAL | 0 refills | Status: DC | PRN

## 2020-09-17 NOTE — Progress Notes (Signed)
Subjective:    Patient ID: Heather Perry, female    DOB: [birth year], 55 y.o.   MRN: [id number]  HPI: Heather Perry is a 55 y.o. female who returns for follow up appointment for chronic pain and medication refill. She states her pain is located in her neck radiating into her right shoulder and right arm with tingling burning pain. Also reports she has lower back pain radiating into her right lower extremity. She rates her pain 1. Her current exercise regime is walking and performing stretching exercises.  [name] didn't bring her medication bottle with her, she reports she has no medication. According to the PMP her Oxycodone was filled on 08/20/2020, she states she had a few days when she was experiencing increase intensity of pain and she took her Oxycodone 4 times day, she didn't call our office. She was instructed to keep a pain journal and to call office or send a My-Chart message to Dr Ranell Patrick, she verbalizes understanding. . We reviewed the narcotic policy, she was informed if this occurs again, this can lead to being discharge from our office, she verbalizes understanding.   [name] Morphine equivalent is 67.50 MME.    Last UDS was Performed on 07/23/2020, it was consistent.    Pain Inventory Average Pain 9 Pain Right Now 1 My pain is intermittent, constant, sharp, stabbing and aching  In the last 24 hours, has pain interfered with the following? General activity 7 Relation with others 6 Enjoyment of life 6 What TIME of day is your pain at its worst? varies Sleep (in general) Fair  Pain is worse with: walking, bending, sitting, inactivity, standing and some activites Pain improves with: heat/ice and medication Relief from Meds: 10     Family History  Problem Relation Age of Onset  . Breast cancer Maternal Aunt        unsure how old of onset  . Breast cancer Maternal Aunt        unsure how old of onset  . Colon cancer Neg Hx   . Esophageal cancer Neg Hx   .  Rectal cancer Neg Hx   . Stomach cancer Neg Hx    Social History   Socioeconomic History  . Marital status: Single    Spouse name: Not on file  . Number of children: Not on file  . Years of education: Not on file  . Highest education level: Not on file  Occupational History  . Not on file  Tobacco Use  . Smoking status: Never Smoker  . Smokeless tobacco: Never Used  Vaping Use  . Vaping Use: Never used  Substance and Sexual Activity  . Alcohol use: No  . Drug use: No    Types: "Crack" cocaine    Comment: last smoked 2011  . Sexual activity: Not on file  Other Topics Concern  . Not on file  Social History Narrative  . Not on file   Social Determinants of Health   Financial Resource Strain: Not on file  Food Insecurity: Not on file  Transportation Needs: Not on file  Physical Activity: Not on file  Stress: Not on file  Social Connections: Not on file   Past Surgical History:  Procedure Laterality Date  . ABDOMINAL HYSTERECTOMY    . CESAREAN SECTION    . KNEE SURGERY Right    x2  . UPPER GASTROINTESTINAL ENDOSCOPY     Past Medical History:  Diagnosis Date  . Allergy   . Anxiety   .  Arthritis   . Asthma   . Blood transfusion without reported diagnosis   . Depression   . GERD (gastroesophageal reflux disease)   . High cholesterol   . Hypertension   . Obesity   . Osteoporosis   . Recurrent boils   . Seizures ([place])    last seizure "3 years ago" per pt 06-16-16 KBW  . Sleep apnea    no CPAP used  . Uterine fibroid    There were no vitals taken for this visit.  Opioid Risk Score:   Fall Risk Score:  `1  Depression screen PHQ 2/9  Depression screen [facility] 2/9 03/18/2020 03/08/2017 01/11/2017 12/12/2016  Decreased Interest 1 0 0 1  Down, Depressed, Hopeless 1 0 0 1  PHQ - 2 Score 2 0 0 2  Altered sleeping 2 - - 1  Tired, decreased energy 1 - - 1  Change in appetite 1 - - 1  Feeling bad or failure about yourself  0 - - 1  Trouble concentrating 0 - - 1   Moving slowly or fidgety/restless 0 - - 1  Suicidal thoughts 0 - - 1  PHQ-9 Score 6 - - 9   Review of Systems     Objective:   Physical Exam Vitals and nursing note reviewed.  Constitutional:      Appearance: Normal appearance.  Cardiovascular:     Rate and Rhythm: Normal rate and regular rhythm.     Pulses: Normal pulses.     Heart sounds: Normal heart sounds.  Pulmonary:     Effort: Pulmonary effort is normal.     Breath sounds: Normal breath sounds.  Musculoskeletal:     Cervical back: Normal range of motion and neck supple.     Comments: Normal Muscle Bulk and Muscle Testing Reveals:  Upper Extremities: Full ROM and Muscle Strength 5/5  No Lumbar Paraspinal Tenderness Noted Today Lower Extremities: Full ROM and Muscle Strength 5/5 Arises from chair with ease Narrow Based Gait   Skin:    General: Skin is warm and dry.  Neurological:     Mental Status: She is alert and oriented to person, place, and time.  Psychiatric:        Mood and Affect: Mood normal.        Behavior: Behavior normal.           Assessment & Plan:  1. Cervicalgia/ Cervical Radiculitis: Continue HEP as Tolerated. Continue current medication regimen. Continue to Monitor.  2.Lumbar DDD/ Right Lumbar Radiculitis: Continue HEP and Continue current medication regimen. Continue to Monitor.  3. Chronic Pain Syndrome. Refilled: Oxycodone 15 mg one tablet 3 times a day as needed for pain. #90. No Early Refill. [name] self medicated, narcotic policy was reviewed. She verbalizes understanding.  We will continue the opioid monitoring program, this consists of regular clinic visits, examinations, urine drug screen, pill counts as well as use of [place] Controlled Substance Reporting system. A 12 month History has been reviewed on the [place] Controlled Substance Reporting System on 09/17/2020  F/U with Dr Ranell Patrick in 1 month

## 2020-09-18 ENCOUNTER — Telehealth: Payer: Self-pay

## 2020-09-18 NOTE — Telephone Encounter (Signed)
While calling in other medication for patient the pharmacy stated patient request refill on Diclofenac gel but 500 g instead of 2 g because 500 g last her a month and 2 g does not. She uses four times a day in multiple areas.

## 2020-09-19 NOTE — Telephone Encounter (Signed)
That would be fine 

## 2020-09-21 MED ORDER — DICLOFENAC SODIUM 1 % EX GEL: 500.0000 g | Freq: Four times a day (QID) | CUTANEOUS | 0 refills | Status: DC

## 2020-09-26 ENCOUNTER — Inpatient Hospital Stay: Admission: RE | Admit: 2020-09-26 | Payer: Medicaid Other | Source: Ambulatory Visit

## 2020-10-15 ENCOUNTER — Encounter
Payer: Medicaid Other | Attending: Physical Medicine and Rehabilitation | Admitting: Physical Medicine and Rehabilitation

## 2020-10-15 ENCOUNTER — Other Ambulatory Visit: Payer: Self-pay

## 2020-10-15 ENCOUNTER — Encounter: Payer: Self-pay | Admitting: Physical Medicine and Rehabilitation

## 2020-10-15 VITALS — BP 129/83 | HR 98 | Temp 98.2°F | Ht 62.0 in | Wt 236.0 lb

## 2020-10-15 DIAGNOSIS — G4701 Insomnia due to medical condition: Secondary | ICD-10-CM

## 2020-10-15 DIAGNOSIS — M5416 Radiculopathy, lumbar region: Secondary | ICD-10-CM | POA: Diagnosis present

## 2020-10-15 DIAGNOSIS — M5412 Radiculopathy, cervical region: Secondary | ICD-10-CM

## 2020-10-15 DIAGNOSIS — I10 Essential (primary) hypertension: Secondary | ICD-10-CM

## 2020-10-15 MED ORDER — GABAPENTIN 300 MG PO CAPS: 300.0000 mg | ORAL_CAPSULE | Freq: Three times a day (TID) | ORAL | 1 refills | Status: DC

## 2020-10-15 MED ORDER — OXYCODONE HCL 15 MG PO TABS
15.0000 mg | ORAL_TABLET | Freq: Four times a day (QID) | ORAL | 0 refills | Status: DC | PRN
Start: 2020-10-15 — End: 2020-11-12

## 2020-10-15 NOTE — Progress Notes (Signed)
Subjective:    Patient ID: Heather Perry, female    DOB: [birth year], 55 y.o.   MRN: [id number]  HPI: Heather Perry is a 55 y.o. female who returns for follow up appointment for chronic pain and medication refill.   1) Bilateral lumbar foraminal stenosis:  -Pain has been 10/10 on average -4/10 today -she asks whether she can increase oxycodone to 15mg  q6H prn, which was her former dose -has been having pain right foot.  -requires refill of her gabapentin  2) Cervical stenosis:  -She states her pain is located in her neck radiating into her right shoulder and right arm with tingling burning pain.  -Also reports she has lower back pain radiating into her right lower extremity. She rates her pain 1. Her current exercise regime is walking and performing stretching exercises. -reviewed cervical spine MRI- shows diffuse spinal stenosis  -she has been trying to focus on posture  3) HTN: BP is very well controlled today!  4) Insomnia: -gabapentin has helped her to sleep better   Pain Inventory Average Pain 10 Pain Right Now 4 My pain is intermittent, constant, sharp, stabbing and aching  In the last 24 hours, has pain interfered with the following? General activity 5 Relation with others 0 Enjoyment of life 9 What TIME of day is your pain at its worst? varies Sleep (in general) Poor  Pain is worse with: walking, bending, sitting, inactivity, standing and some activites Pain improves with: heat/ice, pacing activities and medication Relief from Meds: good4     Family History  Problem Relation Age of Onset  . Breast cancer Maternal Aunt        unsure how old of onset  . Breast cancer Maternal Aunt        unsure how old of onset  . Colon cancer Neg Hx   . Esophageal cancer Neg Hx   . Rectal cancer Neg Hx   . Stomach cancer Neg Hx    Social History   Socioeconomic History  . Marital status: Single    Spouse name: Not on file  . Number of children: Not on file  . Years  of education: Not on file  . Highest education level: Not on file  Occupational History  . Not on file  Tobacco Use  . Smoking status: Never Smoker  . Smokeless tobacco: Never Used  Vaping Use  . Vaping Use: Never used  Substance and Sexual Activity  . Alcohol use: No  . Drug use: No    Types: "Crack" cocaine    Comment: last smoked 2011  . Sexual activity: Not on file  Other Topics Concern  . Not on file  Social History Narrative  . Not on file   Social Determinants of Health   Financial Resource Strain: Not on file  Food Insecurity: Not on file  Transportation Needs: Not on file  Physical Activity: Not on file  Stress: Not on file  Social Connections: Not on file   Past Surgical History:  Procedure Laterality Date  . ABDOMINAL HYSTERECTOMY    . CESAREAN SECTION    . KNEE SURGERY Right    x2  . UPPER GASTROINTESTINAL ENDOSCOPY     Past Medical History:  Diagnosis Date  . Allergy   . Anxiety   . Arthritis   . Asthma   . Blood transfusion without reported diagnosis   . Depression   . GERD (gastroesophageal reflux disease)   . High cholesterol   . Hypertension   .  Obesity   . Osteoporosis   . Recurrent boils   . Seizures ([place])    last seizure "3 years ago" per pt 06-16-16 KBW  . Sleep apnea    no CPAP used  . Uterine fibroid    There were no vitals taken for this visit.  Opioid Risk Score:   Fall Risk Score:  `1  Depression screen PHQ 2/9  Depression screen [facility] 2/9 09/17/2020 03/18/2020 03/08/2017 01/11/2017 12/12/2016  Decreased Interest 0 1 0 0 1  Down, Depressed, Hopeless 0 1 0 0 1  PHQ - 2 Score 0 2 0 0 2  Altered sleeping - 2 - - 1  Tired, decreased energy - 1 - - 1  Change in appetite - 1 - - 1  Feeling bad or failure about yourself  - 0 - - 1  Trouble concentrating - 0 - - 1  Moving slowly or fidgety/restless - 0 - - 1  Suicidal thoughts - 0 - - 1  PHQ-9 Score - 6 - - 9   Review of Systems     Objective:    Physical Exam Gen: no  distress, normal appearing HEENT: oral mucosa pink and moist, NCAT Cardio: Reg rate Chest: normal effort, normal rate of breathing Abd: soft, non-distended Ext: no edema Psych: pleasant, normal affect Skin: intact Musculoskeletal:     Cervical back: Normal range of motion and neck supple.     Comments: Normal Muscle Bulk and Muscle Testing Reveals:  Upper Extremities: Full ROM and Muscle Strength 5/5  No Lumbar Paraspinal Tenderness Noted Today Lower Extremities: Full ROM and Muscle Strength 5/5 Arises from chair with ease Narrow Based Gait   Skin:    General: Skin is warm and dry.  Neurological:     Mental Status: She is alert and oriented to person, place, and time.  Psychiatric:        Mood and Affect: Mood normal.        Behavior: Behavior normal.       Assessment & Plan:  1. Cervicalgia/ Cervical Radiculitis: Continue HEP as Tolerated. Continue current medication regimen. Continue to Monitor.  -Discussed following foods that may reduce pain: 1) Ginger (especially studied for arthritis)- reduce leukotriene production to decrease inflammation 2) Blueberries- high in phytonutrients that decrease inflammation 3) Salmon- marine omega-3s reduce joint swelling and pain 4) Pumpkin seeds- reduce inflammation 5) dark chocolate- reduces inflammation 6) turmeric- reduces inflammation 7) tart cherries - reduce pain and stiffness 8) extra virgin olive oil - its compound olecanthal helps to block prostaglandins  9) chili peppers- can be eaten or applied topically via capsaicin 10) mint- helpful for headache, muscle aches, joint pain, and itching 11) garlic- reduces inflammation  Link to further information on diet for chronic pain: [email or web address]/  2.Lumbar DDD/ Right Lumbar Radiculitis: Continue HEP and Continue current medication regimen. Continue to Monitor.  3. Chronic Pain Syndrome. Refilled: Oxycodone  15 mg one tablet 4 times a day as needed for pain. #120. No Early Refill. She verbalizes understanding. Refilled Gabapnetin.  We will continue the opioid monitoring program, this consists of regular clinic visits, examinations, urine drug screen, pill counts as well as use of [place] Controlled Substance Reporting system. A 12 month History has been reviewed on the [place] Controlled Substance Reporting System on 09/17/2020 4. HTN: -Advised checking BP daily at home and logging results to bring into follow-up appointment with her PCP and myself. -Reviewed BP meds today.  -Advised regarding healthy foods that can  help lower blood pressure and provided with a list: 1) citrus foods- high in vitamins and minerals 2) salmon and other fatty fish - reduces inflammation and oxylipins 3) swiss chard (leafy green)- high level of nitrates 4) pumpkin seeds- one of the best natural sources of magnesium 5) Beans and lentils- high in fiber, magnesium, and potassium 6) Berries- high in flavonoids 7) Amaranth (whole grain, can be cooked similarly to rice and oats)- high in magnesium and fiber 8) Pistachios- even more effective at reducing BP than other nuts 9) Carrots- high in phenolic compounds that relax blood vessels and reduce inflammation 10) Celery- contain phthalides that relax tissues of arterial walls 11) Tomatoes- can also improve cholesterol and reduce risk of heart disease 12) Broccoli- good source of magnesium, calcium, and potassium 13) Greek yogurt: high in potassium and calcium 14) Herbs and spices: Celery seed, cilantro, saffron, lemongrass, black cumin, ginseng, cinnamon, cardamom, sweet basil, and ginger 15) Chia and flax seeds- also help to lower cholesterol and blood sugar 16) Beets- high levels of nitrates that relax blood vessels  17) spinach and bananas- high in potassium -Educated that goal BP is 120/80. -Made goal to incorporate some of the above foods into diet.    F/U with Zella Ball in 4 weeks, 4 weeks, then me in 4 weeks.

## 2020-10-15 NOTE — Patient Instructions (Signed)
-  Discussed following foods that may reduce pain: 1) Ginger (especially studied for arthritis)- reduce leukotriene production to decrease inflammation 2) Blueberries- high in phytonutrients that decrease inflammation 3) Salmon- marine omega-3s reduce joint swelling and pain 4) Pumpkin seeds- reduce inflammation 5) dark chocolate- reduces inflammation 6) turmeric- reduces inflammation 7) tart cherries - reduce pain and stiffness 8) extra virgin olive oil - its compound olecanthal helps to block prostaglandins  9) chili peppers- can be eaten or applied topically via capsaicin 10) mint- helpful for headache, muscle aches, joint pain, and itching 11) garlic- reduces inflammation  Link to further information on diet for chronic pain: [email or web address]   HTN: 1) citrus foods- high in vitamins and minerals 2) salmon and other fatty fish - reduces inflammation and oxylipins 3) swiss chard (leafy green)- high level of nitrates 4) pumpkin seeds- one of the best natural sources of magnesium 5) Beans and lentils- high in fiber, magnesium, and potassium 6) Berries- high in flavonoids 7) Amaranth (whole grain, can be cooked similarly to rice and oats)- high in magnesium and fiber 8) Pistachios- even more effective at reducing BP than other nuts 9) Carrots- high in phenolic compounds that relax blood vessels and reduce inflammation 10) Celery- contain phthalides that relax tissues of arterial walls 11) Tomatoes- can also improve cholesterol and reduce risk of heart disease 12) Broccoli- good source of magnesium, calcium, and potassium 13) Greek yogurt: high in potassium and calcium 14) Herbs and spices: Celery seed, cilantro, saffron, lemongrass, black cumin, ginseng, cinnamon, cardamom, sweet basil, and ginger 15) Chia and flax seeds- also help to lower cholesterol and blood sugar 16) Beets- high levels of nitrates that  relax blood vessels  17) spinach and bananas- high in potassium -Educated that goal BP is 120/80. -Made goal to incorporate some of the above foods into diet.

## 2020-10-16 ENCOUNTER — Telehealth: Payer: Self-pay | Admitting: Physical Medicine and Rehabilitation

## 2020-10-16 NOTE — Telephone Encounter (Signed)
Refill request for [employer]

## 2020-10-29 ENCOUNTER — Other Ambulatory Visit: Payer: Self-pay | Admitting: Physical Medicine and Rehabilitation

## 2020-11-12 ENCOUNTER — Other Ambulatory Visit: Payer: Self-pay

## 2020-11-12 ENCOUNTER — Encounter: Payer: Self-pay | Admitting: Registered Nurse

## 2020-11-12 ENCOUNTER — Encounter: Payer: Medicaid Other | Attending: Physical Medicine and Rehabilitation | Admitting: Registered Nurse

## 2020-11-12 VITALS — BP 115/80 | HR 102 | Temp 97.9°F | Ht 62.0 in | Wt 229.0 lb

## 2020-11-12 DIAGNOSIS — Z79891 Long term (current) use of opiate analgesic: Secondary | ICD-10-CM

## 2020-11-12 DIAGNOSIS — M25511 Pain in right shoulder: Secondary | ICD-10-CM | POA: Diagnosis present

## 2020-11-12 DIAGNOSIS — M25561 Pain in right knee: Secondary | ICD-10-CM | POA: Diagnosis present

## 2020-11-12 DIAGNOSIS — Z5181 Encounter for therapeutic drug level monitoring: Secondary | ICD-10-CM | POA: Diagnosis present

## 2020-11-12 DIAGNOSIS — G8929 Other chronic pain: Secondary | ICD-10-CM

## 2020-11-12 DIAGNOSIS — M5136 Other intervertebral disc degeneration, lumbar region: Secondary | ICD-10-CM | POA: Diagnosis not present

## 2020-11-12 DIAGNOSIS — G894 Chronic pain syndrome: Secondary | ICD-10-CM | POA: Diagnosis present

## 2020-11-12 MED ORDER — OXYCODONE HCL 15 MG PO TABS: 15.0000 mg | ORAL_TABLET | Freq: Four times a day (QID) | ORAL | 0 refills | Status: DC | PRN

## 2020-11-12 NOTE — Progress Notes (Signed)
Subjective:    Patient ID: Heather Perry, female    DOB: 1966/05/22, 55 y.o.   MRN: [id number]  HPI: Heather Perry is a 55 y.o. female who returns for follow up appointment for chronic pain and medication refill. She states her pain is located in her right shoulder, lower back and right knee pain. She rates her pain 4. Her  current exercise regime is walking and performing stretching exercises.  Heather Perry Morphine equivalent is 90.00 MME.  UDS ordered today.    Pain Inventory Average Pain 9 Pain Right Now 4 My pain is sharp, stabbing and aching  In the last 24 hours, has pain interfered with the following? General activity 7 Relation with others 9 Enjoyment of life 8 What TIME of day is your pain at its worst? morning , daytime, evening and night Sleep (in general) Fair  Pain is worse with: walking, bending, sitting and some activites Pain improves with: medication Relief from Meds: 10  Family History  Problem Relation Age of Onset  . Breast cancer Maternal Aunt        unsure how old of onset  . Breast cancer Maternal Aunt        unsure how old of onset  . Colon cancer Neg Hx   . Esophageal cancer Neg Hx   . Rectal cancer Neg Hx   . Stomach cancer Neg Hx    Social History   Socioeconomic History  . Marital status: Single    Spouse name: Not on file  . Number of children: Not on file  . Years of education: Not on file  . Highest education level: Not on file  Occupational History  . Not on file  Tobacco Use  . Smoking status: Never Smoker  . Smokeless tobacco: Never Used  Vaping Use  . Vaping Use: Never used  Substance and Sexual Activity  . Alcohol use: No  . Drug use: No    Types: "Crack" cocaine    Comment: last smoked 2011  . Sexual activity: Not on file  Other Topics Concern  . Not on file  Social History Narrative  . Not on file   Social Determinants of Health   Financial Resource Strain: Not on file  Food Insecurity: Not on file   Transportation Needs: Not on file  Physical Activity: Not on file  Stress: Not on file  Social Connections: Not on file   Past Surgical History:  Procedure Laterality Date  . ABDOMINAL HYSTERECTOMY    . CESAREAN SECTION    . KNEE SURGERY Right    x2  . UPPER GASTROINTESTINAL ENDOSCOPY     Past Surgical History:  Procedure Laterality Date  . ABDOMINAL HYSTERECTOMY    . CESAREAN SECTION    . KNEE SURGERY Right    x2  . UPPER GASTROINTESTINAL ENDOSCOPY     Past Medical History:  Diagnosis Date  . Allergy   . Anxiety   . Arthritis   . Asthma   . Blood transfusion without reported diagnosis   . Depression   . GERD (gastroesophageal reflux disease)   . High cholesterol   . Hypertension   . Obesity   . Osteoporosis   . Recurrent boils   . Seizures ([place])    last seizure "3 years ago" per pt 06-16-16 KBW  . Sleep apnea    no CPAP used  . Uterine fibroid    BP 115/80   Pulse (!) 102   Temp 97.9 F (36.6 C)  Ht 5\' 2"  (1.575 m)   Wt 229 lb (103.9 kg)   SpO2 95%   BMI 41.88 kg/m   Opioid Risk Score:   Fall Risk Score:  `1  Depression screen PHQ 2/9  Depression screen [facility] 2/9 10/15/2020 09/17/2020 03/18/2020 03/08/2017 01/11/2017 12/12/2016  Decreased Interest 0 0 1 0 0 1  Down, Depressed, Hopeless 0 0 1 0 0 1  PHQ - 2 Score 0 0 2 0 0 2  Altered sleeping - - 2 - - 1  Tired, decreased energy - - 1 - - 1  Change in appetite - - 1 - - 1  Feeling bad or failure about yourself  - - 0 - - 1  Trouble concentrating - - 0 - - 1  Moving slowly or fidgety/restless - - 0 - - 1  Suicidal thoughts - - 0 - - 1  PHQ-9 Score - - 6 - - 9    Review of Systems  Musculoskeletal: Positive for back pain.       Shoulder pain Arm pain Knee pain  Neurological: Positive for weakness and numbness.  All other systems reviewed and are negative.      Objective:   Physical Exam Vitals and nursing note reviewed.  Constitutional:      Appearance: Normal appearance. She is obese.   Cardiovascular:     Rate and Rhythm: Normal rate and regular rhythm.     Pulses: Normal pulses.     Heart sounds: Normal heart sounds.  Pulmonary:     Effort: Pulmonary effort is normal.     Breath sounds: Normal breath sounds.  Musculoskeletal:     Cervical back: Normal range of motion and neck supple.     Comments: Normal Muscle Bulk and Muscle Testing Reveals:  Upper Extremities: Right: Decreased ROM 45 Degrees  and Muscle Strength 5/5 Right AC Joint Tenderness Left Upper Extremity: Full ROM and Muscle Strength 5/5 Lower Extremities : Full ROM and Muscle Strength 5/5 Arises from Wheelchair slowly Antalgic Gait   Skin:    General: Skin is warm and dry.  Neurological:     Mental Status: She is alert and oriented to person, place, and time.  Psychiatric:        Mood and Affect: Mood normal.        Behavior: Behavior normal.           Assessment & Plan:  1. Cervicalgia/ Cervical Radiculitis:No complaints today.  Continue HEP as Tolerated. Continue current medication regimen. Continue to Monitor. 11/12/2020 2.Lumbar DDD/ Right Lumbar Radiculitis: Continue HEP and Continue current medication regimen. Continue to Monitor. 11/12/2020 3. Chronic Pain Syndrome. Refilled: Oxycodone 15 mg one tablet 3 times a day as needed for pain. #90. No Early Refill. We will continue the opioid monitoring program, this consists of regular clinic visits, examinations, urine drug screen, pill counts as well as use of [place] Controlled Substance Reporting system. A 12 month History has been reviewed on the [place] Controlled Substance Reporting System on 11/12/2020 4. Right Knee Pain: Continue HEP as Tolerated. Continue to Monitor.   F/U with Dr Ranell Patrick in 1 month

## 2020-11-13 ENCOUNTER — Other Ambulatory Visit: Payer: Self-pay | Admitting: Physical Medicine and Rehabilitation

## 2020-11-19 LAB — TOXASSURE SELECT,+ANTIDEPR,UR

## 2020-11-26 ENCOUNTER — Telehealth: Payer: Self-pay | Admitting: *Deleted

## 2020-11-26 NOTE — Telephone Encounter (Signed)
Urine drug screen for this encounter is consistent for prescribed medication 

## 2020-12-03 ENCOUNTER — Ambulatory Visit: Payer: Medicaid Other

## 2020-12-08 ENCOUNTER — Other Ambulatory Visit: Payer: Self-pay | Admitting: Registered Nurse

## 2020-12-08 ENCOUNTER — Other Ambulatory Visit: Payer: Self-pay | Admitting: Physical Medicine and Rehabilitation

## 2020-12-08 NOTE — Telephone Encounter (Signed)
PMP was Reviewed.  Last Oxycodone was filled on 11/14/2020. She has a scheduled appointment in our office.

## 2020-12-11 ENCOUNTER — Ambulatory Visit: Payer: Medicaid Other | Admitting: Registered Nurse

## 2020-12-16 ENCOUNTER — Telehealth: Payer: Self-pay | Admitting: *Deleted

## 2020-12-16 ENCOUNTER — Encounter: Payer: Self-pay | Admitting: Registered Nurse

## 2020-12-16 ENCOUNTER — Other Ambulatory Visit: Payer: Self-pay

## 2020-12-16 ENCOUNTER — Encounter: Payer: Medicaid Other | Attending: Physical Medicine and Rehabilitation | Admitting: Registered Nurse

## 2020-12-16 VITALS — BP 106/74 | HR 99 | Temp 97.9°F | Ht 62.0 in | Wt 230.8 lb

## 2020-12-16 DIAGNOSIS — Z5181 Encounter for therapeutic drug level monitoring: Secondary | ICD-10-CM | POA: Insufficient documentation

## 2020-12-16 DIAGNOSIS — M5136 Other intervertebral disc degeneration, lumbar region: Secondary | ICD-10-CM | POA: Diagnosis present

## 2020-12-16 DIAGNOSIS — M542 Cervicalgia: Secondary | ICD-10-CM | POA: Diagnosis present

## 2020-12-16 DIAGNOSIS — G8929 Other chronic pain: Secondary | ICD-10-CM | POA: Insufficient documentation

## 2020-12-16 DIAGNOSIS — Z79891 Long term (current) use of opiate analgesic: Secondary | ICD-10-CM | POA: Insufficient documentation

## 2020-12-16 DIAGNOSIS — M25511 Pain in right shoulder: Secondary | ICD-10-CM | POA: Diagnosis present

## 2020-12-16 DIAGNOSIS — G894 Chronic pain syndrome: Secondary | ICD-10-CM | POA: Insufficient documentation

## 2020-12-16 DIAGNOSIS — M5412 Radiculopathy, cervical region: Secondary | ICD-10-CM | POA: Insufficient documentation

## 2020-12-16 MED ORDER — OXYCODONE HCL 15 MG PO TABS: 15.0000 mg | ORAL_TABLET | Freq: Four times a day (QID) | ORAL | 0 refills | Status: DC | PRN

## 2020-12-16 NOTE — Progress Notes (Signed)
Subjective:    Patient ID: Heather Perry, female    DOB: 05/23/1966, 55 y.o.   MRN: [id number]  HPI: Heather Perry is a 55 y.o. female who returns for follow up appointment for chronic pain and medication refill. She states her pain is located in her neck radiating into her right shoulder, right arm and right hand and reports lower back pain occasionally. She rates her pain 3. Her current exercise regime is walking and performing stretching exercises.  Ms. Rabel states Dr Ranell Patrick was going to resume her Oxycodone 20 mg tablets, no documentation noted of the above. She has a scheduled appointment with Dr Ranell Patrick next month, she was encouraged to speak with Dr Ranell Patrick regarding the above, she verbalizes understanding.   Ms. Eastep Morphine equivalent is 84.00 MME.   Last UDS was Performed on 11/12/2020, it was consistent.    Pain Inventory Average Pain 9 Pain Right Now 3 My pain is sharp, stabbing, and aching  In the last 24 hours, has pain interfered with the following? General activity 7 Relation with others 6 Enjoyment of life 3 What TIME of day is your pain at its worst? daytime and night Sleep (in general) Fair  Pain is worse with: walking, bending, sitting, standing, and some activites Pain improves with: heat/ice and medication Relief from Meds: 9  Family History  Problem Relation Age of Onset   Breast cancer Maternal Aunt        unsure how old of onset   Breast cancer Maternal Aunt        unsure how old of onset   Colon cancer Neg Hx    Esophageal cancer Neg Hx    Rectal cancer Neg Hx    Stomach cancer Neg Hx    Social History   Socioeconomic History   Marital status: Single    Spouse name: Not on file   Number of children: Not on file   Years of education: Not on file   Highest education level: Not on file  Occupational History   Not on file  Tobacco Use   Smoking status: Never   Smokeless tobacco: Never  Vaping Use   Vaping Use: Never used  Substance and  Sexual Activity   Alcohol use: No   Drug use: No    Types: "Crack" cocaine    Comment: last smoked 2011   Sexual activity: Not on file  Other Topics Concern   Not on file  Social History Narrative   Not on file   Social Determinants of Health   Financial Resource Strain: Not on file  Food Insecurity: Not on file  Transportation Needs: Not on file  Physical Activity: Not on file  Stress: Not on file  Social Connections: Not on file   Past Surgical History:  Procedure Laterality Date   ABDOMINAL HYSTERECTOMY     CESAREAN SECTION     KNEE SURGERY Right    x2   UPPER GASTROINTESTINAL ENDOSCOPY     Past Surgical History:  Procedure Laterality Date   ABDOMINAL HYSTERECTOMY     CESAREAN SECTION     KNEE SURGERY Right    x2   UPPER GASTROINTESTINAL ENDOSCOPY     Past Medical History:  Diagnosis Date   Allergy    Anxiety    Arthritis    Asthma    Blood transfusion without reported diagnosis    Depression    GERD (gastroesophageal reflux disease)    High cholesterol    Hypertension  Obesity    Osteoporosis    Recurrent boils    Seizures ([place])    last seizure "3 years ago" per pt 06-16-16 KBW   Sleep apnea    no CPAP used   Uterine fibroid    BP 106/74   Pulse 99   Temp 97.9 F (36.6 C) (Oral)   Ht 5\' 2"  (1.575 m)   Wt 230 lb 12.8 oz (104.7 kg)   SpO2 97%   BMI 42.21 kg/m   Opioid Risk Score:   Fall Risk Score:  `1  Depression screen PHQ 2/9  Depression screen [facility] 2/9 10/15/2020 09/17/2020 03/18/2020 03/08/2017 01/11/2017 12/12/2016  Decreased Interest 0 0 1 0 0 1  Down, Depressed, Hopeless 0 0 1 0 0 1  PHQ - 2 Score 0 0 2 0 0 2  Altered sleeping - - 2 - - 1  Tired, decreased energy - - 1 - - 1  Change in appetite - - 1 - - 1  Feeling bad or failure about yourself  - - 0 - - 1  Trouble concentrating - - 0 - - 1  Moving slowly or fidgety/restless - - 0 - - 1  Suicidal thoughts - - 0 - - 1  PHQ-9 Score - - 6 - - 9      Review of Systems   Musculoskeletal:  Positive for back pain.       Right knee pain Right arm pain  All other systems reviewed and are negative.     Objective:   Physical Exam Vitals and nursing note reviewed.  Constitutional:      Appearance: Normal appearance. She is obese.  Cardiovascular:     Rate and Rhythm: Normal rate and regular rhythm.     Pulses: Normal pulses.     Heart sounds: Normal heart sounds.  Pulmonary:     Effort: Pulmonary effort is normal.     Breath sounds: Normal breath sounds.  Musculoskeletal:     Cervical back: Normal range of motion and neck supple.     Comments: Normal Muscle Bulk and Muscle Testing Reveals:  Upper Extremities: Right: Decreased ROM 45 Degrees and Muscle Strength 5/5 Left Upper Extremity: Full ROM and Muscle Strength 5/5  Lower Extremities: Full ROM and Muscle Strength 5/5 Arises from chair with ease Narrow Based  Gait     Skin:    General: Skin is warm and dry.  Neurological:     Mental Status: She is alert and oriented to person, place, and time.  Psychiatric:        Mood and Affect: Mood normal.        Behavior: Behavior normal.         Assessment & Plan:  1. Cervicalgia/ Cervical Radiculitis:RX: X-ray.  Continue HEP as Tolerated. Continue current medication regimen. Continue to Monitor. 12/16/2020 2.Lumbar DDD/ Right Lumbar Radiculitis: Continue HEP and Continue current medication regimen. Continue to Monitor. 12/16/2020 3. Chronic Pain Syndrome. Refilled: Oxycodone 15 mg one tablet 4 times a day as needed for pain. #120.  We will continue the opioid monitoring program, this consists of regular clinic visits, examinations, urine drug screen, pill counts as well as use of [place] Controlled Substance Reporting system. A 12 month History has been reviewed on the [place] Controlled Substance Reporting System on 12/16/2020 4. Right Knee Pain: No complaints today. Continue HEP as Tolerated. Continue to Monitor. 12/16/2020   F/U with  Dr Ranell Patrick in 1 month

## 2020-12-16 NOTE — Telephone Encounter (Addendum)
Prior auth for oxycodone 15 mg #120 /30d initiated with [employer] over phone. Await response.  Prior auth approval received from [facility] , approved 12/16/20 - 06/14/21.  I have notified Ms Soltys and asked that she put a reminder in her phone to notify us by 06/03/21 of needing new prior auth. Approval faxed to [facility].

## 2020-12-31 ENCOUNTER — Ambulatory Visit
Admission: RE | Admit: 2020-12-31 | Discharge: 2020-12-31 | Disposition: A | Discharge status: Home / Self Care | Payer: Medicaid Other | Source: Ambulatory Visit | Attending: Registered Nurse | Admitting: Registered Nurse

## 2021-01-04 ENCOUNTER — Other Ambulatory Visit: Payer: Self-pay | Admitting: Physical Medicine and Rehabilitation

## 2021-01-05 ENCOUNTER — Other Ambulatory Visit: Payer: Self-pay | Admitting: Physical Medicine and Rehabilitation

## 2021-01-07 ENCOUNTER — Encounter
Payer: Medicaid Other | Attending: Physical Medicine and Rehabilitation | Admitting: Physical Medicine and Rehabilitation

## 2021-01-07 ENCOUNTER — Other Ambulatory Visit: Payer: Self-pay

## 2021-01-07 ENCOUNTER — Encounter: Payer: Self-pay | Admitting: Physical Medicine and Rehabilitation

## 2021-01-07 VITALS — BP 123/72 | HR 105 | Temp 98.1°F | Ht 62.0 in | Wt 230.0 lb

## 2021-01-07 DIAGNOSIS — G894 Chronic pain syndrome: Secondary | ICD-10-CM | POA: Diagnosis present

## 2021-01-07 DIAGNOSIS — Z5181 Encounter for therapeutic drug level monitoring: Secondary | ICD-10-CM | POA: Diagnosis present

## 2021-01-07 DIAGNOSIS — Z79891 Long term (current) use of opiate analgesic: Secondary | ICD-10-CM | POA: Diagnosis present

## 2021-01-07 MED ORDER — GABAPENTIN 400 MG PO CAPS
400.0000 mg | ORAL_CAPSULE | Freq: Three times a day (TID) | ORAL | 3 refills | Status: DC
Start: 2021-01-07 — End: 2021-12-01

## 2021-01-07 MED ORDER — OXYCODONE HCL 15 MG PO TABS: 15.0000 mg | ORAL_TABLET | Freq: Four times a day (QID) | ORAL | 0 refills | Status: DC | PRN

## 2021-01-07 MED ORDER — DULOXETINE HCL 20 MG PO CPEP: 20.0000 mg | ORAL_CAPSULE | Freq: Every day | ORAL | 2 refills | Status: DC

## 2021-01-07 NOTE — Progress Notes (Signed)
Subjective:    Patient ID: Heather Perry, female    DOB: [birth year], 55 y.o.   MRN: [id number]  HPI: Heather Perry is a 55 y.o. female who returns for f/u appointment for cervical and lumbar spinal stenosis.   1) Bilateral lumbar foraminal stenosis:  -Pain has been 10/10 on average -4/10 today -she asks whether she can increase oxycodone to 15mg  q6H prn, which was her former dose -has been having pain right foot.  -requires refill of her gabapentin  2) Cervical stenosis:  -She states her pain is located in her neck radiating into her right shoulder and right arm with tingling burning pain.  -Also reports she has lower back pain radiating into her right lower extremity. She rates her pain 1. Her current exercise regime is walking and performing stretching exercises. -reviewed cervical spine MRI- shows diffuse spinal stenosis  -she has been trying to focus on posture -she has never tried PT.   3) HTN: BP is very well controlled today!  4) Insomnia: -gabapentin has helped her to sleep better   Pain Inventory Average Pain 10 Pain Right Now 10 My pain is constant, sharp, stabbing, and aching  In the last 24 hours, has pain interfered with the following? General activity 9 Relation with others 7 Enjoyment of life 10 What TIME of day is your pain at its worst? morning , daytime, evening, and night Sleep (in general) Poor  Pain is worse with: walking, bending, sitting, standing, and some activites Pain improves with: heat/ice and medication Relief from Meds:  good 4     Family History  Problem Relation Age of Onset   Breast cancer Maternal Aunt        unsure how old of onset   Breast cancer Maternal Aunt        unsure how old of onset   Colon cancer Neg Hx    Esophageal cancer Neg Hx    Rectal cancer Neg Hx    Stomach cancer Neg Hx    Social History   Socioeconomic History   Marital status: Single    Spouse name: Not on file   Number of children: Not on file    Years of education: Not on file   Highest education level: Not on file  Occupational History   Not on file  Tobacco Use   Smoking status: Never   Smokeless tobacco: Never  Vaping Use   Vaping Use: Never used  Substance and Sexual Activity   Alcohol use: No   Drug use: No    Types: "Crack" cocaine    Comment: last smoked 2011   Sexual activity: Not on file  Other Topics Concern   Not on file  Social History Narrative   Not on file   Social Determinants of Health   Financial Resource Strain: Not on file  Food Insecurity: Not on file  Transportation Needs: Not on file  Physical Activity: Not on file  Stress: Not on file  Social Connections: Not on file   Past Surgical History:  Procedure Laterality Date   ABDOMINAL HYSTERECTOMY     CESAREAN SECTION     KNEE SURGERY Right    x2   UPPER GASTROINTESTINAL ENDOSCOPY     Past Medical History:  Diagnosis Date   Allergy    Anxiety    Arthritis    Asthma    Blood transfusion without reported diagnosis    Depression    GERD (gastroesophageal reflux disease)    High cholesterol  Hypertension    Obesity    Osteoporosis    Recurrent boils    Seizures ([place])    last seizure "3 years ago" per pt 06-16-16 KBW   Sleep apnea    no CPAP used   Uterine fibroid    BP 123/72   Pulse (!) 105   Temp 98.1 F (36.7 C)   Ht 5\' 2"  (1.575 m)   Wt 230 lb (104.3 kg)   SpO2 (!) 89%   BMI 42.07 kg/m   Opioid Risk Score:   Fall Risk Score:  `1  Depression screen PHQ 2/9  Depression screen [facility] 2/9 10/15/2020 09/17/2020 03/18/2020 03/08/2017 01/11/2017 12/12/2016  Decreased Interest 0 0 1 0 0 1  Down, Depressed, Hopeless 0 0 1 0 0 1  PHQ - 2 Score 0 0 2 0 0 2  Altered sleeping - - 2 - - 1  Tired, decreased energy - - 1 - - 1  Change in appetite - - 1 - - 1  Feeling bad or failure about yourself  - - 0 - - 1  Trouble concentrating - - 0 - - 1  Moving slowly or fidgety/restless - - 0 - - 1  Suicidal thoughts - - 0 - - 1  PHQ-9  Score - - 6 - - 9   Review of Systems  Constitutional: Negative.   HENT: Negative.    Eyes: Negative.   Respiratory:  Positive for shortness of breath.   Cardiovascular: Negative.   Gastrointestinal: Negative.   Endocrine: Negative.   Genitourinary: Negative.   Musculoskeletal:  Positive for arthralgias.  Allergic/Immunologic: Negative.   Neurological: Negative.   Hematological: Negative.   Psychiatric/Behavioral: Negative.        Objective:    Physical Exam Gen: no distress, normal appearing HEENT: oral mucosa pink and moist, NCAT Cardio: Reg rate Chest: normal effort, normal rate of breathing Abd: soft, non-distended Ext: no edema Psych: pleasant, normal affect Skin: intact Musculoskeletal:     Cervical back: Normal range of motion and neck supple.     Comments: Normal Muscle Bulk and Muscle Testing Reveals:  Upper Extremities: Full ROM and Muscle Strength 5/5  No Lumbar Paraspinal Tenderness Noted Today Lower Extremities: Full ROM and Muscle Strength 5/5 Arises from chair with ease Narrow Based Gait   Skin:    General: Skin is warm and dry.  Neurological:     Mental Status: She is alert and oriented to person, place, and time.  Psychiatric:        Mood and Affect: Mood normal.        Behavior: Behavior normal.       Assessment & Plan:  1. Cervicalgia/ Cervical Radiculitis: Continue HEP as Tolerated. Continue current medication regimen. Continue to Monitor.  -Discussed following foods that may reduce pain:  -Discussed following foods that may reduce pain: 1) Ginger (especially studied for arthritis)- reduce leukotriene production to decrease inflammation 2) Blueberries- high in phytonutrients that decrease inflammation 3) Salmon- marine omega-3s reduce joint swelling and pain 4) Pumpkin seeds- reduce inflammation 5) dark chocolate- reduces inflammation 6) turmeric- reduces inflammation 7) tart cherries - reduce pain and stiffness 8) extra virgin olive oil -  its compound olecanthal helps to block prostaglandins  9) chili peppers- can be eaten or applied topically via capsaicin 10) mint- helpful for headache, muscle aches, joint pain, and itching 11) garlic- reduces inflammation  Link to further information on diet for chronic pain: [email or web address]   2.Lumbar DDD/ Right Lumbar Radiculitis:  Continue HEP and Continue current medication regimen. Continue to Monitor.  3. Chronic Pain Syndrome. Refilled Oxycodone 15 mg one tablet 4 times a day as needed for pain. #120. No Early Refill. She verbalizes understanding. Refilled Gabapnetin.  We will continue the opioid monitoring program, this consists of regular clinic visits, examinations, urine drug screen, pill counts as well as use of [place] Controlled Substance Reporting system. A 12 month History has been reviewed on the [place] Controlled Substance Reporting System on 09/17/2020 4. HTN: -Advised checking BP daily at home and logging results to bring into follow-up appointment with her PCP and myself. -Reviewed BP meds today.  -Advised regarding healthy foods that can help lower blood pressure and provided with a list: 1) citrus foods- high in vitamins and minerals 2) salmon and other fatty fish - reduces inflammation and oxylipins 3) swiss chard (leafy green)- high level of nitrates 4) pumpkin seeds- one of the best natural sources of magnesium 5) Beans and lentils- high in fiber, magnesium, and potassium 6) Berries- high in flavonoids 7) Amaranth (whole grain, can be cooked similarly to rice and oats)- high in magnesium and fiber 8) Pistachios- even more effective at reducing BP than other nuts 9) Carrots- high in phenolic compounds that relax blood vessels and reduce inflammation 10) Celery- contain phthalides that relax tissues of arterial walls 11) Tomatoes- can also improve cholesterol and reduce risk of  heart disease 12) Broccoli- good source of magnesium, calcium, and potassium 13) Greek yogurt: high in potassium and calcium 14) Herbs and spices: Celery seed, cilantro, saffron, lemongrass, black cumin, ginseng, cinnamon, cardamom, sweet basil, and ginger 15) Chia and flax seeds- also help to lower cholesterol and blood sugar 16) Beets- high levels of nitrates that relax blood vessels  17) spinach and bananas- high in potassium -Educated that goal BP is 120/80. -Made goal to incorporate some of the above foods into diet.   5) Depression: prescribed Cymbalta 20mg  daily.   F/U with Zella Ball in 4 weeks, 4 weeks, then me in 4 weeks.

## 2021-01-07 NOTE — Patient Instructions (Signed)
1) Ginger (especially studied for arthritis)- reduce leukotriene production to decrease inflammation 2) Blueberries- high in phytonutrients that decrease inflammation 3) Salmon- marine omega-3s reduce joint swelling and pain 4) Pumpkin seeds- reduce inflammation 5) dark chocolate- reduces inflammation 6) turmeric- reduces inflammation 7) tart cherries - reduce pain and stiffness 8) extra virgin olive oil - its compound olecanthal helps to block prostaglandins  9) chili peppers- can be eaten or applied topically via capsaicin 10) mint- helpful for headache, muscle aches, joint pain, and itching 11) garlic- reduces inflammation  Link to further information on diet for chronic pain: [email or web address]  

## 2021-01-11 LAB — TOXASSURE SELECT,+ANTIDEPR,UR

## 2021-01-13 ENCOUNTER — Other Ambulatory Visit: Payer: Self-pay | Admitting: Physical Medicine and Rehabilitation

## 2021-01-14 ENCOUNTER — Telehealth: Payer: Self-pay | Admitting: *Deleted

## 2021-01-14 NOTE — Telephone Encounter (Signed)
Urine drug screen for this encounter is consistent for prescribed medication 

## 2021-01-27 ENCOUNTER — Ambulatory Visit
Admission: RE | Admit: 2021-01-27 | Discharge: 2021-01-27 | Disposition: A | Discharge status: Home / Self Care | Payer: Medicaid Other | Source: Ambulatory Visit | Attending: Family Medicine | Admitting: Family Medicine

## 2021-01-27 ENCOUNTER — Other Ambulatory Visit: Payer: Self-pay

## 2021-01-27 DIAGNOSIS — Z1231 Encounter for screening mammogram for malignant neoplasm of breast: Secondary | ICD-10-CM

## 2021-02-02 ENCOUNTER — Other Ambulatory Visit: Payer: Self-pay | Admitting: Physical Medicine and Rehabilitation

## 2021-02-03 ENCOUNTER — Other Ambulatory Visit: Payer: Self-pay | Admitting: Physical Medicine and Rehabilitation

## 2021-02-08 ENCOUNTER — Other Ambulatory Visit: Payer: Self-pay | Admitting: Registered Nurse

## 2021-02-09 ENCOUNTER — Other Ambulatory Visit: Payer: Self-pay

## 2021-02-09 ENCOUNTER — Encounter: Payer: Medicaid Other | Attending: Physical Medicine and Rehabilitation | Admitting: Registered Nurse

## 2021-02-09 ENCOUNTER — Encounter: Payer: Self-pay | Admitting: Registered Nurse

## 2021-02-09 VITALS — BP 131/84 | HR 111 | Temp 98.2°F | Ht 62.0 in | Wt 219.4 lb

## 2021-02-09 DIAGNOSIS — M79609 Pain in unspecified limb: Secondary | ICD-10-CM | POA: Diagnosis present

## 2021-02-09 DIAGNOSIS — M542 Cervicalgia: Secondary | ICD-10-CM

## 2021-02-09 DIAGNOSIS — G8929 Other chronic pain: Secondary | ICD-10-CM

## 2021-02-09 DIAGNOSIS — Z5181 Encounter for therapeutic drug level monitoring: Secondary | ICD-10-CM

## 2021-02-09 DIAGNOSIS — G894 Chronic pain syndrome: Secondary | ICD-10-CM

## 2021-02-09 DIAGNOSIS — M25511 Pain in right shoulder: Secondary | ICD-10-CM | POA: Insufficient documentation

## 2021-02-09 DIAGNOSIS — R202 Paresthesia of skin: Secondary | ICD-10-CM | POA: Diagnosis present

## 2021-02-09 DIAGNOSIS — M5136 Other intervertebral disc degeneration, lumbar region: Secondary | ICD-10-CM | POA: Diagnosis present

## 2021-02-09 DIAGNOSIS — M5412 Radiculopathy, cervical region: Secondary | ICD-10-CM | POA: Diagnosis present

## 2021-02-09 DIAGNOSIS — M51369 Other intervertebral disc degeneration, lumbar region without mention of lumbar back pain or lower extremity pain: Secondary | ICD-10-CM

## 2021-02-09 DIAGNOSIS — Z79891 Long term (current) use of opiate analgesic: Secondary | ICD-10-CM

## 2021-02-09 MED ORDER — OXYCODONE HCL 15 MG PO TABS: 15.0000 mg | ORAL_TABLET | Freq: Four times a day (QID) | ORAL | 0 refills | Status: DC | PRN

## 2021-02-09 NOTE — Progress Notes (Signed)
Subjective:    Patient ID: Heather Perry, female    DOB: Apr 02, 1966, 55 y.o.   MRN: [id number]  HPI: Heather Perry is a 55 y.o. female who returns for follow up appointment for chronic pain and medication refill. She states her  pain is located in her neck radiating into her right shoulder and right arm with tingling  and burning. Also reports right lower extremity pain and right foot pain. She rates her pain 6. Her current exercise regime is walking and performing stretching exercises.  Heather Perry states a pharmacist told her in the past her dilantin was decreasing her oxycodone, when this provider stated I will call her pharmacy. She stated this was years ago, this provider informed Ms. Milbourn her Oxycodone will remain the same and I will call her pharmacy and speak with the pharmacist. She verbalizes understanding.   Heather Perry Morphine equivalent is 90.00 MME.   UDS ordered today.      Pain Inventory Average Pain 9 Pain Right Now 6 My pain is intermittent, sharp, and stabbing  In the last 24 hours, has pain interfered with the following? General activity 5 Relation with others 3 Enjoyment of life 7 What TIME of day is your pain at its worst? morning , daytime, evening, and night Sleep (in general) Fair  Pain is worse with: walking, bending, sitting, inactivity, standing, and some activites Pain improves with: rest and medication Relief from Meds: 9  Family History  Problem Relation Age of Onset  . Breast cancer Maternal Aunt        unsure how old of onset  . Breast cancer Maternal Aunt        unsure how old of onset  . Colon cancer Neg Hx   . Esophageal cancer Neg Hx   . Rectal cancer Neg Hx   . Stomach cancer Neg Hx    Social History   Socioeconomic History  . Marital status: Single    Spouse name: Not on file  . Number of children: Not on file  . Years of education: Not on file  . Highest education level: Not on file  Occupational History  . Not on file   Tobacco Use  . Smoking status: Never  . Smokeless tobacco: Never  Vaping Use  . Vaping Use: Never used  Substance and Sexual Activity  . Alcohol use: No  . Drug use: No    Types: "Crack" cocaine    Comment: last smoked 2011  . Sexual activity: Not on file  Other Topics Concern  . Not on file  Social History Narrative  . Not on file   Social Determinants of Health   Financial Resource Strain: Not on file  Food Insecurity: Not on file  Transportation Needs: Not on file  Physical Activity: Not on file  Stress: Not on file  Social Connections: Not on file   Past Surgical History:  Procedure Laterality Date  . ABDOMINAL HYSTERECTOMY    . CESAREAN SECTION    . KNEE SURGERY Right    x2  . UPPER GASTROINTESTINAL ENDOSCOPY     Past Surgical History:  Procedure Laterality Date  . ABDOMINAL HYSTERECTOMY    . CESAREAN SECTION    . KNEE SURGERY Right    x2  . UPPER GASTROINTESTINAL ENDOSCOPY     Past Medical History:  Diagnosis Date  . Allergy   . Anxiety   . Arthritis   . Asthma   . Blood transfusion without reported diagnosis   .  Depression   . GERD (gastroesophageal reflux disease)   . High cholesterol   . Hypertension   . Obesity   . Osteoporosis   . Recurrent boils   . Seizures ([place])    last seizure "3 years ago" per pt 06-16-16 KBW  . Sleep apnea    no CPAP used  . Uterine fibroid    BP 131/84   Pulse (!) 111   Temp 98.2 F (36.8 C)   Ht '5\' 2"'$  (1.575 m)   Wt 219 lb 6.4 oz (99.5 kg)   SpO2 96%   BMI 40.13 kg/m   Opioid Risk Score:   Fall Risk Score:  `1  Depression screen PHQ 2/9  Depression screen [facility] 2/9 10/15/2020 09/17/2020 03/18/2020 03/08/2017 01/11/2017 12/12/2016  Decreased Interest 0 0 1 0 0 1  Down, Depressed, Hopeless 0 0 1 0 0 1  PHQ - 2 Score 0 0 2 0 0 2  Altered sleeping - - 2 - - 1  Tired, decreased energy - - 1 - - 1  Change in appetite - - 1 - - 1  Feeling bad or failure about yourself  - - 0 - - 1  Trouble concentrating - - 0 -  - 1  Moving slowly or fidgety/restless - - 0 - - 1  Suicidal thoughts - - 0 - - 1  PHQ-9 Score - - 6 - - 9    Review of Systems  Musculoskeletal:  Positive for gait problem. Negative for back pain.       Pain in right shoulder, right arm, right hand, right leg down to foot  All other systems reviewed and are negative.     Objective:   Physical Exam Vitals and nursing note reviewed.  Constitutional:      Appearance: Normal appearance.  Cardiovascular:     Rate and Rhythm: Normal rate and regular rhythm.     Pulses: Normal pulses.     Heart sounds: Normal heart sounds.  Pulmonary:     Effort: Pulmonary effort is normal.     Breath sounds: Normal breath sounds.  Musculoskeletal:     Cervical back: Normal range of motion and neck supple.     Comments: Normal Muscle Bulk and Muscle Testing Reveals:  Upper Extremities: Full ROM and Muscle Strength 5/5 Lower Extremities: Full ROM and Muscle Strength 5/5 Arises from chair with ease Narrow Based  Gait     Skin:    General: Skin is warm and dry.  Neurological:     Mental Status: She is alert and oriented to person, place, and time.  Psychiatric:        Mood and Affect: Mood normal.        Behavior: Behavior normal.         Assessment & Plan:  1. Cervicalgia/ Cervical Radiculitis: Continue HEP as Tolerated. Continue current medication regimen. Continue to Monitor. 02/09/2021 2.Lumbar DDD/ Right Lumbar Radiculitis: Continue HEP and Continue current medication regimen. Continue to Monitor. 02/09/2021 3. Chronic Pain Syndrome. Refilled: Oxycodone 15 mg one tablet 4 times a day as needed for pain. #120.  We will continue the opioid monitoring program, this consists of regular clinic visits, examinations, urine drug screen, pill counts as well as use of [place] Controlled Substance Reporting system. A 12 month History has been reviewed on the [place] Controlled Substance Reporting System on 02/09/2021 4. Right Knee Pain:  No complaints today. Continue HEP as Tolerated. Continue to Monitor. 02/09/2021 5. Chronic Right  Shoulder Pain: Continue HEP as Tolerated. Continue to Monitor.  6. Paresthesia of Right Lower Extremity: Continue Gabapentin. Continue to Monitor.    F/U in 1 month

## 2021-02-11 LAB — TOXASSURE SELECT,+ANTIDEPR,UR

## 2021-02-12 ENCOUNTER — Encounter: Payer: Medicaid Other | Admitting: Physical Medicine and Rehabilitation

## 2021-02-12 ENCOUNTER — Other Ambulatory Visit: Payer: Self-pay

## 2021-02-12 NOTE — Progress Notes (Signed)
No answer to call and no voicemail set up

## 2021-02-16 ENCOUNTER — Telehealth: Payer: Self-pay | Admitting: *Deleted

## 2021-02-16 NOTE — Telephone Encounter (Signed)
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  This is her second positive for cocaine. She will be non narcotic treatment only if she decides to continue with this clinic.

## 2021-03-05 ENCOUNTER — Telehealth: Payer: Self-pay | Admitting: Physical Medicine and Rehabilitation

## 2021-03-05 ENCOUNTER — Other Ambulatory Visit: Payer: Self-pay

## 2021-03-05 ENCOUNTER — Encounter
Payer: Medicaid Other | Attending: Physical Medicine and Rehabilitation | Admitting: Physical Medicine and Rehabilitation

## 2021-03-05 DIAGNOSIS — F3341 Major depressive disorder, recurrent, in partial remission: Secondary | ICD-10-CM

## 2021-03-05 DIAGNOSIS — G894 Chronic pain syndrome: Secondary | ICD-10-CM | POA: Diagnosis not present

## 2021-03-05 DIAGNOSIS — F411 Generalized anxiety disorder: Secondary | ICD-10-CM | POA: Diagnosis not present

## 2021-03-05 MED ORDER — OXYCODONE HCL 15 MG PO TABS: 15.0000 mg | ORAL_TABLET | Freq: Four times a day (QID) | ORAL | 0 refills | Status: DC | PRN

## 2021-03-05 MED ORDER — DULOXETINE HCL 30 MG PO CPEP: 30.0000 mg | ORAL_CAPSULE | Freq: Every day | ORAL | 3 refills | Status: DC

## 2021-03-05 NOTE — Telephone Encounter (Signed)
Patient wanted to let Dr. Ranell Patrick know that her dentist is on [address].  She would also like to know why she doesn't come back to see her for 3 months.  Please call patient.

## 2021-03-05 NOTE — Addendum Note (Signed)
Addended by: Izora Ribas on: 03/05/2021 01:40 PM   Modules accepted: Orders, Level of Service

## 2021-03-05 NOTE — Telephone Encounter (Signed)
I spoke with Heather Perry and let her know it is her responsibility to have her dentist contact our provider if they gave her something that containes cocaine. Heather Perry is angry  about not being seen for 3 months, and says she will go to the dentist office Monday. She says she was told by Dr Ranell Patrick that it could cause it. She said, " if she does not get her prescription ya'll are going to be in big trouble".  I have verified with the toxicologist at [facility] that this would not be the source of the Benzoylecgonine.

## 2021-03-05 NOTE — Progress Notes (Addendum)
Subjective:    Patient ID: Heather Perry, female    DOB: [birth year], 55 y.o.   MRN: [id number]  HPI:  An audio/video tele-health visit is felt to be the most appropriate encounter for this patient at this time. This is a follow up tele-visit via phone. The patient is at home. MD is at office.    Heather Perry is a 55 y.o. female who returns for f/u appointment for cervical and lumbar spinal stenosis.   1) Bilateral lumbar foraminal stenosis:  -Pain has been 10/10 on average -4/10 today -she asks whether she can increase oxycodone to '15mg'$  q6H prn, which was her former dose -has been having pain right foot.  -requires refill of her gabapentin  2) Cervical stenosis:  -She states her pain is located in her neck radiating into her right shoulder and right arm with tingling burning pain.  -Also reports she has lower back pain radiating into her right lower extremity. She rates her pain 1. Her current exercise regime is walking and performing stretching exercises. -reviewed cervical spine MRI- shows diffuse spinal stenosis  -she has been trying to focus on posture -she has never tried PT.   3) HTN: BP is very well controlled today!  4) Insomnia: -gabapentin has helped her to sleep better  5) Left foot gout: she has some gout pills. The flare started Saturday- she doubled on her medications and this quelled the flare in about a day.   6) Anxiety/depression: she would like a higher dose of Cymbalta.    Pain Inventory Average Pain 10 Pain Right Now 10 My pain is constant, sharp, stabbing, and aching  In the last 24 hours, has pain interfered with the following? General activity 9 Relation with others 7 Enjoyment of life 10 What TIME of day is your pain at its worst? morning , daytime, evening, and night Sleep (in general) Poor  Pain is worse with: walking, bending, sitting, standing, and some activites Pain improves with: heat/ice and medication Relief from Meds:  good  4     Family History  Problem Relation Age of Onset   Breast cancer Maternal Aunt        unsure how old of onset   Breast cancer Maternal Aunt        unsure how old of onset   Colon cancer Neg Hx    Esophageal cancer Neg Hx    Rectal cancer Neg Hx    Stomach cancer Neg Hx    Social History   Socioeconomic History   Marital status: Single    Spouse name: Not on file   Number of children: Not on file   Years of education: Not on file   Highest education level: Not on file  Occupational History   Not on file  Tobacco Use   Smoking status: Never   Smokeless tobacco: Never  Vaping Use   Vaping Use: Never used  Substance and Sexual Activity   Alcohol use: No   Drug use: No    Types: "Crack" cocaine    Comment: last smoked 2011   Sexual activity: Not on file  Other Topics Concern   Not on file  Social History Narrative   Not on file   Social Determinants of Health   Financial Resource Strain: Not on file  Food Insecurity: Not on file  Transportation Needs: Not on file  Physical Activity: Not on file  Stress: Not on file  Social Connections: Not on file   Past Surgical  History:  Procedure Laterality Date   ABDOMINAL HYSTERECTOMY     CESAREAN SECTION     KNEE SURGERY Right    x2   UPPER GASTROINTESTINAL ENDOSCOPY     Past Medical History:  Diagnosis Date   Allergy    Anxiety    Arthritis    Asthma    Blood transfusion without reported diagnosis    Depression    GERD (gastroesophageal reflux disease)    High cholesterol    Hypertension    Obesity    Osteoporosis    Recurrent boils    Seizures ([place])    last seizure "3 years ago" per pt 06-16-16 KBW   Sleep apnea    no CPAP used   Uterine fibroid    There were no vitals taken for this visit.  Opioid Risk Score:   Fall Risk Score:  `1  Depression screen PHQ 2/9  Depression screen [facility] 2/9 02/09/2021 10/15/2020 09/17/2020 03/18/2020 03/08/2017 01/11/2017 12/12/2016  Decreased Interest 0 0 0 1 0 0 1   Down, Depressed, Hopeless 0 0 0 1 0 0 1  PHQ - 2 Score 0 0 0 2 0 0 2  Altered sleeping - - - 2 - - 1  Tired, decreased energy - - - 1 - - 1  Change in appetite - - - 1 - - 1  Feeling bad or failure about yourself  - - - 0 - - 1  Trouble concentrating - - - 0 - - 1  Moving slowly or fidgety/restless - - - 0 - - 1  Suicidal thoughts - - - 0 - - 1  PHQ-9 Score - - - 6 - - 9   Review of Systems  Constitutional: Negative.   HENT: Negative.    Eyes: Negative.   Respiratory:  Positive for shortness of breath.   Cardiovascular: Negative.   Gastrointestinal: Negative.   Endocrine: Negative.   Genitourinary: Negative.   Musculoskeletal:  Positive for arthralgias.  Allergic/Immunologic: Negative.   Neurological: Negative.   Hematological: Negative.   Psychiatric/Behavioral: Negative.        Objective:   Not performed as patient was seen via phone.       Assessment & Plan:  1. Cervicalgia/ Cervical Radiculitis: Continue HEP as Tolerated. Continue current medication regimen. Continue to Monitor.  -Discussed following foods that may reduce pain:  -Discussed following foods that may reduce pain: 1) Ginger (especially studied for arthritis)- reduce leukotriene production to decrease inflammation 2) Blueberries- high in phytonutrients that decrease inflammation 3) Salmon- marine omega-3s reduce joint swelling and pain 4) Pumpkin seeds- reduce inflammation 5) dark chocolate- reduces inflammation 6) turmeric- reduces inflammation 7) tart cherries - reduce pain and stiffness 8) extra virgin olive oil - its compound olecanthal helps to block prostaglandins  9) chili peppers- can be eaten or applied topically via capsaicin 10) mint- helpful for headache, muscle aches, joint pain, and itching 11) garlic- reduces inflammation  Link to further information on diet for chronic pain: [email or web address]   2.Lumbar DDD/  Right Lumbar Radiculitis: Continue HEP and Continue current medication regimen. Continue to Monitor.   3. Chronic Pain Syndrome. She denies use of cocaine. Had been to the dentist and lidocaine was used. Asked her to bring in her dentist report before she is due for refill on 9/22. ADDENDUM: Discussed with Sybil and as this is second positive cocaine we can no longer prescribe oxycodone for her- discussed wean with her. Will give one final script for patient to  wean off medication. Can continue to prescribe her muscle relaxers which help with her pain. 4. HTN: -Advised checking BP daily at home and logging results to bring into follow-up appointment with her PCP and myself. -Reviewed BP meds today.  -Advised regarding healthy foods that can help lower blood pressure and provided with a list: 1) citrus foods- high in vitamins and minerals 2) salmon and other fatty fish - reduces inflammation and oxylipins 3) swiss chard (leafy green)- high level of nitrates 4) pumpkin seeds- one of the best natural sources of magnesium 5) Beans and lentils- high in fiber, magnesium, and potassium 6) Berries- high in flavonoids 7) Amaranth (whole grain, can be cooked similarly to rice and oats)- high in magnesium and fiber 8) Pistachios- even more effective at reducing BP than other nuts 9) Carrots- high in phenolic compounds that relax blood vessels and reduce inflammation 10) Celery- contain phthalides that relax tissues of arterial walls 11) Tomatoes- can also improve cholesterol and reduce risk of heart disease 12) Broccoli- good source of magnesium, calcium, and potassium 13) Greek yogurt: high in potassium and calcium 14) Herbs and spices: Celery seed, cilantro, saffron, lemongrass, black cumin, ginseng, cinnamon, cardamom, sweet basil, and ginger 15) Chia and flax seeds- also help to lower cholesterol and blood sugar 16) Beets- high levels of nitrates that relax blood vessels  17) spinach and bananas-  high in potassium -Educated that goal BP is 120/80. -Made goal to incorporate some of the above foods into diet.   5) Depression/Anxiety: increase dose of Cymbalta to '30mg'$ .   6) Left foot gout: continue gout medications.   22 minutes spent in discussion of her pain, depression, anxiety, her urine toxicology report, discussion of plan to obtain report from her dentist stating date of procedure and that lidocaine was used.    F/U with me in 3 months.

## 2021-03-10 ENCOUNTER — Other Ambulatory Visit: Payer: Self-pay | Admitting: Physical Medicine and Rehabilitation

## 2021-03-12 ENCOUNTER — Telehealth: Payer: Self-pay | Admitting: Physical Medicine and Rehabilitation

## 2021-03-12 NOTE — Telephone Encounter (Signed)
Patient would like a copy of her urine drug screen.  Please call patient.

## 2021-03-12 NOTE — Telephone Encounter (Signed)
Called patient, no answer and VM not set up.

## 2021-03-12 NOTE — Telephone Encounter (Signed)
Patient called demanding her drug screen results because "she has not done no cocaine" and that we need to "call up to the lab and see what going on"

## 2021-03-15 NOTE — Telephone Encounter (Signed)
Copy of both positive drug screens mailed to Ms Mcdowell. One is a urine test and one is an oral swab, so these were performed at separate laboratories.

## 2021-03-23 ENCOUNTER — Encounter: Payer: Self-pay | Admitting: *Deleted

## 2021-03-23 NOTE — Telephone Encounter (Signed)
Opened in error

## 2021-03-26 ENCOUNTER — Telehealth: Payer: Self-pay | Admitting: *Deleted

## 2021-03-26 NOTE — Telephone Encounter (Signed)
Patient has been notified by Dr Ranell Patrick of her discharge from the clinic.

## 2021-03-26 NOTE — Telephone Encounter (Signed)
Patient called stating that "she looked up what medicine can cause a false urine test and it said Benedryl and she take it all the time and when she get to itching she take them. And don't put on her record that she had cocaine in her system because she didn't have cocaine in her system.

## 2021-04-15 ENCOUNTER — Other Ambulatory Visit: Payer: Self-pay | Admitting: Physical Medicine and Rehabilitation

## 2021-05-07 ENCOUNTER — Other Ambulatory Visit: Payer: Self-pay | Admitting: Physical Medicine and Rehabilitation

## 2021-05-14 ENCOUNTER — Other Ambulatory Visit: Payer: Self-pay | Admitting: Physical Medicine and Rehabilitation

## 2021-06-01 ENCOUNTER — Other Ambulatory Visit: Payer: Self-pay | Admitting: Physical Medicine and Rehabilitation

## 2021-06-01 ENCOUNTER — Ambulatory Visit: Payer: Medicaid Other | Admitting: Physical Medicine and Rehabilitation

## 2021-06-29 ENCOUNTER — Other Ambulatory Visit: Payer: Self-pay | Admitting: Physical Medicine and Rehabilitation

## 2021-07-03 ENCOUNTER — Ambulatory Visit (INDEPENDENT_AMBULATORY_CARE_PROVIDER_SITE_OTHER): Payer: Medicaid Other

## 2021-07-03 ENCOUNTER — Ambulatory Visit (HOSPITAL_COMMUNITY)
Admission: EM | Admit: 2021-07-03 | Discharge: 2021-07-03 | Disposition: A | Discharge status: Home / Self Care | Payer: Medicaid Other | Attending: Student | Admitting: Student

## 2021-07-03 ENCOUNTER — Other Ambulatory Visit: Payer: Self-pay

## 2021-07-03 ENCOUNTER — Encounter (HOSPITAL_COMMUNITY): Payer: Self-pay | Admitting: *Deleted

## 2021-07-03 DIAGNOSIS — M25561 Pain in right knee: Secondary | ICD-10-CM | POA: Insufficient documentation

## 2021-07-03 DIAGNOSIS — M1990 Unspecified osteoarthritis, unspecified site: Secondary | ICD-10-CM | POA: Insufficient documentation

## 2021-07-03 DIAGNOSIS — M25572 Pain in left ankle and joints of left foot: Secondary | ICD-10-CM | POA: Diagnosis not present

## 2021-07-03 DIAGNOSIS — R531 Weakness: Secondary | ICD-10-CM | POA: Insufficient documentation

## 2021-07-03 DIAGNOSIS — M5136 Other intervertebral disc degeneration, lumbar region: Secondary | ICD-10-CM | POA: Diagnosis not present

## 2021-07-03 DIAGNOSIS — G894 Chronic pain syndrome: Secondary | ICD-10-CM | POA: Insufficient documentation

## 2021-07-03 DIAGNOSIS — S8262XA Displaced fracture of lateral malleolus of left fibula, initial encounter for closed fracture: Secondary | ICD-10-CM | POA: Diagnosis not present

## 2021-07-03 DIAGNOSIS — R569 Unspecified convulsions: Secondary | ICD-10-CM | POA: Diagnosis not present

## 2021-07-03 DIAGNOSIS — S82892A Other fracture of left lower leg, initial encounter for closed fracture: Secondary | ICD-10-CM | POA: Diagnosis present

## 2021-07-03 DIAGNOSIS — W19XXXA Unspecified fall, initial encounter: Secondary | ICD-10-CM | POA: Diagnosis not present

## 2021-07-03 DIAGNOSIS — Z96651 Presence of right artificial knee joint: Secondary | ICD-10-CM | POA: Diagnosis present

## 2021-07-03 DIAGNOSIS — U071 COVID-19: Secondary | ICD-10-CM | POA: Insufficient documentation

## 2021-07-03 DIAGNOSIS — M810 Age-related osteoporosis without current pathological fracture: Secondary | ICD-10-CM | POA: Diagnosis not present

## 2021-07-03 LAB — SARS CORONAVIRUS 2 (TAT 6-24 HRS): SARS Coronavirus 2: POSITIVE — AB

## 2021-07-03 MED ORDER — WALKER MISC: 1.0000 | Freq: Every day | 0 refills | Status: DC | PRN

## 2021-07-03 NOTE — ED Triage Notes (Signed)
Pt reports 2 days  the Rt knee gave out which caused her to fall . Pt now has pain to Rt knee ,which is a replacement and pain with swelling to Lt foot.

## 2021-07-03 NOTE — Discharge Instructions (Addendum)
-  You have a small left ankle fracture. -Oxycodone for pain - from your current prescription. -Knee brace, boot, rest, ice, elevation, tylenol/ibuprofen -Always use your knee brace and boot while standing and walking. Limit weight on L foot.  -Call your primary care doctor on Monday; they can send a referral to Ortho if symptoms persist.

## 2021-07-03 NOTE — ED Provider Notes (Signed)
[place]    CSN: [id number] Arrival date & time: 07/03/21  1002      History   Chief Complaint Chief Complaint  Patient presents with   Fall   Knee Pain    RT   Foot Injury    LT    HPI Heather Perry is a 56 y.o. female presenting with MSK pain of multiple joints following fall. History chronic pain syndrome, arthritis, osteoporosis, seizures, lumbar DDD, R knee replacement 2014.  States 2 days ago, her left ankle inverted while walking, causing her to fall directly onto the right patella.  Denies pain or injury elsewhere.  Now with R knee pain and L foot swelling.  States the right knee keeps giving out now, which concerns her given history R knee replacement. Has attempted ankle brace.   HPI  Past Medical History:  Diagnosis Date   Allergy    Anxiety    Arthritis    Asthma    Blood transfusion without reported diagnosis    Depression    GERD (gastroesophageal reflux disease)    High cholesterol    Hypertension    Obesity    Osteoporosis    Recurrent boils    Seizures ([place])    last seizure "3 years ago" per pt 06-16-16 KBW   Sleep apnea    no CPAP used   Uterine fibroid     Patient Active Problem List   Diagnosis Date Noted   Lumbar degenerative disc disease 07/28/2020   Anxiety disorder 06/28/2017   MDD (major depressive disorder), recurrent episode, mild ([place]) 06/28/2017   Essential hypertension 06/26/2017   Hyperlipidemia with target LDL less than 100 06/26/2017   Chest pain with low risk for cardiac etiology 06/26/2017   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      Home Medications    Prior to Admission medications    Medication Sig Start Date End Date Taking? Authorizing Provider  acyclovir ointment (ZOVIRAX) 5 % Apply 1 application topically every 3 (three) hours. 05/18/16   Micheline Chapman, NP  AMITIZA 24 MCG capsule TAKE 1 CAPSULE (24 MCG TOTAL) BY MOUTH 2 (TWO) TIMES DAILY WITH A MEAL. 03/11/21   Raulkar, Clide Deutscher, MD  amLODipine (NORVASC) 5 MG tablet TAKE 1 TABLET (5 MG TOTAL) BY MOUTH DAILY. 11/13/20   Raulkar, Clide Deutscher, MD  aspirin EC 81 MG tablet Take 81 mg by mouth daily.    [provider]  benazepril-hydrochlorthiazide (LOTENSIN HCT) 20-12.5 MG tablet Take 1 tablet by mouth daily. 06/09/20   Raulkar, Clide Deutscher, MD  budesonide-formoterol (SYMBICORT) 160-4.5 MCG/ACT inhaler Inhale 2 puffs into the lungs 2 (two) times daily. Patient taking differently: Inhale 2 puffs into the lungs 2 (two) times daily as needed (wheezing, sob). 05/18/16   Micheline Chapman, NP  celecoxib (CELEBREX) 200 MG capsule TAKE 1 CAPSULE (200 MG TOTAL) BY MOUTH 2 (TWO) TIMES DAILY. 03/11/21   Raulkar, Clide Deutscher, MD  cetirizine (ZYRTEC) 10 MG tablet Take 1 tablet (10 mg total) by mouth daily. 04/29/20   Raulkar, Clide Deutscher, MD  cyclobenzaprine (FLEXERIL) 10 MG tablet TAKE 1 TABLET (10  MG TOTAL) BY MOUTH 3 (THREE) TIMES DAILY AS NEEDED FOR MUSCLE SPASMS. 01/05/21   Raulkar, Clide Deutscher, MD  cyclobenzaprine (FLEXERIL) 5 MG tablet TAKE 1 TABLET (5 MG TOTAL) BY MOUTH 3 (THREE) TIMES DAILY AS NEEDED FOR MUSCLE SPASMS. 03/11/21   Raulkar, Clide Deutscher, MD  diclofenac Sodium (VOLTAREN) 1 % GEL APPLY 2 GRAMS TOPICALLY 4 (FOUR) TIMES DAILY. 03/11/21   Raulkar, Clide Deutscher, MD  DULoxetine (CYMBALTA) 20 MG capsule TAKE 1 CAPSULE (20 MG TOTAL) BY MOUTH DAILY. 03/11/21 03/11/22  Raulkar, Clide Deutscher, MD  DULoxetine (CYMBALTA) 30 MG capsule Take 1 capsule (30 mg total) by mouth daily. 03/05/21   Raulkar, Clide Deutscher, MD  ergocalciferol (VITAMIN D2) 1.25 MG (50000 UT) capsule Take 1 capsule (50,000 Units total) by mouth once a week. 03/19/20   Raulkar,  Clide Deutscher, MD  esomeprazole (NEXIUM) 40 MG capsule TAKE ONE CAPSULE BY MOUTH ONCE DAILY 03/11/21   Raulkar, Clide Deutscher, MD  fluticasone (FLONASE) 50 MCG/ACT nasal spray PLACE 2 SPRAYS INTO BOTH NOSTRILS DAILY. 03/11/21   Raulkar, Clide Deutscher, MD  gabapentin (NEURONTIN) 400 MG capsule Take 1 capsule (400 mg total) by mouth 3 (three) times daily. 01/07/21   Raulkar, Clide Deutscher, MD  gabapentin (NEURONTIN) 800 MG tablet Take by mouth.    [provider]  hydrochlorothiazide (HYDRODIURIL) 25 MG tablet Take 1 tablet (25 mg total) by mouth daily. 05/18/16   Micheline Chapman, NP  hydroxychloroquine (PLAQUENIL) 200 MG tablet Take 1 tablet by mouth 2 (two) times daily.  03/21/17   [provider]  hydrOXYzine (ATARAX/VISTARIL) 25 MG tablet Take 1 tablet (25 mg total) by mouth every 6 (six) hours as needed for itching. 08/19/20   Sharion Balloon, NP  ipratropium-albuterol (DUONEB) 0.5-2.5 (3) MG/3ML SOLN Take 3 mLs by nebulization every 6 (six) hours as needed. Patient taking differently: Take 3 mLs by nebulization every 6 (six) hours as needed (for SOB). 05/18/16   Micheline Chapman, NP  leflunomide (ARAVA) 20 MG tablet Take by mouth. 11/11/19   [provider]  Misc. Devices (WALKER) MISC 1 Device by Does not apply route daily as needed. 07/03/21   Hazel Sams, PA-C  mupirocin cream (BACTROBAN) 2 % Apply 1 application topically 2 (two) times daily. To breast 04/13/17   Steve Rattler, DO  olmesartan-hydrochlorothiazide (BENICAR HCT) 40-25 MG tablet Take 1 tablet by mouth daily. 03/10/20   [provider]  oxyCODONE (ROXICODONE) 15 MG immediate release tablet Take 1 tablet (15 mg total) by mouth 4 (four) times daily as needed for pain. 03/05/21   Raulkar, Clide Deutscher, MD  phenytoin (DILANTIN) 100 MG ER capsule Take 1 capsule (100 mg total) by mouth 3 (three) times daily. 05/18/16   Micheline Chapman, NP  pravastatin (PRAVACHOL) 20 MG tablet Take 1 tablet (20 mg total) by mouth  daily. 05/18/16   Micheline Chapman, NP  PROAIR HFA [phone] Base) MCG/ACT inhaler INHALE 2 PUFFS BY MOUTH EVERY 6 HOURS AS NEEDED FOR SHORTNESS OF BREATH OR WHEEZING. 03/11/21   Raulkar, Clide Deutscher, MD  triamcinolone cream (KENALOG) 0.1 % Apply topically.    [provider]    Family History Family History  Problem Relation Age of Onset   Breast cancer Maternal Aunt        unsure how old of onset   Breast cancer Maternal Aunt        unsure how old of onset   Colon cancer Neg Hx  Esophageal cancer Neg Hx    Rectal cancer Neg Hx    Stomach cancer Neg Hx     Social History Social History   Tobacco Use   Smoking status: Never   Smokeless tobacco: Never  Vaping Use   Vaping Use: Never used  Substance Use Topics   Alcohol use: No   Drug use: No    Types: "Crack" cocaine    Comment: last smoked 2011     Allergies   Hydrocodone-acetaminophen, Lortab [hydrocodone-acetaminophen], Niacin, Penicillin g, and Penicillins   Review of Systems Review of Systems  Musculoskeletal:        R knee pain  L ankle and foot pain  All other systems reviewed and are negative.   Physical Exam Triage Vital Signs ED Triage Vitals  Enc Vitals Group     BP      Pulse      Resp      Temp      Temp src      SpO2      Weight      Height      Head Circumference      Peak Flow      Pain Score      Pain Loc      Pain Edu?      Excl. in [place]?    No data found.  Updated Vital Signs BP 138/65    Pulse (!) 101    Temp 98.2 F (36.8 C)    Resp 20    SpO2 100%   Visual Acuity Right Eye Distance:   Left Eye Distance:   Bilateral Distance:    Right Eye Near:   Left Eye Near:    Bilateral Near:     Physical Exam Vitals reviewed.  Constitutional:      General: She is not in acute distress.    Appearance: Normal appearance. She is not ill-appearing.  HENT:     Head: Normocephalic and atraumatic.  Cardiovascular:     Rate and Rhythm: Normal rate and regular rhythm.      Heart sounds: Normal heart sounds.  Pulmonary:     Effort: Pulmonary effort is normal.     Breath sounds: Normal breath sounds and air entry.  Abdominal:     Tenderness: There is no abdominal tenderness. There is no right CVA tenderness, left CVA tenderness, guarding or rebound.  Musculoskeletal:     Cervical back: Normal range of motion. No swelling, deformity, signs of trauma, rigidity, spasms, tenderness, bony tenderness or crepitus. No pain with movement.     Thoracic back: No swelling, deformity, signs of trauma, spasms, tenderness or bony tenderness. Normal range of motion. No scoliosis.     Lumbar back: No swelling, deformity, signs of trauma, spasms, tenderness or bony tenderness. Normal range of motion. Negative right straight leg raise test and negative left straight leg raise test. No scoliosis.     Comments: No paraspinous tenderness. No midline spinous tenderness, deformity, stepoff. No hip or pelvic instability.   R knee - well healed surgical scar. Small abrasion patella with no bleeding or surrounding skin changes. ROM flexion and extension intact but with some pain with extension. No joint laxity. In wheelchair due to pain and instability.  L ankle - TTP lateral malleolus with some effusion. TTP dorsum midfoot. ROM ankle and toes intact but with pain. No obvious bony deformity. DP 2+, cap refill <2 seconds.   No snuffbox tenderness.  Absolutely no other injury, deformity,  tenderness, ecchymosis, abrasion.  Neurological:     General: No focal deficit present.     Mental Status: She is alert.     Cranial Nerves: No cranial nerve deficit.  Psychiatric:        Mood and Affect: Mood normal.        Behavior: Behavior normal.        Thought Content: Thought content normal.        Judgment: Judgment normal.     UC Treatments / Results  Labs (all labs ordered are listed, but only abnormal results are displayed) Labs Reviewed  SARS CORONAVIRUS 2 (TAT 6-24 HRS)     EKG   Radiology DG Ankle Complete Left  Result Date: 07/03/2021 CLINICAL DATA:  Recent fall, pain EXAM: LEFT ANKLE COMPLETE - 3+ VIEW COMPARISON:  None. FINDINGS: No recent fracture or dislocation is seen. There is soft tissue swelling around the ankle, more so over the lateral malleolus. IMPRESSION: No recent fracture or dislocation is seen in the left ankle. Electronically Signed   By: Elmer Picker M.D.   On: 07/03/2021 10:43   DG Knee Complete 4 Views Right  Result Date: 07/03/2021 CLINICAL DATA:  Right patellar tenderness after fall EXAM: RIGHT KNEE - COMPLETE 4+ VIEW COMPARISON:  05/10/2019 FINDINGS: Total knee arthroplasty. No acute fracture, subluxation, or suprapatellar joint effusion. Medial femoral condyle spurring from old collateral ligament injury. IMPRESSION: No acute finding. Total knee arthroplasty. Electronically Signed   By: Jorje Guild M.D.   On: 07/03/2021 11:31   DG Foot Complete Left  Result Date: 07/03/2021 CLINICAL DATA:  Low midfoot tenderness after fall EXAM: LEFT FOOT - COMPLETE 3+ VIEW COMPARISON:  None. FINDINGS: Limited frontal view projection. Tiny avulsion fracture at the lateral malleolus best seen on the oblique view. Soft tissue swelling especially at the lateral ankle. IMPRESSION: Tiny avulsion fracture at the lateral malleolus. Electronically Signed   By: Jorje Guild M.D.   On: 07/03/2021 11:28    Procedures Procedures (including critical care time)  Medications Ordered in UC Medications - No data to display  Initial Impression / Assessment and Plan / UC Course  I have reviewed the triage vital signs and the nursing notes.  Pertinent labs & imaging results that were available during my care of the patient were reviewed by me and considered in my medical decision making (see chart for details).     This patient is a very pleasant 56 y.o. year old female presenting with L ankle fracture. Neurovascularly intact.  History R knee  replacement 2014, she does have knee pain today.  Xray R knee - negative Xray L foot - negative Xray L ankle - Tiny avulsion fracture at the lateral malleolus.  PDMP reviewed- shows that she received 120.00 oxycodone on 06/12/21. She can continue this for pain.  Knee brace, CAM boot. Also sent prescription for walker.   F/u with ortho for R knee and L ankle- she will have PCP send referral given medicaid insurance..  Also requesting covid screening - sent. No symptoms.   ED return precautions discussed. Patient verbalizes understanding and agreement.     Final Clinical Impressions(s) / UC Diagnoses   Final diagnoses:  Closed fracture of malleolus of left ankle, initial encounter  History of total right knee replacement  Fall, initial encounter     Discharge Instructions      -You have a small left ankle fracture. -Oxycodone for pain - from your current prescription. -Knee brace, boot, rest, ice, elevation,  tylenol/ibuprofen -Always use your knee brace and boot while standing and walking. Limit weight on L foot.  -Call your primary care doctor on Monday; they can send a referral to Ortho if symptoms persist.      ED Prescriptions     Medication Sig Dispense Auth. Provider   Misc. Devices (WALKER) MISC  (Status: Discontinued) 1 Device by Does not apply route daily as needed. 1 each Hazel Sams, PA-C   Misc. Devices (WALKER) MISC 1 Device by Does not apply route daily as needed. 1 each Hazel Sams, PA-C      I have reviewed the PDMP during this encounter.   Hazel Sams, PA-C 07/03/21 1219

## 2021-07-12 ENCOUNTER — Other Ambulatory Visit: Payer: Self-pay | Admitting: Physical Medicine and Rehabilitation

## 2021-07-29 ENCOUNTER — Emergency Department (HOSPITAL_COMMUNITY): Payer: Medicaid Other

## 2021-07-29 ENCOUNTER — Other Ambulatory Visit: Payer: Self-pay

## 2021-07-29 ENCOUNTER — Encounter (HOSPITAL_COMMUNITY): Payer: Self-pay | Admitting: Emergency Medicine

## 2021-07-29 ENCOUNTER — Emergency Department (HOSPITAL_COMMUNITY)
Admission: EM | Admit: 2021-07-29 | Discharge: 2021-07-30 | Disposition: A | Discharge status: Home / Self Care | Payer: Medicaid Other | Attending: Emergency Medicine | Admitting: Emergency Medicine

## 2021-07-29 ENCOUNTER — Ambulatory Visit (HOSPITAL_COMMUNITY)
Admission: EM | Admit: 2021-07-29 | Discharge: 2021-07-29 | Payer: Medicaid Other | Attending: Internal Medicine | Admitting: Internal Medicine

## 2021-07-29 DIAGNOSIS — I10 Essential (primary) hypertension: Secondary | ICD-10-CM | POA: Diagnosis not present

## 2021-07-29 DIAGNOSIS — R079 Chest pain, unspecified: Secondary | ICD-10-CM

## 2021-07-29 DIAGNOSIS — Z79899 Other long term (current) drug therapy: Secondary | ICD-10-CM | POA: Insufficient documentation

## 2021-07-29 DIAGNOSIS — Z7982 Long term (current) use of aspirin: Secondary | ICD-10-CM | POA: Diagnosis not present

## 2021-07-29 LAB — BASIC METABOLIC PANEL
Anion gap: 11 (ref 5–15)
BUN: 12 mg/dL (ref 6–20)
CO2: 27 mmol/L (ref 22–32)
Calcium: 9.2 mg/dL (ref 8.9–10.3)
Chloride: 98 mmol/L (ref 98–111)
Creatinine, Ser: 0.71 mg/dL (ref 0.44–1.00)
GFR, Estimated: >60 mL/min (ref >60)
Glucose, Bld: 94 mg/dL (ref 70–99)
Potassium: 3.5 mmol/L (ref 3.5–5.1)
Sodium: 136 mmol/L (ref 135–145)

## 2021-07-29 LAB — CBC
HCT: 41.4 % (ref 36.0–46.0)
Hemoglobin: 13.6 g/dL (ref 12.0–15.0)
MCH: 30.7 pg (ref 26.0–34.0)
MCHC: 32.9 g/dL (ref 30.0–36.0)
MCV: 93.5 fL (ref 80.0–100.0)
Platelets: 149 10*3/uL — ABNORMAL LOW (ref 150–400)
RBC: 4.43 MIL/uL (ref 3.87–5.11)
RDW: 13.9 % (ref 11.5–15.5)
WBC: 7.5 10*3/uL (ref 4.0–10.5)
nRBC: 0 % (ref 0.0–0.2)

## 2021-07-29 LAB — TROPONIN I (HIGH SENSITIVITY)
Troponin I (High Sensitivity): 3 ng/L (ref <18)
Troponin I (High Sensitivity): 3 ng/L (ref <18)

## 2021-07-29 NOTE — Discharge Instructions (Addendum)
Given your risk factors and clinical presentation please go to the emergency department for further evaluation.

## 2021-07-29 NOTE — ED Triage Notes (Signed)
Pt here sent form UC for chest pain for the last 5 days , pain in the center of her chest ands radiates to each side

## 2021-07-29 NOTE — ED Triage Notes (Signed)
Chest pain for one week.  Pain is sharp.  Pain is intermittent.  Pain is worse when lying down.  Though to be acid reflux, tums did not help.  Patient has taken aspirin too.  No nausea or vomiting.   Complains of hot flashes since September.    Patient no longer has a pcp-dismissed .

## 2021-07-29 NOTE — ED Provider Triage Note (Signed)
Emergency Medicine Provider Triage Evaluation Note  Heather Perry , a 56 y.o. female  was evaluated in triage.  Pt complains of midsternal chest pain x5 days.  Denies shortness of breath (states she is an asthmatic), nausea, vomiting.  Reports breaking out in a sweat at 1 point.  Pain is located mid sternum as well as under both breasts bilaterally.  Review of Systems  Positive: CP Negative: Nausea, vomiting, abdominal pain  Physical Exam  BP 134/65 (BP Location: Left Arm)    Pulse (!) 110    Temp 97.9 F (36.6 C) (Oral)    Resp 19    SpO2 98%  Gen:   Awake, no distress   Resp:  Normal effort, mild wheezing MSK:   Moves extremities without difficulty  Other:    Medical Decision Making  Medically screening exam initiated at 6:25 PM.  Appropriate orders placed.  Heather Perry was informed that the remainder of the evaluation will be completed by another provider, this initial triage assessment does not replace that evaluation, and the importance of remaining in the ED until their evaluation is complete.     Tacy Learn, PA-C 07/29/21 1826

## 2021-07-30 ENCOUNTER — Encounter (HOSPITAL_COMMUNITY): Payer: Self-pay | Admitting: Emergency Medicine

## 2021-07-30 MED ORDER — ONDANSETRON 4 MG PO TBDP
4.0000 mg | ORAL_TABLET | Freq: Once | ORAL | Status: AC | PRN
  Administered 2021-07-30: 4 mg via ORAL
  Filled 2021-07-30: qty 1

## 2021-07-30 MED ORDER — ESOMEPRAZOLE MAGNESIUM 40 MG PO CPDR: 40.0000 mg | DELAYED_RELEASE_CAPSULE | Freq: Two times a day (BID) | ORAL | 1 refills | Status: DC

## 2021-07-30 MED ORDER — LIDOCAINE VISCOUS HCL 2 % MT SOLN
15.0000 mL | Freq: Once | OROMUCOSAL | Status: AC
  Administered 2021-07-30: 15 mL via OROMUCOSAL
  Filled 2021-07-30: qty 15

## 2021-07-30 MED ORDER — ALUM & MAG HYDROXIDE-SIMETH 200-200-20 MG/5ML PO SUSP
15.0000 mL | Freq: Once | ORAL | Status: AC
  Administered 2021-07-30: 15 mL via ORAL
  Filled 2021-07-30: qty 30

## 2021-07-30 MED ORDER — FAMOTIDINE 20 MG PO TABS
20.0000 mg | ORAL_TABLET | Freq: Once | ORAL | Status: AC
  Administered 2021-07-30: 20 mg via ORAL
  Filled 2021-07-30: qty 1

## 2021-07-30 MED ORDER — PANTOPRAZOLE SODIUM 40 MG PO TBEC
80.0000 mg | DELAYED_RELEASE_TABLET | Freq: Every day | ORAL | Status: DC
  Administered 2021-07-30: 80 mg via ORAL
  Filled 2021-07-30: qty 2

## 2021-07-30 MED ORDER — SUCRALFATE 1 G PO TABS: 1.0000 g | ORAL_TABLET | Freq: Three times a day (TID) | ORAL | 0 refills | Status: DC

## 2021-07-30 NOTE — ED Provider Notes (Signed)
[facility] EMERGENCY DEPARTMENT Provider Note   CSN: [id number] Arrival date & time: 07/29/21  1716     History  Chief Complaint  Patient presents with   Chest Pain    Heather Perry is a 56 y.o. female.  56 year old female with a past medical history of hypertension, indigestion, allergies, seizures and depression who presents emerged from today with chest pain.  Patient states that she felt the sharp chest pain in her central chest for the last week or so.  Symptoms goes towards the left sometimes towards the right.  She drinks 2 beers a night but that is not abnormal for her.  No recent changes in her diet but states that the acidic and spicy foods will cause this to get worse.  She states that she had multiple episodes in the past and been told that she has indigestion.  She stopped her Nexium a couple weeks ago because she does not have a primary doctor and ran out of the prescription.  She states that she has hot flashes that sometimes exacerbated by the chest pain.  She has no significant shortness of breath, nausea, vomiting, lightheadedness or clamminess. Had some tums which seemed to help.    Chest Pain     Home Medications Prior to Admission medications   Medication Sig Start Date End Date Taking? Authorizing Provider  sucralfate (CARAFATE) 1 g tablet Take 1 tablet (1 g total) by mouth 4 (four) times daily -  with meals and at bedtime. 07/30/21  Yes Dayanis Bergquist, Corene Cornea, MD  acyclovir ointment (ZOVIRAX) 5 % Apply 1 application topically every 3 (three) hours. 05/18/16   Micheline Chapman, NP  AMITIZA 24 MCG capsule TAKE 1 CAPSULE (24 MCG TOTAL) BY MOUTH 2 (TWO) TIMES DAILY WITH A MEAL. 03/11/21   Raulkar, Clide Deutscher, MD  amLODipine (NORVASC) 5 MG tablet TAKE 1 TABLET (5 MG TOTAL) BY MOUTH DAILY. 11/13/20   Raulkar, Clide Deutscher, MD  aspirin EC 81 MG tablet Take 81 mg by mouth daily.    [provider]  budesonide-formoterol (SYMBICORT) 160-4.5 MCG/ACT inhaler  Inhale 2 puffs into the lungs 2 (two) times daily. Patient taking differently: Inhale 2 puffs into the lungs 2 (two) times daily as needed (wheezing, sob). 05/18/16   Micheline Chapman, NP  celecoxib (CELEBREX) 200 MG capsule TAKE 1 CAPSULE (200 MG TOTAL) BY MOUTH 2 (TWO) TIMES DAILY. 03/11/21   Raulkar, Clide Deutscher, MD  cetirizine (ZYRTEC) 10 MG tablet Take 1 tablet (10 mg total) by mouth daily. 04/29/20   Raulkar, Clide Deutscher, MD  cyclobenzaprine (FLEXERIL) 10 MG tablet TAKE 1 TABLET (10 MG TOTAL) BY MOUTH 3 (THREE) TIMES DAILY AS NEEDED FOR MUSCLE SPASMS. 01/05/21   Raulkar, Clide Deutscher, MD  cyclobenzaprine (FLEXERIL) 5 MG tablet TAKE 1 TABLET (5 MG TOTAL) BY MOUTH 3 (THREE) TIMES DAILY AS NEEDED FOR MUSCLE SPASMS. 03/11/21   Raulkar, Clide Deutscher, MD  diclofenac Sodium (VOLTAREN) 1 % GEL APPLY 2 GRAMS TOPICALLY 4 (FOUR) TIMES DAILY. 03/11/21   Raulkar, Clide Deutscher, MD  DULoxetine (CYMBALTA) 20 MG capsule TAKE 1 CAPSULE (20 MG TOTAL) BY MOUTH DAILY. 03/11/21 03/11/22  Raulkar, Clide Deutscher, MD  DULoxetine (CYMBALTA) 30 MG capsule Take 1 capsule (30 mg total) by mouth daily. 03/05/21   Raulkar, Clide Deutscher, MD  ergocalciferol (VITAMIN D2) 1.25 MG (50000 UT) capsule Take 1 capsule (50,000 Units total) by mouth once a week. 03/19/20   Raulkar, Clide Deutscher, MD  esomeprazole (NEXIUM) 40 MG  capsule Take 1 capsule (40 mg total) by mouth 2 (two) times daily before a meal. 07/30/21 08/29/21  Jomarie Gellis, Corene Cornea, MD  fluticasone (FLONASE) 50 MCG/ACT nasal spray PLACE 2 SPRAYS INTO BOTH NOSTRILS DAILY. 03/11/21   Raulkar, Clide Deutscher, MD  gabapentin (NEURONTIN) 400 MG capsule Take 1 capsule (400 mg total) by mouth 3 (three) times daily. 01/07/21   Raulkar, Clide Deutscher, MD  gabapentin (NEURONTIN) 800 MG tablet Take by mouth.    [provider]  hydrochlorothiazide (HYDRODIURIL) 25 MG tablet Take 1 tablet (25 mg total) by mouth daily. 05/18/16   Micheline Chapman, NP  hydroxychloroquine (PLAQUENIL) 200 MG tablet Take 1 tablet by mouth 2  (two) times daily.  03/21/17   [provider]  hydrOXYzine (ATARAX/VISTARIL) 25 MG tablet Take 1 tablet (25 mg total) by mouth every 6 (six) hours as needed for itching. 08/19/20   Sharion Balloon, NP  ipratropium-albuterol (DUONEB) 0.5-2.5 (3) MG/3ML SOLN Take 3 mLs by nebulization every 6 (six) hours as needed. Patient taking differently: Take 3 mLs by nebulization every 6 (six) hours as needed (for SOB). 05/18/16   Micheline Chapman, NP  leflunomide (ARAVA) 20 MG tablet Take by mouth. 11/11/19   [provider]  Misc. Devices (WALKER) MISC 1 Device by Does not apply route daily as needed. 07/03/21   Hazel Sams, PA-C  mupirocin cream (BACTROBAN) 2 % Apply 1 application topically 2 (two) times daily. To breast 04/13/17   Steve Rattler, DO  oxyCODONE (ROXICODONE) 15 MG immediate release tablet Take 1 tablet (15 mg total) by mouth 4 (four) times daily as needed for pain. 03/05/21   Raulkar, Clide Deutscher, MD  phenytoin (DILANTIN) 100 MG ER capsule Take 1 capsule (100 mg total) by mouth 3 (three) times daily. 05/18/16   Micheline Chapman, NP  pravastatin (PRAVACHOL) 20 MG tablet Take 1 tablet (20 mg total) by mouth daily. 05/18/16   Micheline Chapman, NP  PROAIR HFA [phone] Base) MCG/ACT inhaler INHALE 2 PUFFS BY MOUTH EVERY 6 HOURS AS NEEDED FOR SHORTNESS OF BREATH OR WHEEZING. 03/11/21   Raulkar, Clide Deutscher, MD  triamcinolone cream (KENALOG) 0.1 % Apply topically.    [provider]      Allergies    Hydrocodone-acetaminophen, Lortab [hydrocodone-acetaminophen], Niacin, Penicillin g, and Penicillins    Review of Systems   Review of Systems  Cardiovascular:  Positive for chest pain.   Physical Exam Updated Vital Signs BP (!) 141/82 (BP Location: Left Arm)    Pulse (!) 102    Temp 98.1 F (36.7 C) (Oral)    Resp 18    SpO2 96%  Physical Exam Vitals and nursing note reviewed.  Constitutional:      Appearance: She is well-developed.  HENT:     Head: Normocephalic  and atraumatic.  Cardiovascular:     Rate and Rhythm: Normal rate and regular rhythm.  Pulmonary:     Effort: No respiratory distress.     Breath sounds: No stridor.  Chest:     Chest wall: No mass or deformity.  Abdominal:     General: There is no distension or abdominal bruit.     Palpations: Abdomen is soft.  Musculoskeletal:        General: Normal range of motion.     Cervical back: Normal range of motion.     Right lower leg: No edema.     Left lower leg: No edema.  Skin:    General:  Skin is warm and dry.  Neurological:     Mental Status: She is alert.    ED Results / Procedures / Treatments   Labs (all labs ordered are listed, but only abnormal results are displayed) Labs Reviewed  CBC - Abnormal; Notable for the following components:      Result Value   Platelets 149 (*)    All other components within normal limits  BASIC METABOLIC PANEL  TROPONIN I (HIGH SENSITIVITY)  TROPONIN I (HIGH SENSITIVITY)    EKG None  Radiology DG Chest 2 View  Result Date: 07/29/2021 CLINICAL DATA:  Sharp intermittent chest pain x1 week EXAM: CHEST - 2 VIEW COMPARISON:  July 17, 2019 FINDINGS: The heart size and mediastinal contours are within normal limits. No focal consolidation. No pleural effusion. No pneumothorax. The visualized skeletal structures are unremarkable. IMPRESSION: No acute cardiopulmonary disease. Electronically Signed   By: Dahlia Bailiff M.D.   On: 07/29/2021 18:59    Procedures Procedures    Medications Ordered in ED Medications  pantoprazole (PROTONIX) EC tablet 80 mg (80 mg Oral Given 07/30/21 0219)  lidocaine (XYLOCAINE) 2 % viscous mouth solution 15 mL (15 mLs Mouth/Throat Given 07/30/21 0217)  alum & mag hydroxide-simeth (MAALOX/MYLANTA) 200-200-20 MG/5ML suspension 15 mL (15 mLs Oral Given 07/30/21 0218)  famotidine (PEPCID) tablet 20 mg (20 mg Oral Given 07/30/21 0219)  ondansetron (ZOFRAN-ODT) disintegrating tablet 4 mg (4 mg Oral Given 07/30/21 0228)     ED Course/ Medical Decision Making/ A&P                           Medical Decision Making Risk OTC drugs. Prescription drug management.   Patient's symptoms seem consistent with esophagitis or gastritis.  We will give her medications here and prescribe the same at home.  She does not have a primary doctor at the time so we will need to send her home with a list of primary doctors in the area.  May need GI follow-up if not improving.  Low suspicion for ACS with normal EKG and negative troponins and classic description for indigestion.  She is slightly tachycardic however I also think PE is unlikely as she does not have any shortness of breath, hypoxia, tachypnea.  No trauma or other concerns to suggest the need for hospitalization or further imaging at this time  Treated for indigestion. Felt a lot better. Requested discharge.    Final Clinical Impression(s) / ED Diagnoses Final diagnoses:  Nonspecific chest pain    Rx / DC Orders ED Discharge Orders          Ordered    sucralfate (CARAFATE) 1 g tablet  3 times daily with meals & bedtime        07/30/21 0319    esomeprazole (NEXIUM) 40 MG capsule  2 times daily before meals,   Status:  Discontinued        07/30/21 0319    esomeprazole (NEXIUM) 40 MG capsule  2 times daily before meals        07/30/21 0319              Pam Vanalstine, Corene Cornea, MD 07/30/21 3094

## 2021-07-30 NOTE — ED Notes (Signed)
Pt verbalizes understanding of d/c instructions. Prescriptions reviewed with patient. Pt ambulatory at d/c with all belongings.  

## 2021-07-30 NOTE — ED Provider Notes (Signed)
[facility]    CSN: [id number] Arrival date & time: 07/29/21  1444      History   Chief Complaint Chief Complaint  Patient presents with   Chest Pain    HPI Heather Perry is a 56 y.o. female comes to the urgent care with a 1 week history of precordial chest pain.  Patient describes the chest pain as sharp, in the precordial region and aggravated by laying down.  Patient has a history of reflux and has tried Tums with no improvement in the symptoms.  She denies any nausea or vomiting.  Patient is unable to endorse radiation of pain to the shoulders because she has chronic bilateral shoulder pain.  She endorses breaking out in cold sweats.  No dizziness, near syncope or syncopal episodes.  No fever or chills.  No cough or sputum production.  No trauma to the chest or falls.  Patient endorses family history of heart disease.  HPI  Past Medical History:  Diagnosis Date   Allergy    Anxiety    Arthritis    Asthma    Blood transfusion without reported diagnosis    Depression    GERD (gastroesophageal reflux disease)    High cholesterol    Hypertension    Obesity    Osteoporosis    Recurrent boils    Seizures ([place])    last seizure "3 years ago" per pt 06-16-16 KBW   Sleep apnea    no CPAP used   Uterine fibroid     Patient Active Problem List   Diagnosis Date Noted   Lumbar degenerative disc disease 07/28/2020   Anxiety disorder 06/28/2017   MDD (major depressive disorder), recurrent episode, mild ([place]) 06/28/2017   Essential hypertension 06/26/2017   Hyperlipidemia with target LDL less than 100 06/26/2017   Chest pain with low risk for cardiac etiology 06/26/2017   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      Home Medications    Prior to Admission medications   Medication Sig Start Date End Date Taking? Authorizing Provider  acyclovir ointment (ZOVIRAX) 5 % Apply 1 application topically every 3 (three) hours. 05/18/16   Micheline Chapman, NP  AMITIZA 24 MCG capsule TAKE 1 CAPSULE (24 MCG TOTAL) BY MOUTH 2 (TWO) TIMES DAILY WITH A MEAL. 03/11/21   Raulkar, Clide Deutscher, MD  amLODipine (NORVASC) 5 MG tablet TAKE 1 TABLET (5 MG TOTAL) BY MOUTH DAILY. 11/13/20   Raulkar, Clide Deutscher, MD  aspirin EC 81 MG tablet Take 81 mg by mouth daily.    [provider]  budesonide-formoterol (SYMBICORT) 160-4.5 MCG/ACT inhaler Inhale 2 puffs into the lungs 2 (two) times daily. Patient taking differently: Inhale 2 puffs into the lungs 2 (two) times daily as needed (wheezing, sob). 05/18/16   Micheline Chapman, NP  celecoxib (CELEBREX) 200 MG capsule TAKE 1 CAPSULE (200 MG TOTAL) BY MOUTH 2 (TWO) TIMES DAILY. 03/11/21   Raulkar, Clide Deutscher, MD  cetirizine (ZYRTEC) 10 MG tablet Take 1 tablet (10 mg total) by mouth daily. 04/29/20   Raulkar, Clide Deutscher, MD  cyclobenzaprine (FLEXERIL) 10 MG tablet TAKE 1  TABLET (10 MG TOTAL) BY MOUTH 3 (THREE) TIMES DAILY AS NEEDED FOR MUSCLE SPASMS. 01/05/21   Raulkar, Clide Deutscher, MD  cyclobenzaprine (FLEXERIL) 5 MG tablet TAKE 1 TABLET (5 MG TOTAL) BY MOUTH 3 (THREE) TIMES DAILY AS NEEDED FOR MUSCLE SPASMS. 03/11/21   Raulkar, Clide Deutscher, MD  diclofenac Sodium (VOLTAREN) 1 % GEL APPLY 2 GRAMS TOPICALLY 4 (FOUR) TIMES DAILY. 03/11/21   Raulkar, Clide Deutscher, MD  DULoxetine (CYMBALTA) 20 MG capsule TAKE 1 CAPSULE (20 MG TOTAL) BY MOUTH DAILY. 03/11/21 03/11/22  Raulkar, Clide Deutscher, MD  DULoxetine (CYMBALTA) 30 MG capsule Take 1 capsule (30 mg total) by mouth daily. 03/05/21   Raulkar, Clide Deutscher, MD  ergocalciferol (VITAMIN D2) 1.25 MG (50000 UT) capsule Take 1 capsule (50,000 Units total) by mouth once a week. 03/19/20   Raulkar,  Clide Deutscher, MD  esomeprazole (NEXIUM) 40 MG capsule Take 1 capsule (40 mg total) by mouth 2 (two) times daily before a meal. 07/30/21 08/29/21  Mesner, Corene Cornea, MD  fluticasone (FLONASE) 50 MCG/ACT nasal spray PLACE 2 SPRAYS INTO BOTH NOSTRILS DAILY. 03/11/21   Raulkar, Clide Deutscher, MD  gabapentin (NEURONTIN) 400 MG capsule Take 1 capsule (400 mg total) by mouth 3 (three) times daily. 01/07/21   Raulkar, Clide Deutscher, MD  gabapentin (NEURONTIN) 800 MG tablet Take by mouth.    [provider]  hydrochlorothiazide (HYDRODIURIL) 25 MG tablet Take 1 tablet (25 mg total) by mouth daily. 05/18/16   Micheline Chapman, NP  hydroxychloroquine (PLAQUENIL) 200 MG tablet Take 1 tablet by mouth 2 (two) times daily.  03/21/17   [provider]  hydrOXYzine (ATARAX/VISTARIL) 25 MG tablet Take 1 tablet (25 mg total) by mouth every 6 (six) hours as needed for itching. 08/19/20   Sharion Balloon, NP  ipratropium-albuterol (DUONEB) 0.5-2.5 (3) MG/3ML SOLN Take 3 mLs by nebulization every 6 (six) hours as needed. Patient taking differently: Take 3 mLs by nebulization every 6 (six) hours as needed (for SOB). 05/18/16   Micheline Chapman, NP  leflunomide (ARAVA) 20 MG tablet Take by mouth. 11/11/19   [provider]  Misc. Devices (WALKER) MISC 1 Device by Does not apply route daily as needed. 07/03/21   Hazel Sams, PA-C  mupirocin cream (BACTROBAN) 2 % Apply 1 application topically 2 (two) times daily. To breast 04/13/17   Steve Rattler, DO  oxyCODONE (ROXICODONE) 15 MG immediate release tablet Take 1 tablet (15 mg total) by mouth 4 (four) times daily as needed for pain. 03/05/21   Raulkar, Clide Deutscher, MD  phenytoin (DILANTIN) 100 MG ER capsule Take 1 capsule (100 mg total) by mouth 3 (three) times daily. 05/18/16   Micheline Chapman, NP  pravastatin (PRAVACHOL) 20 MG tablet Take 1 tablet (20 mg total) by mouth daily. 05/18/16   Micheline Chapman, NP  PROAIR HFA 108 (90 Base) MCG/ACT inhaler INHALE 2  PUFFS BY MOUTH EVERY 6 HOURS AS NEEDED FOR SHORTNESS OF BREATH OR WHEEZING. 03/11/21   Raulkar, Clide Deutscher, MD  sucralfate (CARAFATE) 1 g tablet Take 1 tablet (1 g total) by mouth 4 (four) times daily -  with meals and at bedtime. 07/30/21   Mesner, Corene Cornea, MD  triamcinolone cream (KENALOG) 0.1 % Apply topically.    [provider]    Family History Family History  Problem Relation Age of Onset   Breast cancer Maternal Aunt        unsure how old of onset   Breast cancer Maternal  Aunt        unsure how old of onset   Colon cancer Neg Hx    Esophageal cancer Neg Hx    Rectal cancer Neg Hx    Stomach cancer Neg Hx     Social History Social History   Tobacco Use   Smoking status: Never   Smokeless tobacco: Never  Vaping Use   Vaping Use: Never used  Substance Use Topics   Alcohol use: No   Drug use: No    Types: "Crack" cocaine    Comment: last smoked 2011     Allergies   Hydrocodone-acetaminophen, Lortab [hydrocodone-acetaminophen], Niacin, Penicillin g, and Penicillins   Review of Systems Review of Systems  HENT: Negative.    Cardiovascular:  Positive for chest pain. Negative for palpitations.  Gastrointestinal:  Positive for nausea. Negative for abdominal pain, diarrhea and vomiting.  Musculoskeletal: Negative.   Neurological: Negative.     Physical Exam Triage Vital Signs ED Triage Vitals  Enc Vitals Group     BP 07/29/21 1613 (!) 146/72     Pulse Rate 07/29/21 1613 (!) 110     Resp 07/29/21 1613 18     Temp 07/29/21 1613 98.1 F (36.7 C)     Temp Source 07/29/21 1613 Oral     SpO2 07/29/21 1613 96 %     Weight --      Height --      Head Circumference --      Peak Flow --      Pain Score 07/29/21 1609 9     Pain Loc --      Pain Edu? --      Excl. in [place]? --    No data found.  Updated Vital Signs BP (!) 146/72 (BP Location: Left Arm) Comment (BP Location): large cuff   Pulse (!) 110    Temp 98.1 F (36.7 C) (Oral)    Resp 18    SpO2 96%    Visual Acuity Right Eye Distance:   Left Eye Distance:   Bilateral Distance:    Right Eye Near:   Left Eye Near:    Bilateral Near:     Physical Exam Vitals and nursing note reviewed.  Constitutional:      General: She is not in acute distress.    Appearance: She is not ill-appearing.  Cardiovascular:     Rate and Rhythm: Normal rate and regular rhythm.     Heart sounds: Normal heart sounds.  Pulmonary:     Effort: Pulmonary effort is normal.     Breath sounds: No decreased breath sounds, wheezing or rhonchi.  Abdominal:     General: Bowel sounds are normal.     Palpations: Abdomen is soft.     Tenderness: There is abdominal tenderness. There is no guarding.  Neurological:     Mental Status: She is alert.     UC Treatments / Results  Labs (all labs ordered are listed, but only abnormal results are displayed) Labs Reviewed - No data to display  EKG   Radiology DG Chest 2 View  Result Date: 07/29/2021 CLINICAL DATA:  Sharp intermittent chest pain x1 week EXAM: CHEST - 2 VIEW COMPARISON:  July 17, 2019 FINDINGS: The heart size and mediastinal contours are within normal limits. No focal consolidation. No pleural effusion. No pneumothorax. The visualized skeletal structures are unremarkable. IMPRESSION: No acute cardiopulmonary disease. Electronically Signed   By: Dahlia Bailiff M.D.   On: 07/29/2021 18:59  Procedures Procedures (including critical care time)  Medications Ordered in UC Medications - No data to display  Initial Impression / Assessment and Plan / UC Course  I have reviewed the triage vital signs and the nursing notes.  Pertinent labs & imaging results that were available during my care of the patient were reviewed by me and considered in my medical decision making (see chart for details).     1.  Chest pain: EKG shows normal sinus rhythm: Given the clinical presentation, family history and lack of improvement with trying Tums-patient is  advised to go to the emergency room for MI rule out and further evaluation. Final Clinical Impressions(s) / UC Diagnoses   Final diagnoses:  Chest pain, unspecified type     Discharge Instructions      Given your risk factors and clinical presentation please go to the emergency department for further evaluation.   ED Prescriptions   None    PDMP not reviewed this encounter.   Chase Picket, MD 07/30/21 1034

## 2021-07-31 ENCOUNTER — Telehealth: Payer: Self-pay

## 2021-07-31 NOTE — Telephone Encounter (Signed)
Patient calling in to obtain PCP. She was seen in the ED a few days ago. She is having trouble with her pain dcotor, Dr Zigmund Daniel. , states her chest pain is a little better with the medications prescribed. Referred to [facility] .

## 2021-08-05 ENCOUNTER — Other Ambulatory Visit: Payer: Self-pay | Admitting: Physical Medicine and Rehabilitation

## 2021-08-18 ENCOUNTER — Other Ambulatory Visit: Payer: Self-pay | Admitting: Physical Medicine and Rehabilitation

## 2021-09-08 ENCOUNTER — Other Ambulatory Visit: Payer: Self-pay | Admitting: Physical Medicine and Rehabilitation

## 2021-10-25 ENCOUNTER — Other Ambulatory Visit: Payer: Self-pay | Admitting: Physical Medicine and Rehabilitation

## 2021-10-28 ENCOUNTER — Encounter (INDEPENDENT_AMBULATORY_CARE_PROVIDER_SITE_OTHER): Payer: Self-pay | Admitting: Primary Care

## 2021-10-28 ENCOUNTER — Other Ambulatory Visit (HOSPITAL_COMMUNITY)
Admission: RE | Admit: 2021-10-28 | Discharge: 2021-10-28 | Disposition: A | Discharge status: Home / Self Care | Payer: Medicaid Other | Source: Ambulatory Visit | Attending: Primary Care | Admitting: Primary Care

## 2021-10-28 ENCOUNTER — Ambulatory Visit (INDEPENDENT_AMBULATORY_CARE_PROVIDER_SITE_OTHER): Payer: Medicaid Other | Admitting: Primary Care

## 2021-10-28 VITALS — BP 128/80 | HR 89 | Temp 97.9°F | Ht 62.0 in | Wt 218.6 lb

## 2021-10-28 DIAGNOSIS — E785 Hyperlipidemia, unspecified: Secondary | ICD-10-CM | POA: Diagnosis not present

## 2021-10-28 DIAGNOSIS — K2100 Gastro-esophageal reflux disease with esophagitis, without bleeding: Secondary | ICD-10-CM

## 2021-10-28 DIAGNOSIS — N898 Other specified noninflammatory disorders of vagina: Secondary | ICD-10-CM | POA: Diagnosis present

## 2021-10-28 DIAGNOSIS — Z7689 Persons encountering health services in other specified circumstances: Secondary | ICD-10-CM

## 2021-10-28 DIAGNOSIS — I10 Essential (primary) hypertension: Secondary | ICD-10-CM

## 2021-10-28 DIAGNOSIS — R21 Rash and other nonspecific skin eruption: Secondary | ICD-10-CM

## 2021-10-28 DIAGNOSIS — Z131 Encounter for screening for diabetes mellitus: Secondary | ICD-10-CM | POA: Diagnosis not present

## 2021-10-28 DIAGNOSIS — Z6839 Body mass index (BMI) 39.0-39.9, adult: Secondary | ICD-10-CM

## 2021-10-28 LAB — POCT GLYCOSYLATED HEMOGLOBIN (HGB A1C): Hemoglobin A1C: 5.1 % (ref 4.0–5.6)

## 2021-10-28 MED ORDER — AMLODIPINE BESYLATE 5 MG PO TABS: 5.0000 mg | ORAL_TABLET | Freq: Every day | ORAL | 1 refills | Status: DC

## 2021-10-28 MED ORDER — HYDROCHLOROTHIAZIDE 25 MG PO TABS: 25.0000 mg | ORAL_TABLET | Freq: Every day | ORAL | 3 refills | Status: DC

## 2021-10-28 MED ORDER — DOXYCYCLINE HYCLATE 100 MG PO TABS: 100.0000 mg | ORAL_TABLET | Freq: Two times a day (BID) | ORAL | 0 refills | Status: DC

## 2021-10-28 MED ORDER — ESOMEPRAZOLE MAGNESIUM 40 MG PO CPDR: DELAYED_RELEASE_CAPSULE | ORAL | 1 refills | Status: DC

## 2021-10-28 NOTE — Patient Instructions (Signed)
Rash, Adult A rash is a change in the color of your skin. A rash can also change the way your skin feels. There are many different conditions and factors that can cause a rash. Some rashes may disappear after a few days, but some may last for a few weeks. Common causes of rashes include: Viral infections, such as: Colds. Measles. Hand, foot, and mouth disease. Bacterial infections, such as: Scarlet fever. Impetigo. Fungal infections, such as Candida. Allergic reactions to food, medicines, or skin care products. Follow these instructions at home: The goal of treatment is to stop the itching and keep the rash from spreading. Pay attention to any changes in your symptoms. Follow these instructions to help with your condition: Medicine Take or apply over-the-counter and prescription medicines only as told by your health care provider. These may include: Corticosteroid creams to treat red or swollen skin. Anti-itch lotions. Oral allergy medicines (antihistamines). Oral corticosteroids for severe symptoms.  Skin care Apply cool compresses to the affected areas. Do not scratch or rub your skin. Avoid covering the rash. Make sure the rash is exposed to air as much as possible. Managing itching and discomfort Avoid hot showers or baths, which can make itching worse. A cold shower may help. Try taking a bath with: Epsom salts. Follow manufacturer instructions on the packaging. You can get these at your local pharmacy or grocery store. Baking soda. Pour a small amount into the bath as told by your health care provider. Colloidal oatmeal. Follow manufacturer instructions on the packaging. You can get this at your local pharmacy or grocery store. Try applying baking soda paste to your skin. Stir water into baking soda until it reaches a paste-like consistency. Try applying calamine lotion. This is an over-the-counter lotion that helps to relieve itchiness. Keep cool and out of the sun. Sweating  and being hot can make itching worse. General instructions  Rest as needed. Drink enough fluid to keep your urine pale yellow. Wear loose-fitting clothing. Avoid scented soaps, detergents, and perfumes. Use gentle soaps, detergents, perfumes, and other cosmetic products. Avoid any substance that causes your rash. Keep a journal to help track what causes your rash. Write down: What you eat. What cosmetic products you use. What you drink. What you wear. This includes jewelry. Keep all follow-up visits as told by your health care provider. This is important. Contact a health care provider if: You sweat at night. You lose weight. You urinate more than normal. You urinate less than normal, or you notice that your urine is a darker color than usual. You feel weak. You vomit. Your skin or the whites of your eyes look yellow (jaundice). Your skin: Tingles. Is numb. Your rash: Does not go away after several days. Gets worse. You are: Unusually thirsty. More tired than normal. You have: New symptoms. Pain in your abdomen. A fever. Diarrhea. Get help right away if you: Have a fever and your symptoms suddenly get worse. Develop confusion. Have a severe headache or a stiff neck. Have severe joint pains or stiffness. Have a seizure. Develop a rash that covers all or most of your body. The rash may or may not be painful. Develop blisters that: Are on top of the rash. Grow larger or grow together. Are painful. Are inside your nose or mouth. Develop a rash that: Looks like purple pinprick-sized spots all over your body. Has a "bull's eye" or looks like a target. Is not related to sun exposure, is red and painful, and causes   your skin to peel. Summary A rash is a change in the color of your skin. Some rashes disappear after a few days, but some may last for a few weeks. The goal of treatment is to stop the itching and keep the rash from spreading. Take or apply over-the-counter  and prescription medicines only as told by your health care provider. Contact a health care provider if you have new or worsening symptoms. Keep all follow-up visits as told by your health care provider. This is important. This information is not intended to replace advice given to you by your health care provider. Make sure you discuss any questions you have with your health care provider. Document Revised: 03/18/2021 Document Reviewed: 03/18/2021 Elsevier Patient Education  2023 [employer]  

## 2021-10-28 NOTE — Progress Notes (Signed)
New Patient Office Visit  Subjective    Patient ID: Heather Perry, female    DOB: 1965/08/05  Age: 56 y.o. MRN: [id number]  CC: vaginal itching   HPI Ms. Shannette Tabares presents to establish care.Concern with vaginal itching. Symptoms have been present for 10 days. Vaginal symptoms: local irritation and vulvar itching. Contraception: none. She denies blisters, bumps, lesions, and warts Sexually transmitted infection risk: possible STD exposure.  Patient has No headache, No chest pain, No abdominal pain - No Nausea, No new weakness tingling or numbness, No Cough - shortness of breath    Outpatient Encounter Medications as of 10/28/2021  Medication Sig   doxycycline (VIBRA-TABS) 100 MG tablet Take 1 tablet (100 mg total) by mouth 2 (two) times daily.   hydrochlorothiazide (HYDRODIURIL) 25 MG tablet Take 1 tablet (25 mg total) by mouth daily.   acyclovir ointment (ZOVIRAX) 5 % Apply 1 application topically every 3 (three) hours.   AMITIZA 24 MCG capsule TAKE 1 CAPSULE (24 MCG TOTAL) BY MOUTH 2 (TWO) TIMES DAILY WITH A MEAL.   amLODipine (NORVASC) 5 MG tablet Take 1 tablet (5 mg total) by mouth daily.   aspirin EC 81 MG tablet Take 81 mg by mouth daily.   budesonide-formoterol (SYMBICORT) 160-4.5 MCG/ACT inhaler Inhale 2 puffs into the lungs 2 (two) times daily. (Patient taking differently: Inhale 2 puffs into the lungs 2 (two) times daily as needed (wheezing, sob).)   celecoxib (CELEBREX) 200 MG capsule TAKE 1 CAPSULE (200 MG TOTAL) BY MOUTH 2 (TWO) TIMES DAILY.   cetirizine (ZYRTEC) 10 MG tablet Take 1 tablet (10 mg total) by mouth daily.   cyclobenzaprine (FLEXERIL) 10 MG tablet TAKE 1 TABLET (10 MG TOTAL) BY MOUTH 3 (THREE) TIMES DAILY AS NEEDED FOR MUSCLE SPASMS.   cyclobenzaprine (FLEXERIL) 5 MG tablet TAKE 1 TABLET (5 MG TOTAL) BY MOUTH 3 (THREE) TIMES DAILY AS NEEDED FOR MUSCLE SPASMS.   diclofenac Sodium (VOLTAREN) 1 % GEL APPLY 2 GRAMS TOPICALLY 4 (FOUR) TIMES DAILY.   DULoxetine  (CYMBALTA) 30 MG capsule Take 1 capsule (30 mg total) by mouth daily.   ergocalciferol (VITAMIN D2) 1.25 MG (50000 UT) capsule Take 1 capsule (50,000 Units total) by mouth once a week.   esomeprazole (NEXIUM) 40 MG capsule Take as needed once a day for indigestion   fluticasone (FLONASE) 50 MCG/ACT nasal spray PLACE 2 SPRAYS INTO BOTH NOSTRILS DAILY.   gabapentin (NEURONTIN) 400 MG capsule Take 1 capsule (400 mg total) by mouth 3 (three) times daily.   gabapentin (NEURONTIN) 800 MG tablet Take by mouth.   hydroxychloroquine (PLAQUENIL) 200 MG tablet Take 1 tablet by mouth 2 (two) times daily.    hydrOXYzine (ATARAX/VISTARIL) 25 MG tablet Take 1 tablet (25 mg total) by mouth every 6 (six) hours as needed for itching.   ipratropium-albuterol (DUONEB) 0.5-2.5 (3) MG/3ML SOLN Take 3 mLs by nebulization every 6 (six) hours as needed. (Patient taking differently: Take 3 mLs by nebulization every 6 (six) hours as needed (for SOB).)   leflunomide (ARAVA) 20 MG tablet Take by mouth.   Misc. Devices (WALKER) MISC 1 Device by Does not apply route daily as needed.   mupirocin cream (BACTROBAN) 2 % Apply 1 application topically 2 (two) times daily. To breast   phenytoin (DILANTIN) 100 MG ER capsule Take 1 capsule (100 mg total) by mouth 3 (three) times daily.   pravastatin (PRAVACHOL) 20 MG tablet Take 1 tablet (20 mg total) by mouth daily.   PROAIR HFA 108 (  90 Base) MCG/ACT inhaler INHALE 2 PUFFS BY MOUTH EVERY 6 HOURS AS NEEDED FOR SHORTNESS OF BREATH OR WHEEZING.   sucralfate (CARAFATE) 1 g tablet Take 1 tablet (1 g total) by mouth 4 (four) times daily -  with meals and at bedtime.   triamcinolone cream (KENALOG) 0.1 % Apply topically.   [DISCONTINUED] amLODipine (NORVASC) 5 MG tablet TAKE 1 TABLET (5 MG TOTAL) BY MOUTH DAILY.   [DISCONTINUED] DULoxetine (CYMBALTA) 20 MG capsule TAKE 1 CAPSULE (20 MG TOTAL) BY MOUTH DAILY.   [DISCONTINUED] esomeprazole (NEXIUM) 40 MG capsule Take 1 capsule (40 mg total) by  mouth 2 (two) times daily before a meal.   [DISCONTINUED] hydrochlorothiazide (HYDRODIURIL) 25 MG tablet Take 1 tablet (25 mg total) by mouth daily.   [DISCONTINUED] oxyCODONE (ROXICODONE) 15 MG immediate release tablet Take 1 tablet (15 mg total) by mouth 4 (four) times daily as needed for pain.   Facility-Administered Encounter Medications as of 10/28/2021  Medication   0.9 %  sodium chloride infusion    Past Medical History:  Diagnosis Date   Allergy    Anxiety    Arthritis    Asthma    Blood transfusion without reported diagnosis    Depression    GERD (gastroesophageal reflux disease)    High cholesterol    Hypertension    Obesity    Osteoporosis    Recurrent boils    Seizures ([place])    last seizure "3 years ago" per pt 06-16-16 KBW   Sleep apnea    no CPAP used   Uterine fibroid     Past Surgical History:  Procedure Laterality Date   ABDOMINAL HYSTERECTOMY     CESAREAN SECTION     KNEE SURGERY Right    x2   UPPER GASTROINTESTINAL ENDOSCOPY      Family History  Problem Relation Age of Onset   Breast cancer Maternal Aunt        unsure how old of onset   Breast cancer Maternal Aunt        unsure how old of onset   Colon cancer Neg Hx    Esophageal cancer Neg Hx    Rectal cancer Neg Hx    Stomach cancer Neg Hx     Social History   Socioeconomic History   Marital status: Single    Spouse name: Not on file   Number of children: Not on file   Years of education: Not on file   Highest education level: Not on file  Occupational History   Not on file  Tobacco Use   Smoking status: Never   Smokeless tobacco: Never  Vaping Use   Vaping Use: Never used  Substance and Sexual Activity   Alcohol use: No   Drug use: No    Types: "Crack" cocaine    Comment: last smoked 2011   Sexual activity: Not on file  Other Topics Concern   Not on file  Social History Narrative   Not on file   Social Determinants of Health   Financial Resource Strain: Not on file   Food Insecurity: Not on file  Transportation Needs: Not on file  Physical Activity: Not on file  Stress: Not on file  Social Connections: Not on file  Intimate Partner Violence: Not on file    ROS Comprehensive ROS Pertinent positive and negative noted in HPI       Objective   BP 128/80 (BP Location: Right Arm, Patient Position: Sitting, Cuff Size: Normal)  Pulse 89   Temp 97.9 F (36.6 C) (Oral)   Ht '5\' 2"'$  (1.575 m)   Wt 218 lb 9.6 oz (99.2 kg)   SpO2 96%   BMI 39.98 kg/m     Physical Exam Vitals reviewed.  Constitutional:      Appearance: She is obese.  HENT:     Head: Normocephalic.     Right Ear: Tympanic membrane normal.     Left Ear: Tympanic membrane normal.     Nose: Nose normal.  Eyes:     Extraocular Movements: Extraocular movements intact.     Pupils: Pupils are equal, round, and reactive to light.  Cardiovascular:     Rate and Rhythm: Normal rate and regular rhythm.  Pulmonary:     Effort: Pulmonary effort is normal.  Abdominal:     General: Bowel sounds are normal. There is distension.     Palpations: Abdomen is soft.  Musculoskeletal:     Cervical back: Normal range of motion and neck supple.  Skin:    General: Skin is warm.  Neurological:     Mental Status: She is alert and oriented to person, place, and time.  Psychiatric:        Mood and Affect: Mood normal.        Behavior: Behavior normal.        Thought Content: Thought content normal.        Judgment: Judgment normal.    Assessment & Plan:  Mirela was seen today for new patient (initial visit) and vaginal itching.  Diagnoses and all orders for this visit:  Vaginal itching -     Cervicovaginal ancillary only  Encounter to establish care Establish care   Essential hypertension BP goal - < 130/80 Explained that having normal blood pressure is the goal and medications are helping to get to goal and maintain normal blood pressure. DIET: Limit salt intake, read nutrition labels  to check salt content, limit fried and high fatty foods  Avoid using multisymptom OTC cold preparations that generally contain sudafed which can rise BP. Consult with pharmacist on best cold relief products to use for persons with HTN EXERCISE Discussed incorporating exercise such as walking - 30 minutes most days of the week and can do in 10 minute intervals    -     amLODipine (NORVASC) 5 MG tablet; Take 1 tablet (5 mg total) by mouth daily.  Hyperlipidemia with target LDL less than 100  Healthy lifestyle diet of fruits vegetables fish nuts whole grains and low saturated fat . Foods high in cholesterol or liver, fatty meats,cheese, butter avocados, nuts and seeds, chocolate and fried foods. -     Lipid Panel  Morbid obesity ([place]) Class 3 obesity due to excess  calories without serious comorbidity with body mass index (BMI) of 40.0 to 44.9 in adult [facility]) - program of caloric reduction, exercise and behavioral modification recommended    Screening for diabetes mellitus -     HgB A1c 5.4  Gastroesophageal reflux disease with esophagitis without hemorrhage No red flags present. Diet discussed. Avoid fried, spicy, fatty, greasy, and acidic foods. Avoid caffeine, nicotine, and alcohol. Do not eat 2-3 hours before bedtime and stay upright for at least 1-2 hours after eating. Eat small frequent meals. Avoid NSAID's like motrin and aleve. Medications as prescribed. Report any new or worsening symptoms. Follow up as discussed or sooner if needed.    -     esomeprazole (NEXIUM) 40 MG capsule; Take as needed  once a day for indigestion  Rash and nonspecific skin eruption   -     doxycycline (VIBRA-TABS) 100 MG tablet; Take 1 tablet (100 mg total) by mouth 2 (two) times daily.  Other orders -     hydrochlorothiazide (HYDRODIURIL) 25 MG tablet; Take 1 tablet (25 mg total) by mouth daily.     Return in about 3 months (around 01/28/2022) for medical conditions.   Kerin Perna, NP

## 2021-10-29 LAB — CERVICOVAGINAL ANCILLARY ONLY
Bacterial Vaginitis (gardnerella): NEGATIVE
Candida Glabrata: NEGATIVE
Candida Vaginitis: NEGATIVE
Chlamydia: NEGATIVE
Comment: NEGATIVE
Comment: NEGATIVE
Comment: NEGATIVE
Comment: NEGATIVE
Comment: NEGATIVE
Comment: NORMAL
Neisseria Gonorrhea: NEGATIVE
Trichomonas: POSITIVE — AB

## 2021-10-30 LAB — LIPID PANEL
Chol/HDL Ratio: 2.1 ratio (ref 0.0–4.4)
Cholesterol, Total: 250 mg/dL — ABNORMAL HIGH (ref 100–199)
HDL: 119 mg/dL (ref >39)
LDL Chol Calc (NIH): 108 mg/dL — ABNORMAL HIGH (ref 0–99)
Triglycerides: 138 mg/dL (ref 0–149)
VLDL Cholesterol Cal: 23 mg/dL (ref 5–40)

## 2021-11-04 ENCOUNTER — Telehealth: Payer: Self-pay

## 2021-11-04 ENCOUNTER — Other Ambulatory Visit (INDEPENDENT_AMBULATORY_CARE_PROVIDER_SITE_OTHER): Payer: Self-pay | Admitting: Primary Care

## 2021-11-04 ENCOUNTER — Telehealth (INDEPENDENT_AMBULATORY_CARE_PROVIDER_SITE_OTHER): Payer: Self-pay | Admitting: Primary Care

## 2021-11-04 DIAGNOSIS — A599 Trichomoniasis, unspecified: Secondary | ICD-10-CM

## 2021-11-04 MED ORDER — METRONIDAZOLE 500 MG PO TABS: 500.0000 mg | ORAL_TABLET | Freq: Two times a day (BID) | ORAL | 0 refills | Status: DC

## 2021-11-04 NOTE — Telephone Encounter (Signed)
Patient aware of name/ spelling of STD.

## 2021-11-04 NOTE — Telephone Encounter (Signed)
Pt calling back and stated that she would like some more info regarding STD. She states that she would like to know the name of it. Please advise.

## 2021-11-04 NOTE — Telephone Encounter (Signed)
-----   Message from Kerin Perna, NP sent at 11/04/2021  2:39 PM EDT ----- You have trichomonas . A prescription for metronidazole. You take this medication twice a day for 7 days. Be sure that you do not drink alcohol when you take this medication because the combination can give you severe nausea and vomiting.  It can sometimes give people a metallic taste in their mouth while they are taking it. When you take antibiotics, it can wipe out your gut flora.  This can cause problems like diarrhea.   Your cholesterol is high, Increase risk of heart attack and/or stroke.  To reduce your Cholesterol , Remember - more fruits and vegetables, more fish, and limit red meat and dairy products. More soy, nuts, beans, barley, lentils, oats and plant sterol ester enriched margarine instead of butter. I also encourage eliminating sugar and processed food. New script sent for atorvastatin '20mg'$

## 2021-11-04 NOTE — Telephone Encounter (Signed)
Copied from [place] [id number]. Topic: General - Other >> Nov 04, 2021 11:40 AM Bayard Beaver wrote: Reason for CRM: Pt called in several times bout her lab results, and sill she hasn't received them. Please call back

## 2021-11-04 NOTE — Telephone Encounter (Signed)
Patient is aware of all blood results as well STD results. Advised of medications sent for cholesterol and medications to treat STD. She verbalized understanding of all results. Nat Christen, CMA

## 2021-11-05 ENCOUNTER — Telehealth (INDEPENDENT_AMBULATORY_CARE_PROVIDER_SITE_OTHER): Payer: Self-pay

## 2021-11-05 ENCOUNTER — Other Ambulatory Visit: Payer: Self-pay

## 2021-11-05 MED ORDER — ATORVASTATIN CALCIUM 20 MG PO TABS: 20.0000 mg | ORAL_TABLET | Freq: Every day | ORAL | 1 refills | Status: DC

## 2021-11-05 NOTE — Telephone Encounter (Signed)
Pt calling in to check in on status of atorvastatin since it wasn't sent to pharmacy. Pt also had questions about having trichomonas and how she got it whether from toilet seat or sharing a towel. Pt said she did have unprotected sex but with the same person so unsure how she got the infection. I advised her on the sexual contact transmission on the infection and also explained I would send a message to [place], NP for the medication to be ordered. Pt verbalized understanding.

## 2021-11-05 NOTE — Telephone Encounter (Signed)
Medication sent.

## 2021-12-01 ENCOUNTER — Ambulatory Visit (INDEPENDENT_AMBULATORY_CARE_PROVIDER_SITE_OTHER): Payer: Medicaid Other | Admitting: Primary Care

## 2021-12-01 ENCOUNTER — Encounter (INDEPENDENT_AMBULATORY_CARE_PROVIDER_SITE_OTHER): Payer: Self-pay | Admitting: Primary Care

## 2021-12-01 ENCOUNTER — Other Ambulatory Visit (HOSPITAL_COMMUNITY)
Admission: RE | Admit: 2021-12-01 | Discharge: 2021-12-01 | Disposition: A | Discharge status: Home / Self Care | Payer: Medicaid Other | Source: Ambulatory Visit | Attending: Primary Care | Admitting: Primary Care

## 2021-12-01 VITALS — BP 131/85 | HR 86 | Temp 98.2°F | Ht 62.0 in | Wt 215.2 lb

## 2021-12-01 DIAGNOSIS — Z760 Encounter for issue of repeat prescription: Secondary | ICD-10-CM | POA: Diagnosis not present

## 2021-12-01 DIAGNOSIS — Z202 Contact with and (suspected) exposure to infections with a predominantly sexual mode of transmission: Secondary | ICD-10-CM | POA: Diagnosis present

## 2021-12-01 DIAGNOSIS — F5101 Primary insomnia: Secondary | ICD-10-CM | POA: Diagnosis not present

## 2021-12-01 MED ORDER — CYCLOBENZAPRINE HCL 10 MG PO TABS: 10.0000 mg | ORAL_TABLET | Freq: Three times a day (TID) | ORAL | 1 refills | Status: DC | PRN

## 2021-12-01 MED ORDER — TRAZODONE HCL 50 MG PO TABS: 25.0000 mg | ORAL_TABLET | Freq: Every evening | ORAL | 3 refills | Status: DC | PRN

## 2021-12-01 NOTE — Patient Instructions (Signed)
Insomnia Insomnia is a sleep disorder that makes it difficult to fall asleep or stay asleep. Insomnia can cause fatigue, low energy, difficulty concentrating, mood swings, and poor performance at work or school. There are three different ways to classify insomnia: Difficulty falling asleep. Difficulty staying asleep. Waking up too early in the morning. Any type of insomnia can be long-term (chronic) or short-term (acute). Both are common. Short-term insomnia usually lasts for 3 months or less. Chronic insomnia occurs at least three times a week for longer than 3 months. What are the causes? Insomnia may be caused by another condition, situation, or substance, such as: Having certain mental health conditions, such as anxiety and depression. Using caffeine, alcohol, tobacco, or drugs. Having gastrointestinal conditions, such as gastroesophageal reflux disease (GERD). Having certain medical conditions. These include: Asthma. Alzheimer's disease. Stroke. Chronic pain. An overactive thyroid gland (hyperthyroidism). Other sleep disorders, such as restless legs syndrome and sleep apnea. Menopause. Sometimes, the cause of insomnia may not be known. What increases the risk? Risk factors for insomnia include: Gender. Females are affected more often than males. Age. Insomnia is more common as people get older. Stress and certain medical and mental health conditions. Lack of exercise. Having an irregular work schedule. This may include working night shifts and traveling between different time zones. What are the signs or symptoms? If you have insomnia, the main symptom is having trouble falling asleep or having trouble staying asleep. This may lead to other symptoms, such as: Feeling tired or having low energy. Feeling nervous about going to sleep. Not feeling rested in the morning. Having trouble concentrating. Feeling irritable, anxious, or depressed. How is this diagnosed? This condition  may be diagnosed based on: Your symptoms and medical history. Your health care provider may ask about: Your sleep habits. Any medical conditions you have. Your mental health. A physical exam. How is this treated? Treatment for insomnia depends on the cause. Treatment may focus on treating an underlying condition that is causing the insomnia. Treatment may also include: Medicines to help you sleep. Counseling or therapy. Lifestyle adjustments to help you sleep better. Follow these instructions at home: Eating and drinking  Limit or avoid alcohol, caffeinated beverages, and products that contain nicotine and tobacco, especially close to bedtime. These can disrupt your sleep. Do not eat a large meal or eat spicy foods right before bedtime. This can lead to digestive discomfort that can make it hard for you to sleep. Sleep habits  Keep a sleep diary to help you and your health care provider figure out what could be causing your insomnia. Write down: When you sleep. When you wake up during the night. How well you sleep and how rested you feel the next day. Any side effects of medicines you are taking. What you eat and drink. Make your bedroom a dark, comfortable place where it is easy to fall asleep. Put up shades or blackout curtains to block light from outside. Use a white noise machine to block noise. Keep the temperature cool. Limit screen use before bedtime. This includes: Not watching TV. Not using your smartphone, tablet, or computer. Stick to a routine that includes going to bed and waking up at the same times every day and night. This can help you fall asleep faster. Consider making a quiet activity, such as reading, part of your nighttime routine. Try to avoid taking naps during the day so that you sleep better at night. Get out of bed if you are still awake after   15 minutes of trying to sleep. Keep the lights down, but try reading or doing a quiet activity. When you feel  sleepy, go back to bed. General instructions Take over-the-counter and prescription medicines only as told by your health care provider. Exercise regularly as told by your health care provider. However, avoid exercising in the hours right before bedtime. Use relaxation techniques to manage stress. Ask your health care provider to suggest some techniques that may work well for you. These may include: Breathing exercises. Routines to release muscle tension. Visualizing peaceful scenes. Make sure that you drive carefully. Do not drive if you feel very sleepy. Keep all follow-up visits. This is important. Contact a health care provider if: You are tired throughout the day. You have trouble in your daily routine due to sleepiness. You continue to have sleep problems, or your sleep problems get worse. Get help right away if: You have thoughts about hurting yourself or someone else. Get help right away if you feel like you may hurt yourself or others, or have thoughts about taking your own life. Go to your nearest emergency room or: Call 911. Call the National Suicide Prevention Lifeline at 1-800-273-8255 or 988. This is open 24 hours a day. Text the Crisis Text Line at 741741. Summary Insomnia is a sleep disorder that makes it difficult to fall asleep or stay asleep. Insomnia can be long-term (chronic) or short-term (acute). Treatment for insomnia depends on the cause. Treatment may focus on treating an underlying condition that is causing the insomnia. Keep a sleep diary to help you and your health care provider figure out what could be causing your insomnia. This information is not intended to replace advice given to you by your health care provider. Make sure you discuss any questions you have with your health care provider. Document Revised: 05/17/2021 Document Reviewed: 05/17/2021 [employer] Patient Education  2023 [employer]  

## 2021-12-01 NOTE — Progress Notes (Signed)
[facility]   CC: STI Exposure  SUBJECTIVE:  Heather Perry is a 56 y.o. female who presents with complaints of no vaginal discharge but when she had Trichomonas she only had itching.  Patient denies a precipitating event, recent sexual encounter or recent antibiotic use.  Patient is sexually active with 1 of female.   Patient denies fever, chills, nausea, vomiting, abdominal or pelvic pain, urinary symptoms, genital itching, odor and/or bleeding, dyspareunia rashes or lesions. Problems sleeping - stays up all night watching TV.  No LMP recorded. Patient has had a hysterectomy. Current birth control method: Compliant with BC:  ROS: As per HPI.  All other pertinent ROS negative.     Past Medical History:  Diagnosis Date   Allergy    Anxiety    Arthritis    Asthma    Blood transfusion without reported diagnosis    Depression    GERD (gastroesophageal reflux disease)    High cholesterol    Hypertension    Obesity    Osteoporosis    Recurrent boils    Seizures ([place])    last seizure "3 years ago" per pt 06-16-16 KBW   Sleep apnea    no CPAP used   Uterine fibroid    Past Surgical History:  Procedure Laterality Date   ABDOMINAL HYSTERECTOMY     CESAREAN SECTION     KNEE SURGERY Right    x2   UPPER GASTROINTESTINAL ENDOSCOPY     Allergies  Allergen Reactions   Hydrocodone-Acetaminophen Itching    Started itching.  Pt states that she can take this medication Other reaction(s): Other (See Comments) Pruritus   Lortab [Hydrocodone-Acetaminophen]     Started itching   Niacin Hives   Penicillin G Hives   Penicillins Itching   Current Outpatient Medications on File Prior to Visit  Medication Sig Dispense Refill   acyclovir ointment (ZOVIRAX) 5 % Apply 1 application topically every 3 (three) hours. 15 g 1   AMITIZA 24 MCG capsule TAKE 1 CAPSULE (24 MCG TOTAL) BY MOUTH 2 (TWO) TIMES DAILY WITH A MEAL. 60 capsule 1   amLODipine (NORVASC) 5 MG tablet Take 1  tablet (5 mg total) by mouth daily. 90 tablet 1   aspirin EC 81 MG tablet Take 81 mg by mouth daily.     atorvastatin (LIPITOR) 20 MG tablet Take 1 tablet (20 mg total) by mouth daily. 90 tablet 1   budesonide-formoterol (SYMBICORT) 160-4.5 MCG/ACT inhaler Inhale 2 puffs into the lungs 2 (two) times daily. (Patient taking differently: Inhale 2 puffs into the lungs 2 (two) times daily as needed (wheezing, sob).) 1 Inhaler 1   cetirizine (ZYRTEC) 10 MG tablet Take 1 tablet (10 mg total) by mouth daily. 30 tablet 1   diclofenac Sodium (VOLTAREN) 1 % GEL APPLY 2 GRAMS TOPICALLY 4 (FOUR) TIMES DAILY. 500 g 0   DULoxetine (CYMBALTA) 30 MG capsule Take 1 capsule (30 mg total) by mouth daily. 30 capsule 3   esomeprazole (NEXIUM) 40 MG capsule Take as needed once a day for indigestion 30 capsule 1   hydrochlorothiazide (HYDRODIURIL) 25 MG tablet Take 1 tablet (25 mg total) by mouth daily. 90 tablet 3   hydroxychloroquine (PLAQUENIL) 200 MG tablet Take 1 tablet by mouth 2 (two) times daily.      ipratropium-albuterol (DUONEB) 0.5-2.5 (3) MG/3ML SOLN Take 3 mLs by nebulization every 6 (six) hours as needed. (Patient taking differently: Take 3 mLs by nebulization every 6 (six) hours as needed (for SOB).)  360 mL 1   leflunomide (ARAVA) 20 MG tablet Take by mouth.     Misc. Devices (WALKER) MISC 1 Device by Does not apply route daily as needed. 1 each 0   PROAIR HFA 108 (90 Base) MCG/ACT inhaler INHALE 2 PUFFS BY MOUTH EVERY 6 HOURS AS NEEDED FOR SHORTNESS OF BREATH OR WHEEZING. 8.5 g 1   triamcinolone cream (KENALOG) 0.1 % Apply topically.     Current Facility-Administered Medications on File Prior to Visit  Medication Dose Route Frequency Provider Last Rate Last Admin   0.9 %  sodium chloride infusion  500 mL Intravenous Continuous Nandigam, Venia Minks, MD        Social History   Socioeconomic History   Marital status: Single    Spouse name: Not on file   Number of children: Not on file   Years of  education: Not on file   Highest education level: Not on file  Occupational History   Not on file  Tobacco Use   Smoking status: Never   Smokeless tobacco: Never  Vaping Use   Vaping Use: Never used  Substance and Sexual Activity   Alcohol use: No   Drug use: No    Types: "Crack" cocaine    Comment: last smoked 2011   Sexual activity: Not on file  Other Topics Concern   Not on file  Social History Narrative   Not on file   Social Determinants of Health   Financial Resource Strain: Not on file  Food Insecurity: Not on file  Transportation Needs: Not on file  Physical Activity: Not on file  Stress: Not on file  Social Connections: Not on file  Intimate Partner Violence: Not on file   Family History  Problem Relation Age of Onset   Breast cancer Maternal Aunt        unsure how old of onset   Breast cancer Maternal Aunt        unsure how old of onset   Colon cancer Neg Hx    Esophageal cancer Neg Hx    Rectal cancer Neg Hx    Stomach cancer Neg Hx     OBJECTIVE:  Vitals:   12/01/21 1536  BP: 131/85  Pulse: 86  Temp: 98.2 F (36.8 C)  TempSrc: Oral  SpO2: 96%  Weight: 215 lb 3.2 oz (97.6 kg)  Height: '5\' 2"'$  (1.575 m)     General appearance: Alert, NAD, obese female appears stated age Head: NCAT Throat: lips, mucosa, and tongue normal; teeth and gums normal Lungs: CTA bilaterally without adventitious breath sounds Heart: regular rate and rhythm.  Radial pulses 2+ symmetrical bilaterally Back: no CVA tenderness Abdomen: soft, non-tender; bowel sounds normal; no masses or organomegaly; no guarding or rebound tenderness Vaginal self-swab obtained. We will follow up with you regarding abnormal results Skin: warm and dry Psychological:  Alert and cooperative. Normal mood and affect.  LABS:  Results for orders placed or performed in visit on 10/28/21  Lipid Panel  Result Value Ref Range   Cholesterol, Total 250 (H) 100 - 199 mg/dL   Triglycerides 138 0 - 149  mg/dL   HDL 119 >39 mg/dL   VLDL Cholesterol Cal 23 5 - 40 mg/dL   LDL Chol Calc (NIH) 108 (H) 0 - 99 mg/dL   Chol/HDL Ratio 2.1 0.0 - 4.4 ratio  HgB A1c  Result Value Ref Range   Hemoglobin A1C 5.1 4.0 - 5.6 %   HbA1c POC (<> result, manual entry)  HbA1c, POC (prediabetic range)     HbA1c, POC (controlled diabetic range)    Cervicovaginal ancillary only  Result Value Ref Range   Candida Vaginitis Negative    Candida Glabrata Negative    Trichomonas Positive (A)    Bacterial Vaginitis (gardnerella) Negative    Chlamydia Negative    Neisseria Gonorrhea Negative    Comment Normal Reference Range Candida Species - Negative    Comment Normal Reference Range Candida Galbrata - Negative    Comment Normal Reference Range Trichomonas - Negative    Comment      Normal Reference Range Bacterial Vaginosis - Negative   Comment Normal Reference [place] Chlamydia - Negative    Comment      Normal Reference Range Neisseria Gonorrhea - Negative     ASSESSMENT & PLAN: Sahory was seen today for follow-up and medication refill.  Diagnoses and all orders for this visit:  Exposure to sexually transmitted disease (STD) -     Cervicovaginal ancillary only  Primary insomnia Information for insomnia on AVS -     traZODone (DESYREL) 50 MG tablet; Take 0.5-1 tablets (25-50 mg total) by mouth at bedtime as needed for sleep.  Medication refill -     cyclobenzaprine (FLEXERIL) 10 MG tablet; Take 1 tablet (10 mg total) by mouth 3 (three) times daily as needed for muscle spasms.  If tests results are positive, please abstain from sexual activity until you and your partner(s) have been treated Follow up with PCP or [facility] if symptoms persists   Juluis Mire, NP 12/01/2021 3:46 PM

## 2021-12-03 LAB — CERVICOVAGINAL ANCILLARY ONLY
Bacterial Vaginitis (gardnerella): NEGATIVE
Candida Glabrata: NEGATIVE
Candida Vaginitis: NEGATIVE
Chlamydia: NEGATIVE
Comment: NEGATIVE
Comment: NEGATIVE
Comment: NEGATIVE
Comment: NEGATIVE
Comment: NEGATIVE
Comment: NORMAL
Neisseria Gonorrhea: NEGATIVE
Trichomonas: NEGATIVE

## 2021-12-07 ENCOUNTER — Telehealth (INDEPENDENT_AMBULATORY_CARE_PROVIDER_SITE_OTHER): Payer: Self-pay

## 2021-12-07 NOTE — Telephone Encounter (Signed)
-----   Message from Kerin Perna, NP sent at 12/06/2021 10:52 AM EDT ----- Pap is normal  recommendations screening of cervical cancer every 3 years with cervical cytology.

## 2021-12-07 NOTE — Telephone Encounter (Signed)
Patient aware of negative results. Nat Christen, CMA

## 2021-12-08 ENCOUNTER — Ambulatory Visit (INDEPENDENT_AMBULATORY_CARE_PROVIDER_SITE_OTHER): Payer: Self-pay | Admitting: *Deleted

## 2021-12-08 NOTE — Telephone Encounter (Signed)
Patient aware that appt is needed to discuss fall with PCP as it was not discussed at last ov. Scheduled for 12/29/21 and placed patient on waitlist.

## 2021-12-08 NOTE — Telephone Encounter (Signed)
  Chief Complaint: Fall, neck pain Symptoms: Pt fell 1 month ago, tripped after knee "Gave out" Reports neck pain, right side of head "Tender." Reports tingling arms "That's not new." Frequency: 1 month ago Pertinent Negatives: Patient denies numbness,visual changes,dizziness Disposition: '[]'$ ED /'[]'$ Urgent Care (no appt availability in office) / '[]'$ Appointment(In office/virtual)/ '[]'$  [facility]/ '[]'$ Home Care/ '[]'$ Refused Recommended Disposition /'[]'$ [facility]/ '[x]'$  Follow-up with PCP Additional Notes: Pt was not evaluated when fall occurred. Pt states had OV (12/01/21) and forgot to mention this. She is requesting "Scan to see why this still hurts." Advised may need appt first, states "I was just there!" Please advise. Reason for Disposition  [1] Fall AND [2] patient seems very anxious and fearful of falling again  Answer Assessment - Initial Assessment Questions 1. MECHANISM: "How did the fall happen?"     Tripped and fell 2. DOMESTIC VIOLENCE AND ELDER ABUSE SCREENING: "Did you fall because someone pushed you or tried to hurt you?" If Yes, ask: "Are you safe now?"     no 3. ONSET: "When did the fall happen?" (e.g., minutes, hours, or days ago)     1 month ago 4. LOCATION: "What part of the body hit the ground?" (e.g., back, buttocks, head, hips, knees, hands, head, stomach)     Whole body and back of head 5. INJURY: "Did you hurt (injure) yourself when you fell?" If Yes, ask: "What did you injure? Tell me more about this?" (e.g., body area; type of injury; pain severity)"     Yes hit head 6. PAIN: "Is there any pain?" If Yes, ask: "How bad is the pain?" (e.g., Scale 1-10; or mild,  moderate, severe)   - NONE (0): No pain   - MILD (1-3): Doesn't interfere with normal activities    - MODERATE (4-7): Interferes with normal activities or awakens from sleep    - SEVERE (8-10): Excruciating pain, unable to do any normal activities      8/10   Pain for 2 weeks, pain pills help,  right size of head 7. SIZE: For cuts, bruises, or swelling, ask: "How large is it?" (e.g., inches or centimeters)      No 9. OTHER SYMPTOMS: "Do you have any other symptoms?" (e.g., dizziness, fever, weakness; new onset or worsening).      Tingling in neck and right arm, "Not new" 10. CAUSE: "What do you think caused the fall (or falling)?" (e.g., tripped, dizzy spell)      Knee gave out and tripped  Protocols used: Falls and [facility]

## 2021-12-09 ENCOUNTER — Telehealth (INDEPENDENT_AMBULATORY_CARE_PROVIDER_SITE_OTHER): Payer: Self-pay | Admitting: Primary Care

## 2021-12-09 NOTE — Telephone Encounter (Signed)
Copied from [place] [id number]. Topic: General - Other >> Dec 09, 2021 10:50 AM Everette C wrote: Reason for CRM: Katharine Look with [employer] has called for an update on a personal care service form faxed to the practice originally on 12/01/21  Katharine Look is calling to check on the status of the paperwork and it's completion  Please contact Katharine Look with update when possible

## 2021-12-09 NOTE — Telephone Encounter (Signed)
Left message for Heather Perry. Unfortunately the office fax machine has been down and office is not receiving faxes. Feel free to return call to [phone] to discuss other options to receive form for personal care services.

## 2021-12-15 ENCOUNTER — Other Ambulatory Visit: Payer: Self-pay | Admitting: Physical Medicine and Rehabilitation

## 2021-12-22 NOTE — Telephone Encounter (Signed)
Katharine Look from [facility] requesting to speak with [place]. Advised caller CMA is out of the office today and will leave a message for CMA to return call. Caller checking on the status of forms that were emailed. Unable to complete the call due to bad connection.

## 2021-12-23 NOTE — Telephone Encounter (Signed)
Katharine Look w/ [facility] requesting a return call  Please reference note below and fu w/ caller  Phone [phone]

## 2021-12-23 NOTE — Telephone Encounter (Signed)
Forms have been faxed back.  

## 2021-12-24 ENCOUNTER — Other Ambulatory Visit (INDEPENDENT_AMBULATORY_CARE_PROVIDER_SITE_OTHER): Payer: Self-pay | Admitting: Primary Care

## 2021-12-24 DIAGNOSIS — K2100 Gastro-esophageal reflux disease with esophagitis, without bleeding: Secondary | ICD-10-CM

## 2021-12-24 NOTE — Telephone Encounter (Signed)
Requested medications are due for refill today.  unsure  Requested medications are on the active medications list.  yes  Last refill. Flonase 03/11/2021, Nexium 10/28/2021 #30 0 refills  Future visit scheduled.   yes  Notes to clinic.  Flonase was signed by Leeroy Cha. No PCP listed. Labs are expired for Nexium.    Requested Prescriptions  Pending Prescriptions Disp Refills   fluticasone (FLONASE) 50 MCG/ACT nasal spray [Pharmacy Med Name: FLUTICASONE PROPIONATE 50 MCG/ACT NASAL SUSPENSION] 16 g 1    Sig: PLACE 2 SPRAYS INTO BOTH NOSTRILS DAILY.     Ear, Nose, and Throat: Nasal Preparations - Corticosteroids Passed - 12/24/2021  1:20 PM      Passed - Valid encounter within last 12 months    Recent Outpatient Visits           3 weeks ago Exposure to sexually transmitted disease (STD)   [place], Michelle P, NP   1 month ago Vaginal itching   [place] Kerin Perna, NP   4 years ago Lumbar degenerative disc disease   [place], Roger David, PA-C   4 years ago Annual physical exam   [place] Clent Demark, PA-C   5 years ago Furuncle of breast   [place], Roger David, PA-C       Future Appointments             In 5 days Kerin Perna, NP [place]   In 1 month [place], Milford Cage, NP [place]   In 2 months Edwards, Milford Cage, NP [facility]             esomeprazole ([place]) 40 MG capsule [Pharmacy Med Name: ESOMEPRAZOLE MAGNESIUM 40 MG ORAL CAPSULE DELAYED RELEASE] 30 capsule 1    Sig: TAKE ONE CAPSULE BY MOUTH ONCE DAILY AS NEEDED FOR INDIGESTION     Gastroenterology: Proton Pump Inhibitors 2 Failed - 12/24/2021  1:20 PM      Failed - ALT in normal range and within 360 days    ALT  Date Value Ref Range Status  08/19/2017 35 14  - 54 U/L Final         Failed - AST in normal range and within 360 days    AST  Date Value Ref Range Status  08/19/2017 36 15 - 41 U/L Final         Passed - Valid encounter within last 12 months    Recent Outpatient Visits           3 weeks ago Exposure to sexually transmitted disease (STD)   [place], Shedd, NP   1 month ago Vaginal itching   [place] Kerin Perna, NP   4 years ago Lumbar degenerative disc disease   [place], Roger David, PA-C   4 years ago Annual physical exam   [place], Roger David, PA-C   5 years ago Furuncle of breast   [place], Roger David, PA-C       Future Appointments             In 5 days Kerin Perna, NP [place]   In 1 month Kerin Perna, NP  [place] [facility]   In 2 months Oletta Lamas, Milford Cage, NP [place]

## 2021-12-27 NOTE — Telephone Encounter (Signed)
Routed to PCP 

## 2021-12-29 ENCOUNTER — Encounter (INDEPENDENT_AMBULATORY_CARE_PROVIDER_SITE_OTHER): Payer: Medicaid Other | Admitting: Primary Care

## 2021-12-29 ENCOUNTER — Encounter (INDEPENDENT_AMBULATORY_CARE_PROVIDER_SITE_OTHER): Payer: Self-pay

## 2021-12-29 ENCOUNTER — Encounter (INDEPENDENT_AMBULATORY_CARE_PROVIDER_SITE_OTHER): Payer: Self-pay | Admitting: Primary Care

## 2021-12-29 VITALS — Ht 62.0 in

## 2022-01-02 NOTE — Progress Notes (Signed)
Not seen

## 2022-01-16 ENCOUNTER — Encounter (HOSPITAL_COMMUNITY): Payer: Self-pay

## 2022-01-16 ENCOUNTER — Ambulatory Visit (HOSPITAL_COMMUNITY)
Admission: EM | Admit: 2022-01-16 | Discharge: 2022-01-16 | Disposition: A | Discharge status: Home / Self Care | Payer: Medicaid Other | Attending: Family Medicine | Admitting: Family Medicine

## 2022-01-16 DIAGNOSIS — K529 Noninfective gastroenteritis and colitis, unspecified: Secondary | ICD-10-CM | POA: Insufficient documentation

## 2022-01-16 LAB — POCT URINALYSIS DIPSTICK, ED / UC
Glucose, UA: NEGATIVE mg/dL
Hgb urine dipstick: NEGATIVE
Ketones, ur: NEGATIVE mg/dL
Leukocytes,Ua: NEGATIVE
Nitrite: NEGATIVE
Protein, ur: 100 mg/dL — AB
Specific Gravity, Urine: >=1.03 (ref 1.005–1.030)
Urobilinogen, UA: 0.2 mg/dL (ref 0.0–1.0)
pH: 6 (ref 5.0–8.0)

## 2022-01-16 MED ORDER — KETOROLAC TROMETHAMINE 30 MG/ML IJ SOLN
INTRAMUSCULAR | Status: AC
  Filled 2022-01-16: qty 1

## 2022-01-16 MED ORDER — CIPROFLOXACIN HCL 500 MG PO TABS: 500.0000 mg | ORAL_TABLET | Freq: Two times a day (BID) | ORAL | 0 refills | Status: DC

## 2022-01-16 MED ORDER — ONDANSETRON 4 MG PO TBDP: 4.0000 mg | ORAL_TABLET | Freq: Three times a day (TID) | ORAL | 0 refills | Status: DC | PRN

## 2022-01-16 MED ORDER — KETOROLAC TROMETHAMINE 30 MG/ML IJ SOLN
30.0000 mg | Freq: Once | INTRAMUSCULAR | Status: AC
  Administered 2022-01-16: 30 mg via INTRAMUSCULAR

## 2022-01-16 MED ORDER — CIPROFLOXACIN HCL 500 MG PO TABS: 500.0000 mg | ORAL_TABLET | Freq: Two times a day (BID) | ORAL | 0 refills | Status: AC

## 2022-01-16 NOTE — Discharge Instructions (Addendum)
You have been given a shot of Toradol 30 mg today.  Ondansetron dissolved in the mouth every 8 hours as needed for nausea or vomiting. Clear liquids and bland things to eat.  Take Cipro 500--comes 1 tablet 2 times daily for 7 days.  This is an antibiotic.  I am prescribing it in case this problem was due to a bacteria in your intestines.  You worsen in any way--more intense pain, vomiting everything or feel weak.,  Present to the emergency room

## 2022-01-16 NOTE — ED Provider Notes (Signed)
[facility]    CSN: [id number] Arrival date & time: 01/16/22  1021      History   Chief Complaint Chief Complaint  Patient presents with   Abdominal Cramping   Diarrhea   Exposure to STD    HPI Taylee Gunnells is a 56 y.o. female.    Abdominal Cramping  Diarrhea Exposure to STD   Here for abdominal cramping, vomiting, and diarrhea.  Symptoms began 3 days ago.  She threw up once or twice but has not thrown up in about 2 days.  She has been having several episodes of loose stools daily.  Last one was this morning.  She is having pain in her suprapubic area and bilateral lower quadrants.  No fever or chills  No blood in her urine.  The pain is worse at night    Past Medical History:  Diagnosis Date   Allergy    Anxiety    Arthritis    Asthma    Blood transfusion without reported diagnosis    Depression    GERD (gastroesophageal reflux disease)    High cholesterol    Hypertension    Obesity    Osteoporosis    Recurrent boils    Seizures ([place])    last seizure "3 years ago" per pt 06-16-16 KBW   Sleep apnea    no CPAP used   Uterine fibroid     Patient Active Problem List   Diagnosis Date Noted   Lumbar degenerative disc disease 07/28/2020   Anxiety disorder 06/28/2017   MDD (major depressive disorder), recurrent episode, mild ([place]) 06/28/2017   Essential hypertension 06/26/2017   Hyperlipidemia with target LDL less than 100 06/26/2017   Chest pain with low risk for cardiac etiology 06/26/2017   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      Home Medications    Prior to Admission medications   Medication Sig Start Date End Date  Taking? Authorizing Provider  acyclovir ointment (ZOVIRAX) 5 % Apply 1 application topically every 3 (three) hours. 05/18/16   Micheline Chapman, NP  AMITIZA 24 MCG capsule TAKE 1 CAPSULE (24 MCG TOTAL) BY MOUTH 2 (TWO) TIMES DAILY WITH A MEAL. 03/11/21   Raulkar, Clide Deutscher, MD  amLODipine (NORVASC) 5 MG tablet Take 1 tablet (5 mg total) by mouth daily. 10/28/21   Kerin Perna, NP  aspirin EC 81 MG tablet Take 81 mg by mouth daily.    [provider]  atorvastatin (LIPITOR) 20 MG tablet Take 1 tablet (20 mg total) by mouth daily. 11/05/21   Kerin Perna, NP  budesonide-formoterol (SYMBICORT) 160-4.5 MCG/ACT inhaler Inhale 2 puffs into the lungs 2 (two) times daily. Patient taking differently: Inhale 2 puffs into the lungs 2 (two) times daily as needed (wheezing, sob). 05/18/16   Micheline Chapman, NP  cetirizine (ZYRTEC) 10 MG tablet Take 1 tablet (10 mg total) by mouth daily. 04/29/20   Raulkar, Clide Deutscher, MD  cyclobenzaprine (FLEXERIL) 10 MG tablet Take 1 tablet (10 mg total) by mouth 3 (three) times daily as needed for muscle spasms. 12/01/21   Kerin Perna, NP  diclofenac Sodium (VOLTAREN) 1 % GEL APPLY 2 GRAMS TOPICALLY 4 (FOUR) TIMES DAILY. 03/11/21   Raulkar, Clide Deutscher, MD  esomeprazole (NEXIUM) 40 MG capsule TAKE ONE CAPSULE BY MOUTH ONCE DAILY AS NEEDED FOR INDIGESTION 12/28/21   Kerin Perna, NP  fluticasone (FLONASE) 50 MCG/ACT nasal spray PLACE 2 SPRAYS INTO BOTH NOSTRILS DAILY. 12/28/21   Kerin Perna, NP  hydrochlorothiazide (HYDRODIURIL) 25 MG tablet Take 1 tablet (25 mg total) by mouth daily. 10/28/21   Kerin Perna, NP  hydroxychloroquine (PLAQUENIL) 200 MG tablet Take 1 tablet by mouth 2 (two) times daily.  03/21/17   [provider]  ipratropium-albuterol (DUONEB) 0.5-2.5 (3) MG/3ML SOLN Take 3 mLs by nebulization every 6 (six) hours as needed. Patient taking differently: Take 3 mLs by nebulization every 6 (six) hours as needed  (for SOB). 05/18/16   Micheline Chapman, NP  leflunomide (ARAVA) 20 MG tablet Take by mouth. 11/11/19   [provider]  Misc. Devices (WALKER) MISC 1 Device by Does not apply route daily as needed. 07/03/21   Hazel Sams, PA-C  PROAIR HFA 108 (90 Base) MCG/ACT inhaler INHALE 2 PUFFS BY MOUTH EVERY 6 HOURS AS NEEDED FOR SHORTNESS OF BREATH OR WHEEZING. 03/11/21   Raulkar, Clide Deutscher, MD  traZODone (DESYREL) 50 MG tablet Take 0.5-1 tablets (25-50 mg total) by mouth at bedtime as needed for sleep. 12/01/21   Kerin Perna, NP  triamcinolone cream (KENALOG) 0.1 % Apply topically.    [provider]    Family History Family History  Problem Relation Age of Onset   Breast cancer Maternal Aunt        unsure how old of onset   Breast cancer Maternal Aunt        unsure how old of onset   Colon cancer Neg Hx    Esophageal cancer Neg Hx    Rectal cancer Neg Hx    Stomach cancer Neg Hx     Social History Social History   Tobacco Use   Smoking status: Never   Smokeless tobacco: Never  Vaping Use   Vaping Use: Never used  Substance Use Topics   Alcohol use: No   Drug use: No    Types: "Crack" cocaine    Comment: last smoked 2011     Allergies   Hydrocodone-acetaminophen, Lortab [hydrocodone-acetaminophen], Niacin, Penicillin g, and Penicillins   Review of Systems Review of Systems  Gastrointestinal:  Positive for diarrhea.     Physical Exam Triage Vital Signs ED Triage Vitals [01/16/22 1137]  Enc Vitals Group     BP (!) 141/74     Pulse Rate (!) 105     Resp 16     Temp 98.7 F (37.1 C)     Temp Source Oral     SpO2 98 %     Weight      Height      Head Circumference      Peak Flow      Pain Score      Pain Loc      Pain Edu?      Excl. in [place]?    No data found.  Updated Vital Signs BP (!) 141/74 (BP Location: Left Arm)   Pulse (!) 105   Temp 98.7 F (37.1 C) (Oral)   Resp 16   SpO2 98%   Visual Acuity Right Eye Distance:   Left  Eye Distance:   Bilateral Distance:    Right Eye  Near:   Left Eye Near:    Bilateral Near:     Physical Exam Vitals and nursing note reviewed.  Constitutional:      Appearance: She is not ill-appearing, toxic-appearing or diaphoretic.     Comments: I enter the room, the patient is rocking back and forth in pain.  HENT:     Mouth/Throat:     Mouth: Mucous membranes are moist.     Pharynx: No oropharyngeal exudate or posterior oropharyngeal erythema.  Eyes:     Extraocular Movements: Extraocular movements intact.     Conjunctiva/sclera: Conjunctivae normal.     Pupils: Pupils are equal, round, and reactive to light.  Cardiovascular:     Rate and Rhythm: Normal rate and regular rhythm.     Heart sounds: No murmur heard. Pulmonary:     Effort: Pulmonary effort is normal.     Breath sounds: Normal breath sounds. No stridor. No wheezing, rhonchi or rales.  Abdominal:     General: Bowel sounds are normal. There is no distension.     Palpations: Abdomen is soft. There is no mass.     Tenderness: There is abdominal tenderness (bilateral lower quadrants and suprapubic). There is no guarding.  Musculoskeletal:     Cervical back: Neck supple.  Lymphadenopathy:     Cervical: No cervical adenopathy.  Skin:    Coloration: Skin is not jaundiced or pale.  Neurological:     General: No focal deficit present.     Mental Status: She is oriented to person, place, and time.      UC Treatments / Results  Labs (all labs ordered are listed, but only abnormal results are displayed) Labs Reviewed  URINE CULTURE  POCT URINALYSIS DIPSTICK, ED / UC  CERVICOVAGINAL ANCILLARY ONLY    EKG   Radiology No results found.  Procedures Procedures (including critical care time)  Medications Ordered in UC Medications  ketorolac (TORADOL) 30 MG/ML injection 30 mg (has no administration in time range)    Initial Impression / Assessment and Plan / UC Course  I have reviewed the triage vital  signs and the nursing notes.  Pertinent labs & imaging results that were available during my care of the patient were reviewed by me and considered in my medical decision making (see chart for details).     I discussed with the patient her probable need for evaluation in the emergency room.  She is adamant that she does not want to go over there right now.  We reached a compromise for I will send in Cipro for possible colitis, we will provide a Toradol shot here, and Zofran for the nausea.  If she has any worsening or is not improving in the next 48 hours she is to present to the emergency room.  She already takes oxycodone 15 mg 4 times daily, and clonazepam 30 mg once daily.  I am not going to provide any extra narcotic pain relief at this time.  She requested STD testing Final Clinical Impressions(s) / UC Diagnoses   Final diagnoses:  Colitis     Discharge Instructions      You have been given a shot of Toradol 30 mg today.  Ondansetron dissolved in the mouth every 8 hours as needed for nausea or vomiting. Clear liquids and bland things to eat.  Take Cipro 500--comes 1 tablet 2 times daily for 7 days.  This is an antibiotic.  I am prescribing it in case this problem was due to a bacteria  in your intestines.  You worsen in any way--more intense pain, vomiting everything or feel weak.,  Present to the emergency room       ED Prescriptions   None    I have reviewed the PDMP during this encounter.   Barrett Henle, MD 01/16/22 1213

## 2022-01-16 NOTE — ED Triage Notes (Signed)
Onset 3-4 days of diarrhea abdominal pain. Pt would like STI testing.

## 2022-01-17 LAB — CERVICOVAGINAL ANCILLARY ONLY
Bacterial Vaginitis (gardnerella): NEGATIVE
Candida Glabrata: NEGATIVE
Candida Vaginitis: NEGATIVE
Chlamydia: NEGATIVE
Comment: NEGATIVE
Comment: NEGATIVE
Comment: NEGATIVE
Comment: NEGATIVE
Comment: NEGATIVE
Comment: NORMAL
Neisseria Gonorrhea: NEGATIVE
Trichomonas: NEGATIVE

## 2022-01-17 LAB — URINE CULTURE: Culture: NO GROWTH

## 2022-01-18 ENCOUNTER — Ambulatory Visit (INDEPENDENT_AMBULATORY_CARE_PROVIDER_SITE_OTHER): Payer: Self-pay

## 2022-01-18 ENCOUNTER — Telehealth (HOSPITAL_COMMUNITY): Payer: Self-pay | Admitting: Emergency Medicine

## 2022-01-18 NOTE — Telephone Encounter (Addendum)
    Chief Complaint: Lower abdominal pain. Seen in [facility] 01/16/22. On antibiotic. Symptoms: Pain, diarrhea Frequency: 4 days ago Pertinent Negatives: Patient denies fever Disposition: '[]'$ ED /'[]'$ Urgent Care (no appt availability in office) / '[]'$ Appointment(In office/virtual)/ '[]'$  [place] [facility]/ '[]'$ Home Care/ '[]'$ Refused Recommended Disposition /'[]'$ [place] [facility]/ '[x]'$  Follow-up with PCP Additional Notes: Pt. Asking to be seen sooner than 01/28/22. Please advise. Answer Assessment - Initial Assessment Questions 1. LOCATION: "Where does it hurt?"      Lower 2. RADIATION: "Does the pain shoot anywhere else?" (e.g., chest, back)     No 3. ONSET: "When did the pain begin?" (e.g., minutes, hours or days ago)      Sunday 4. SUDDEN: "Gradual or sudden onset?"     Sudden 5. PATTERN "Does the pain come and go, or is it constant?"    - If it comes and goes: "How long does it last?" "Do you have pain now?"     (Note: Comes and goes means the pain is intermittent. It goes away completely between bouts.)    - If constant: "Is it getting better, staying the same, or getting worse?"      (Note: Constant means the pain never goes away completely; most serious pain is constant and gets worse.)      Comes and goes 6. SEVERITY: "How bad is the pain?"  (e.g., Scale 1-10; mild, moderate, or severe)    - MILD (1-3): Doesn't interfere with normal activities, abdomen soft and not tender to touch.     - MODERATE (4-7): Interferes with normal activities or awakens from sleep, abdomen tender to touch.     - SEVERE (8-10): Excruciating pain, doubled over, unable to do any normal activities.       No pain now 7. RECURRENT SYMPTOM: "Have you ever had this type of stomach pain before?" If Yes, ask: "When was the last time?" and "What happened that time?"      Yes 8. CAUSE: "What do you think is causing the stomach pain?"     Unsure 9. RELIEVING/AGGRAVATING FACTORS: "What makes it better or worse?" (e.g.,  antacids, bending or twisting motion, bowel movement)     No 10. OTHER SYMPTOMS: "Do you have any other symptoms?" (e.g., back pain, diarrhea, fever, urination pain, vomiting)       Diarrhea -  11. PREGNANCY: "Is there any chance you are pregnant?" "When was your last menstrual period?"       No  Protocols used: Abdominal Pain - [facility]

## 2022-01-18 NOTE — Telephone Encounter (Signed)
Spoke with patient. She was seen in ED on 7/30 and given medication. She states she is feeling better. Will wait to PCP on 8/11.

## 2022-01-18 NOTE — Telephone Encounter (Signed)
Patient left voicemail looking for results from recent visit. Attempted to return call, unable to LVM

## 2022-01-19 ENCOUNTER — Other Ambulatory Visit: Payer: Self-pay

## 2022-01-19 ENCOUNTER — Encounter (HOSPITAL_COMMUNITY): Payer: Self-pay

## 2022-01-19 ENCOUNTER — Emergency Department (HOSPITAL_COMMUNITY): Payer: Medicaid Other

## 2022-01-19 ENCOUNTER — Emergency Department (HOSPITAL_COMMUNITY)
Admission: EM | Admit: 2022-01-19 | Discharge: 2022-01-19 | Disposition: A | Discharge status: Home / Self Care | Payer: Medicaid Other | Attending: Emergency Medicine | Admitting: Emergency Medicine

## 2022-01-19 DIAGNOSIS — R112 Nausea with vomiting, unspecified: Secondary | ICD-10-CM | POA: Insufficient documentation

## 2022-01-19 DIAGNOSIS — R7401 Elevation of levels of liver transaminase levels: Secondary | ICD-10-CM | POA: Diagnosis not present

## 2022-01-19 DIAGNOSIS — R748 Abnormal levels of other serum enzymes: Secondary | ICD-10-CM | POA: Diagnosis not present

## 2022-01-19 DIAGNOSIS — R1032 Left lower quadrant pain: Secondary | ICD-10-CM | POA: Diagnosis present

## 2022-01-19 DIAGNOSIS — R197 Diarrhea, unspecified: Secondary | ICD-10-CM | POA: Diagnosis not present

## 2022-01-19 DIAGNOSIS — I10 Essential (primary) hypertension: Secondary | ICD-10-CM | POA: Diagnosis not present

## 2022-01-19 DIAGNOSIS — Z7982 Long term (current) use of aspirin: Secondary | ICD-10-CM | POA: Diagnosis not present

## 2022-01-19 DIAGNOSIS — Z79899 Other long term (current) drug therapy: Secondary | ICD-10-CM | POA: Diagnosis not present

## 2022-01-19 LAB — URINALYSIS, ROUTINE W REFLEX MICROSCOPIC
Bilirubin Urine: NEGATIVE
Glucose, UA: NEGATIVE mg/dL
Hgb urine dipstick: NEGATIVE
Ketones, ur: NEGATIVE mg/dL
Leukocytes,Ua: NEGATIVE
Nitrite: NEGATIVE
Protein, ur: NEGATIVE mg/dL
Specific Gravity, Urine: 1.02 (ref 1.005–1.030)
pH: 5 (ref 5.0–8.0)

## 2022-01-19 LAB — LIPASE, BLOOD: Lipase: 50 U/L (ref 11–51)

## 2022-01-19 LAB — COMPREHENSIVE METABOLIC PANEL
ALT: 82 U/L — ABNORMAL HIGH (ref 0–44)
AST: 63 U/L — ABNORMAL HIGH (ref 15–41)
Albumin: 3.3 g/dL — ABNORMAL LOW (ref 3.5–5.0)
Alkaline Phosphatase: 147 U/L — ABNORMAL HIGH (ref 38–126)
Anion gap: 7 (ref 5–15)
BUN: 8 mg/dL (ref 6–20)
CO2: 28 mmol/L (ref 22–32)
Calcium: 9.4 mg/dL (ref 8.9–10.3)
Chloride: 104 mmol/L (ref 98–111)
Creatinine, Ser: 0.71 mg/dL (ref 0.44–1.00)
GFR, Estimated: >60 mL/min (ref >60)
Glucose, Bld: 96 mg/dL (ref 70–99)
Potassium: 3.5 mmol/L (ref 3.5–5.1)
Sodium: 139 mmol/L (ref 135–145)
Total Bilirubin: 0.5 mg/dL (ref 0.3–1.2)
Total Protein: 6.9 g/dL (ref 6.5–8.1)

## 2022-01-19 LAB — CBC WITH DIFFERENTIAL/PLATELET
Abs Immature Granulocytes: 0.23 10*3/uL — ABNORMAL HIGH (ref 0.00–0.07)
Basophils Absolute: 0 10*3/uL (ref 0.0–0.1)
Basophils Relative: 1 %
Eosinophils Absolute: 0.1 10*3/uL (ref 0.0–0.5)
Eosinophils Relative: 3 %
HCT: 37.8 % (ref 36.0–46.0)
Hemoglobin: 12.4 g/dL (ref 12.0–15.0)
Immature Granulocytes: 5 %
Lymphocytes Relative: 27 %
Lymphs Abs: 1.2 10*3/uL (ref 0.7–4.0)
MCH: 30.3 pg (ref 26.0–34.0)
MCHC: 32.8 g/dL (ref 30.0–36.0)
MCV: 92.4 fL (ref 80.0–100.0)
Monocytes Absolute: 0.5 10*3/uL (ref 0.1–1.0)
Monocytes Relative: 11 %
Neutro Abs: 2.3 10*3/uL (ref 1.7–7.7)
Neutrophils Relative %: 53 %
Platelets: 191 10*3/uL (ref 150–400)
RBC: 4.09 MIL/uL (ref 3.87–5.11)
RDW: 13.2 % (ref 11.5–15.5)
WBC: 4.4 10*3/uL (ref 4.0–10.5)
nRBC: 0 % (ref 0.0–0.2)

## 2022-01-19 MED ORDER — MORPHINE SULFATE (PF) 4 MG/ML IV SOLN
4.0000 mg | Freq: Once | INTRAVENOUS | Status: AC
  Administered 2022-01-19: 4 mg via INTRAVENOUS
  Filled 2022-01-19: qty 1

## 2022-01-19 MED ORDER — ONDANSETRON HCL 4 MG/2ML IJ SOLN
4.0000 mg | Freq: Once | INTRAMUSCULAR | Status: DC
  Filled 2022-01-19: qty 2

## 2022-01-19 MED ORDER — IOHEXOL 300 MG/ML  SOLN
100.0000 mL | Freq: Once | INTRAMUSCULAR | Status: AC | PRN
  Administered 2022-01-19: 100 mL via INTRAVENOUS

## 2022-01-19 MED ORDER — SODIUM CHLORIDE 0.9 % IV BOLUS
1000.0000 mL | Freq: Once | INTRAVENOUS | Status: AC
  Administered 2022-01-19: 1000 mL via INTRAVENOUS

## 2022-01-19 NOTE — ED Provider Notes (Signed)
[facility] EMERGENCY DEPARTMENT Provider Note   CSN: [id number] Arrival date & time: 01/19/22  1120     History  Chief Complaint  Patient presents with   Abdominal Pain    Heather Perry is a 56 y.o. female.  HPI  56 year old female presents emergency department with complaints of left lower quadrant abdominal pain.  Patient states her symptoms began approximate 1 week ago this past Wednesday.  She had left lower quadrant abdominal pain with associated vomiting and diarrhea.  Her symptoms have been consistent in nature and persisted for prompting urgent care visit on Saturday.  Her urine at that point was unremarkable and she was placed on ciprofloxacin for diagnosis of colitis.  She is taking antibiotic as prescribed and states that her symptoms have significantly improved.  She is no longer vomiting nor having diarrhea and states that her abdominal pain has also significantly improved to the point of near resolution.  She presents to the emergency department requesting imaging.  She describes the abdominal pain as "sharp cramps" which are similar to prior menstrual cycles and do not radiate anywhere.  She denies urinary/vaginal symptoms.  She states medicines have helped relieve her symptoms and notes the only exacerbating symptoms when she is lying down flat at night.  She denies any association with food or physical activity.  She also describes some urinary urgency with minimal voiding for the past 2 days. She has a prior surgical history significant for total hysterectomy including oophorectomy bilaterally performed abdominally and cesarean section.  Denies fever, chills, night sweats, chest pain, shortness of breath, hematic emesis, hematochezia, melena.   Past medical history significant for obesity, knee seizures, major depressive disorder, anxiety disorder, hyperlipidemia, hypertension, osteoporosis  Home Medications Prior to Admission medications   Medication Sig  Start Date End Date Taking? Authorizing Provider  amLODipine (NORVASC) 5 MG tablet Take 1 tablet (5 mg total) by mouth daily. 10/28/21  Yes Kerin Perna, NP  atorvastatin (LIPITOR) 20 MG tablet Take 1 tablet (20 mg total) by mouth daily. 11/05/21  Yes Kerin Perna, NP  budesonide-formoterol (SYMBICORT) 160-4.5 MCG/ACT inhaler Inhale 2 puffs into the lungs 2 (two) times daily. Patient taking differently: Inhale 2 puffs into the lungs 2 (two) times daily as needed (wheezing, sob). 05/18/16  Yes Micheline Chapman, NP  celecoxib (CELEBREX) 100 MG capsule Take 100 mg by mouth 2 (two) times daily. 12/24/21  Yes [provider]  ciprofloxacin (CIPRO) 500 MG tablet Take 1 tablet (500 mg total) by mouth 2 (two) times daily for 7 days. 01/16/22 01/23/22 Yes Banister, Gwenlyn Perking, MD  clonazePAM (KLONOPIN) 1 MG tablet Take 0.5 mg by mouth 2 (two) times daily as needed for anxiety. 12/15/21  Yes [provider]  diclofenac Sodium (VOLTAREN) 1 % GEL APPLY 2 GRAMS TOPICALLY 4 (FOUR) TIMES DAILY. 03/11/21  Yes Raulkar, Clide Deutscher, MD  DULoxetine (CYMBALTA) 60 MG capsule Take 60 mg by mouth daily. 12/15/21  Yes [provider]  esomeprazole (NEXIUM) 40 MG capsule TAKE ONE CAPSULE BY MOUTH ONCE DAILY AS NEEDED FOR INDIGESTION 12/28/21  Yes Edwards, Michelle P, NP  fluticasone (FLONASE) 50 MCG/ACT nasal spray PLACE 2 SPRAYS INTO BOTH NOSTRILS DAILY. 12/28/21  Yes Kerin Perna, NP  gabapentin (NEURONTIN) 300 MG capsule Take 300 mg by mouth 3 (three) times daily. 12/24/21  Yes [provider]  acyclovir ointment (ZOVIRAX) 5 % Apply 1 application topically every 3 (three) hours. 05/18/16   Micheline Chapman, NP  AMITIZA 24 MCG capsule TAKE 1 CAPSULE (24 MCG TOTAL) BY MOUTH 2 (TWO) TIMES DAILY WITH A MEAL. 03/11/21   Raulkar, Clide Deutscher, MD  aspirin EC 81 MG tablet Take 81 mg by mouth daily.    [provider]  cetirizine (ZYRTEC) 10 MG tablet Take 1 tablet (10 mg total) by  mouth daily. 04/29/20   Raulkar, Clide Deutscher, MD  cyclobenzaprine (FLEXERIL) 10 MG tablet Take 1 tablet (10 mg total) by mouth 3 (three) times daily as needed for muscle spasms. 12/01/21   Kerin Perna, NP  hydrochlorothiazide (HYDRODIURIL) 25 MG tablet Take 1 tablet (25 mg total) by mouth daily. 10/28/21   Kerin Perna, NP  hydroxychloroquine (PLAQUENIL) 200 MG tablet Take 1 tablet by mouth 2 (two) times daily.  03/21/17   [provider]  ipratropium-albuterol (DUONEB) 0.5-2.5 (3) MG/3ML SOLN Take 3 mLs by nebulization every 6 (six) hours as needed. Patient taking differently: Take 3 mLs by nebulization every 6 (six) hours as needed (for SOB). 05/18/16   Micheline Chapman, NP  leflunomide (ARAVA) 20 MG tablet Take by mouth. 11/11/19   [provider]  methocarbamol (ROBAXIN) 500 MG tablet Take 500 mg by mouth 2 (two) times daily as needed. 10/25/21   [provider]  Misc. Devices (WALKER) MISC 1 Device by Does not apply route daily as needed. 07/03/21   Hazel Sams, PA-C  ondansetron (ZOFRAN-ODT) 4 MG disintegrating tablet Take 1 tablet (4 mg total) by mouth every 8 (eight) hours as needed for nausea or vomiting. 01/16/22   Barrett Henle, MD  oxyCODONE (ROXICODONE) 15 MG immediate release tablet Take 15 mg by mouth 4 (four) times daily as needed. 12/29/21   [provider]  pilocarpine (SALAGEN) 5 MG tablet Take 5 mg by mouth 3 (three) times daily. 11/22/21   [provider]  PREMARIN vaginal cream Place vaginally. 11/23/21   [provider]  PROAIR HFA 108 (90 Base) MCG/ACT inhaler INHALE 2 PUFFS BY MOUTH EVERY 6 HOURS AS NEEDED FOR SHORTNESS OF BREATH OR WHEEZING. 03/11/21   Raulkar, Clide Deutscher, MD  RESTASIS MULTIDOSE 0.05 % ophthalmic emulsion 1 drop 2 (two) times daily. 11/22/21   [provider]  traZODone (DESYREL) 50 MG tablet Take 0.5-1 tablets (25-50 mg total) by mouth at bedtime as needed for sleep. 12/01/21   Kerin Perna, NP  triamcinolone cream (KENALOG) 0.1 % Apply topically.    [provider]      Allergies    Hydrocodone-acetaminophen, Lortab [hydrocodone-acetaminophen], Niacin, Penicillin g, and Penicillins    Review of Systems   Review of Systems  All other systems reviewed and are negative.   Physical Exam Updated Vital Signs BP 133/82 (BP Location: Right Wrist)   Pulse 83   Temp 97.6 F (36.4 C) (Oral)   Resp 15   Ht 5' 2"  (1.575 m)   Wt 96.2 kg   SpO2 96%   BMI 38.78 kg/m  Physical Exam Vitals and nursing note reviewed.  Constitutional:      General: She is not in acute distress.    Appearance: She is well-developed. She is obese. She is not ill-appearing, toxic-appearing or diaphoretic.  HENT:     Head: Normocephalic and atraumatic.     Mouth/Throat:     Mouth: Mucous membranes are moist.     Pharynx: Oropharynx is clear.  Eyes:     Conjunctiva/sclera: Conjunctivae normal.  Cardiovascular:     Rate and Rhythm: Normal  rate and regular rhythm.     Heart sounds: No murmur heard. Pulmonary:     Effort: Pulmonary effort is normal. No respiratory distress.     Breath sounds: Normal breath sounds.  Abdominal:     Palpations: Abdomen is soft.     Tenderness: There is no abdominal tenderness. There is no right CVA tenderness, left CVA tenderness, guarding or rebound.     Comments: No focality of tenderness.  Patient states that she is not actively in pain. Previous scar from abdominal hysterectomy/cesarean section noted on patient's abdomen with no signs of erythema, fluctuance, induration.  Musculoskeletal:        General: No swelling.     Cervical back: Neck supple.     Right lower leg: No edema.     Left lower leg: No edema.  Skin:    General: Skin is warm and dry.     Capillary Refill: Capillary refill takes less than 2 seconds.  Neurological:     Mental Status: She is alert.  Psychiatric:        Mood and Affect: Mood normal.     ED Results /  Procedures / Treatments   Labs (all labs ordered are listed, but only abnormal results are displayed) Labs Reviewed  COMPREHENSIVE METABOLIC PANEL - Abnormal; Notable for the following components:      Result Value   Albumin 3.3 (*)    AST 63 (*)    ALT 82 (*)    Alkaline Phosphatase 147 (*)    All other components within normal limits  CBC WITH DIFFERENTIAL/PLATELET - Abnormal; Notable for the following components:   Abs Immature Granulocytes 0.23 (*)    All other components within normal limits  URINALYSIS, ROUTINE W REFLEX MICROSCOPIC - Abnormal; Notable for the following components:   APPearance HAZY (*)    All other components within normal limits  LIPASE, BLOOD    EKG None  Radiology CT Abdomen Pelvis W Contrast  Result Date: 01/19/2022 CLINICAL DATA:  Left lower quadrant abdominal pain with nausea for the last week. EXAM: CT ABDOMEN AND PELVIS WITH CONTRAST TECHNIQUE: Multidetector CT imaging of the abdomen and pelvis was performed using the standard protocol following bolus administration of intravenous contrast. RADIATION DOSE REDUCTION: This exam was performed according to the departmental dose-optimization program which includes automated exposure control, adjustment of the mA and/or kV according to patient size and/or use of iterative reconstruction technique. CONTRAST:  161m OMNIPAQUE IOHEXOL 300 MG/ML  SOLN COMPARISON:  No comparison CT. Limited correlation made with chest radiographs 07/29/2021 and lumbar spine radiographs 04/30/2020. Lumbar MRI 02/28/2017. FINDINGS: Lower chest: Clear lung bases. No significant pleural or pericardial effusion. Possible mild distal esophageal wall thickening. Hepatobiliary: The liver is normal in density without suspicious focal abnormality. No evidence of gallstones, gallbladder wall thickening or biliary dilatation. Pancreas: Unremarkable. No pancreatic ductal dilatation or surrounding inflammatory changes. Spleen: Normal in size without  focal abnormality. Adrenals/Urinary Tract: Both adrenal glands appear normal. There are subcentimeter low-density renal lesions bilaterally which are too small to characterize, but likely represent cysts. No follow-up imaging recommended. No evidence of urinary tract calculus or hydronephrosis. The bladder appears unremarkable for its degree of distention. Stomach/Bowel: No enteric contrast administered. There is a gastric fundal diverticulum. The stomach otherwise appears unremarkable for its degree of distention. No evidence of bowel wall thickening, distention or surrounding inflammation. The appendix appears normal. Mild distal colonic diverticulosis without evidence of acute inflammation. Vascular/Lymphatic: There are no enlarged abdominal or pelvic  lymph nodes. No significant vascular findings. Reproductive: Hysterectomy.  No adnexal mass. Other: Postsurgical changes in the anterior abdominal wall with a small periumbilical hernia containing only fat. No ascites or free air. Musculoskeletal: There is severe endplate sclerosis and disc space narrowing at T10-11, without definite surrounding inflammation, not previously imaged. This may contribute to spinal stenosis. In addition, there is lower lumbar spondylosis with disc bulging and facet hypertrophy at L4-5 and L5-S1 with resulting biforaminal narrowing which appears progressive from previous MRI. IMPRESSION: 1. No acute findings or explanation for left lower quadrant abdominal pain identified. No evidence of diverticulitis or ureteral calculus. 2. Possible mild distal esophageal wall thickening and a small gastric fundal diverticulum are noted incidentally. 3. Prominent disc space narrowing and endplate sclerosis at X77-41, not previously imaged. This could relate to chronic degenerative disc disease or remote discitis. Progressive biforaminal narrowing at L4-5 and L5-S1 due to spondylosis. No definite acute osseous findings. Electronically Signed   By:  Richardean Sale M.D.   On: 01/19/2022 15:25    Procedures Procedures    Medications Ordered in ED Medications  ondansetron (ZOFRAN) injection 4 mg (has no administration in time range)  sodium chloride 0.9 % bolus 1,000 mL (1,000 mLs Intravenous New Bag/Given 01/19/22 1245)  morphine (PF) 4 MG/ML injection 4 mg (4 mg Intravenous Given 01/19/22 1241)  iohexol (OMNIPAQUE) 300 MG/ML solution 100 mL (100 mLs Intravenous Contrast Given 01/19/22 1511)    ED Course/ Medical Decision Making/ A&P                           Medical Decision Making Amount and/or Complexity of Data Reviewed Labs: ordered. Radiology: ordered.  Risk Prescription drug management.   This patient presents to the ED for concern of abdominal pain, this involves an extensive number of treatment options, and is a complaint that carries with it a high risk of complications and morbidity.  The differential diagnosis includes The causes of generalized abdominal pain include but are not limited to AAA, mesenteric ischemia, appendicitis, diverticulitis, DKA, gastritis, gastroenteritis, AMI, nephrolithiasis, pancreatitis, peritonitis, adrenal insufficiency,lead poisoning, iron toxicity, intestinal ischemia, constipation, UTI,SBO/LBO, splenic rupture, biliary disease, IBD, IBS, PUD, or hepatitis.   Co morbidities that complicate the patient evaluation  See HPI   Additional history obtained:  Additional history obtained from EMR External records from outside source obtained and reviewed including negative GC chlamydia as well as UA from 01/17/2020   Lab Tests:  I Ordered, and personally interpreted labs.  The pertinent results include: No leukocytosis.  No evidence of anemia.  No electrolyte abnormality.  Mild elevation of liver enzymes with AST of 63 and ALT of 82.  Albumin 3.3 and alk phos of 147   Imaging Studies ordered:  I ordered imaging studies including CT abdomen pelvis with contrast I independently visualized  and interpreted imaging which showed no acute finding's.  No evidence of diverticulitis.  Possible mild distal esophageal wall thickening and a small gastric fundal diverticular are noted incidentally.  Prominent disc space narrowing and endplate sclerosis at S23-T53.  Progressive bilateral foraminal narrowing at L4-5 and L5-S1 due to spondylolysis.  No acute osseous abnormalities I agree with the radiologist interpretation  Cardiac Monitoring: / EKG:  The patient was maintained on a cardiac monitor.  I personally viewed and interpreted the cardiac monitored which showed an underlying rhythm of: Sinus rhythm    Consultations Obtained:  N/a   Problem List / ED Course / Critical  interventions / Medication management  Abdominal pain I ordered medication including morphine for pain.  1 L normal saline for rehydration.   Reevaluation of the patient after these medicines showed that the patient improved I have reviewed the patients home medicines and have made adjustments as needed   Social Determinants of Health:  Denies tobacco, illicit drug use.   Test / Admission - Considered:  Abdominal pain Vitals signs within normal range and stable throughout visit. Laboratory/imaging studies significant for: See above Given significant improvement of patient's symptoms with outpatient oral antibiotics over the past several days and negative findings today on laboratory and imaging studies, patient deemed safe for discharge.  Patient educated regarding continued antibiotic therapy as still possibility that she could have had colitis although there are negative findings today.  She is no longer endorsing vomiting or diarrhea and abdominal pain has significantly decreased in intensity so further work-up deemed unnecessary at this time.  Doubt diverticulitis.  Doubt AAA.  Doubt mesenteric ischemia.  Doubt pancreatitis.  Patient recommended close follow-up with PCP in 2 to 3 days for reevaluation of  symptoms. Worrisome signs and symptoms were discussed with the patient, and the patient acknowledged understanding to return to the ED if noticed. Patient was stable upon discharge.         Final Clinical Impression(s) / ED Diagnoses Final diagnoses:  Left lower quadrant abdominal pain    Rx / DC Orders ED Discharge Orders     None         Wilnette Kales, [place] 01/19/22 1550    Carmin Muskrat, MD 01/19/22 [phone]

## 2022-01-19 NOTE — Discharge Instructions (Signed)
Note the work-up today was overall reassuring with negative imaging and laboratory studies.  Continue taking your antibiotic as directed by your primary physician because your symptoms could be secondary to colitis that has since been treated and is resolving.  Continue to take your antinausea/antivomiting medicine as needed.  Follow-up with your PCP for reevaluation in 2 to 3 days to make sure there is resolution of your symptoms.  Please not hesitate to return to the emergency department if the worrisome signs and symptoms we discussed become apparent.

## 2022-01-19 NOTE — ED Notes (Signed)
Patient ambulated to restroom without assistance to provide urine specimen. Patient returned to room and RN reapplied monitoring devices.

## 2022-01-19 NOTE — ED Triage Notes (Signed)
Pt presents with 1 wk hx of abd pain and nausea. Pt was seen at [facility] on Saturday and was told to come to the ED for imaging if pain persisted.

## 2022-01-20 ENCOUNTER — Other Ambulatory Visit: Payer: Self-pay | Admitting: Primary Care

## 2022-01-20 DIAGNOSIS — Z1231 Encounter for screening mammogram for malignant neoplasm of breast: Secondary | ICD-10-CM

## 2022-01-24 ENCOUNTER — Ambulatory Visit (INDEPENDENT_AMBULATORY_CARE_PROVIDER_SITE_OTHER): Payer: Self-pay | Admitting: *Deleted

## 2022-01-24 NOTE — Telephone Encounter (Signed)
No answer, no voicemail set up.

## 2022-01-24 NOTE — Telephone Encounter (Signed)
Summary: Rx for Covid med   Pt stated she was diagnosed with Covid and told by one of her other doctors to contact pcp for Rx for Covid medication. Pt requests that the Rx be sent to [place], [place] Phone: [phone]  Fax: [phone]      No answer and no voicemail set up.

## 2022-01-24 NOTE — Telephone Encounter (Signed)
  Chief Complaint: covid positive Symptoms: headache, mild abdominal pain, back pain, dry cough, wheezing Frequency: sx Saturday Pertinent Negatives: Patient denies  Disposition: '[]'$ ED /'[]'$ Urgent Care (no appt availability in office) / '[]'$ Appointment(In office/virtual)/ '[]'$  [place] [facility]/ '[]'$ Home Care/ '[]'$ Refused Recommended Disposition /'[]'$ [place] [facility]/ '[x]'$  Follow-up with PCP Additional Notes: no available appts. Pt is high risk. Pt requesting Paxlovid to [facility]

## 2022-01-24 NOTE — Telephone Encounter (Signed)
Summary: Rx for Covid med    Pt stated she was diagnosed with Covid and told by one of her other doctors to contact pcp for Rx for Covid medication. Pt requests that the Rx be sent to [place], [place] Phone: [phone]  Fax: [phone]      Reason for Disposition  [1] HIGH RISK patient (e.g., weak immune system, age > 47 years, obesity with BMI 30 or higher, pregnant, chronic lung disease or other chronic medical condition) AND [2] COVID symptoms (e.g., cough, fever)  (Exceptions: Already seen by PCP and no new or worsening symptoms.)  Answer Assessment - Initial Assessment Questions 1. COVID-19 DIAGNOSIS: "How do you know that you have COVID?" (e.g., positive lab test or self-test, diagnosed by doctor or NP/PA, symptoms after exposure).     Self test x 3  2. COVID-19 EXPOSURE: "Was there any known exposure to COVID before the symptoms began?" CDC Definition of close contact: within 6 feet (2 meters) for a total of 15 minutes or more over a 24-hour period.      friend 3. ONSET: "When did the COVID-19 symptoms start?"      Saturday 4. WORST SYMPTOM: "What is your worst symptom?" (e.g., cough, fever, shortness of breath, muscle aches)     Headache, abdominal pain, back pain 5. COUGH: "Do you have a cough?" If Yes, ask: "How bad is the cough?"       Yes- dry  6. FEVER: "Do you have a fever?" If Yes, ask: "What is your temperature, how was it measured, and when did it start?"     no 7. RESPIRATORY STATUS: "Describe your breathing?" (e.g., normal; shortness of breath, wheezing, unable to speak)      H/o asthma- wheezing 8. BETTER-SAME-WORSE: "Are you getting better, staying the same or getting worse compared to yesterday?"  If getting worse, ask, "In what way?"     better 9. OTHER SYMPTOMS: "Do you have any other symptoms?"  (e.g., chills, fatigue, headache, loss of smell or taste, muscle pain, sore throat)     Stomach ache went to ED,  diarrhea 10. HIGH RISK DISEASE: "Do you have any chronic medical problems?" (e.g., asthma, heart or lung disease, weak immune system, obesity, etc.)       Asthma, HTN, [place] , PA 11. VACCINE: "Have you had the COVID-19 vaccine?" If Yes, ask: "Which one, how many shots, when did you get it?"       3  12. PREGNANCY: "Is there any chance you are pregnant?" "When was your last menstrual period?"       N/a 13. O2 SATURATION MONITOR:  "Do you use an oxygen saturation monitor (pulse oximeter) at home?" If Yes, ask "What is your reading (oxygen level) today?" "What is your usual oxygen saturation reading?" (e.g., 95%)       N/a  Protocols used: Coronavirus (COVID-19) Diagnosed or Suspected-A-AH

## 2022-01-24 NOTE — Telephone Encounter (Addendum)
Did not realize pt had been reached already. Message already sent to Juluis Mire, NP. Sharyn Lull, pt wants to tell you that the drug store [facility]) closes at 6.

## 2022-01-25 ENCOUNTER — Telehealth (INDEPENDENT_AMBULATORY_CARE_PROVIDER_SITE_OTHER): Payer: Self-pay | Admitting: Primary Care

## 2022-01-25 ENCOUNTER — Other Ambulatory Visit (INDEPENDENT_AMBULATORY_CARE_PROVIDER_SITE_OTHER): Payer: Self-pay | Admitting: Primary Care

## 2022-01-25 DIAGNOSIS — U071 COVID-19: Secondary | ICD-10-CM

## 2022-01-25 MED ORDER — MOLNUPIRAVIR EUA 200MG CAPSULE: 4.0000 | ORAL_CAPSULE | Freq: Two times a day (BID) | ORAL | 0 refills | Status: AC

## 2022-01-25 NOTE — Telephone Encounter (Signed)
Copied from [place] [phone]. Topic: General - Other >> Jan 24, 2022  5:20 PM Ja-Kwan M wrote: Reason for CRM: Pt requests that message be sent to provider to advise that her pharmacy closes at 6 and she would like to request that the Rx for Covid medication be sent before it closes.

## 2022-01-25 NOTE — Telephone Encounter (Signed)
Pt called and is requesting a call back from her PCP, she says she needs this medication urgently. Pt says her rheumatologist needs to know if PCP is going to order this or if he needs to.

## 2022-01-25 NOTE — Telephone Encounter (Signed)
Spoke w/patient and informed her that Sharyn Lull is out of the office today. I am trying to get hold of provider to see if she can handle this for the patient.

## 2022-01-25 NOTE — Telephone Encounter (Signed)
Routed to pcp

## 2022-01-25 NOTE — Telephone Encounter (Signed)
Pt informed of RX being sent in.

## 2022-01-28 ENCOUNTER — Ambulatory Visit (INDEPENDENT_AMBULATORY_CARE_PROVIDER_SITE_OTHER): Payer: Medicaid Other | Admitting: Primary Care

## 2022-01-31 ENCOUNTER — Ambulatory Visit: Payer: Medicaid Other

## 2022-02-07 ENCOUNTER — Other Ambulatory Visit (INDEPENDENT_AMBULATORY_CARE_PROVIDER_SITE_OTHER): Payer: Self-pay | Admitting: Primary Care

## 2022-02-11 ENCOUNTER — Encounter (INDEPENDENT_AMBULATORY_CARE_PROVIDER_SITE_OTHER): Payer: Self-pay | Admitting: Primary Care

## 2022-02-11 ENCOUNTER — Ambulatory Visit (INDEPENDENT_AMBULATORY_CARE_PROVIDER_SITE_OTHER): Payer: Medicaid Other | Admitting: Primary Care

## 2022-02-11 VITALS — BP 129/82 | HR 104 | Temp 98.0°F | Ht 62.0 in | Wt 211.8 lb

## 2022-02-11 DIAGNOSIS — Z23 Encounter for immunization: Secondary | ICD-10-CM | POA: Diagnosis not present

## 2022-02-11 DIAGNOSIS — R21 Rash and other nonspecific skin eruption: Secondary | ICD-10-CM | POA: Diagnosis not present

## 2022-02-11 DIAGNOSIS — I10 Essential (primary) hypertension: Secondary | ICD-10-CM | POA: Diagnosis not present

## 2022-02-11 DIAGNOSIS — J45909 Unspecified asthma, uncomplicated: Secondary | ICD-10-CM

## 2022-02-11 DIAGNOSIS — Z6838 Body mass index (BMI) 38.0-38.9, adult: Secondary | ICD-10-CM

## 2022-02-11 MED ORDER — TRIAMCINOLONE ACETONIDE 0.1 % EX CREA: 1.0000 | TOPICAL_CREAM | Freq: Two times a day (BID) | CUTANEOUS | 1 refills | Status: DC

## 2022-02-11 MED ORDER — PROAIR HFA 108 (90 BASE) MCG/ACT IN AERS: INHALATION_SPRAY | RESPIRATORY_TRACT | 1 refills | Status: DC

## 2022-02-11 MED ORDER — HYPROMELLOSE (GONIOSCOPIC) 2.5 % OP SOLN: 1.0000 [drp] | Freq: Three times a day (TID) | OPHTHALMIC | 3 refills | Status: DC | PRN

## 2022-02-11 NOTE — Patient Instructions (Signed)

## 2022-02-11 NOTE — Progress Notes (Unsigned)
[place], is a 56 y.o. female  RCV:[id number]  ZWC:[id number]  DOB - 12-30-1965  Chief Complaint  Patient presents with   Hypertension       Subjective:   Heather Perry is a 56 y.o. female here today for a follow up visit. Patient has No headache, No chest pain, No abdominal pain - No Nausea, No new weakness tingling or numbness, No Cough - shortness of breath  No problems updated.  Allergies  Allergen Reactions   Hydrocodone-Acetaminophen Itching    Started itching.  Pt states that she can take this medication Other reaction(s): Other (See Comments) Pruritus   Lortab [Hydrocodone-Acetaminophen]     Started itching   Niacin Hives   Penicillin G Hives and Nausea And Vomiting    Did it involve swelling of the face/tongue/throat, SOB, or low BP? No Did it involve sudden or severe rash/hives, skin peeling, or any reaction on the inside of your mouth or nose? Yes Did you need to seek medical attention at a hospital or doctor's office? No When did it last happen?    Over 20 years Ago   If all above answers are "NO", may proceed with cephalosporin use.     Penicillins Itching    Past Medical History:  Diagnosis Date   Allergy    Anxiety    Arthritis    Asthma    Blood transfusion without reported diagnosis    Depression    GERD (gastroesophageal reflux disease)    High cholesterol    Hypertension    Obesity    Osteoporosis    Recurrent boils    Seizures ([place])    last seizure "3 years ago" per pt 06-16-16 KBW   Sleep apnea    no CPAP used   Uterine fibroid     Current Outpatient Medications on File Prior to Visit  Medication Sig Dispense Refill   AMITIZA 24 MCG capsule TAKE 1 CAPSULE (24 MCG TOTAL) BY MOUTH 2 (TWO) TIMES DAILY WITH A MEAL. 60 capsule 1   amLODipine (NORVASC) 5 MG tablet Take 1 tablet (5 mg total) by mouth daily. 90 tablet 1   aspirin EC 81 MG tablet Take 81 mg by mouth daily.     atorvastatin (LIPITOR) 20 MG  tablet Take 1 tablet (20 mg total) by mouth daily. 90 tablet 1   budesonide-formoterol (SYMBICORT) 160-4.5 MCG/ACT inhaler Inhale 2 puffs into the lungs 2 (two) times daily. (Patient taking differently: Inhale 2 puffs into the lungs 2 (two) times daily as needed (wheezing, sob).) 1 Inhaler 1   celecoxib (CELEBREX) 100 MG capsule Take 100 mg by mouth 2 (two) times daily.     clonazePAM (KLONOPIN) 1 MG tablet Take 0.5 mg by mouth 2 (two) times daily as needed for anxiety.     diclofenac Sodium (VOLTAREN) 1 % GEL APPLY 2 GRAMS TOPICALLY 4 (FOUR) TIMES DAILY. 500 g 0   DULoxetine (CYMBALTA) 60 MG capsule Take 60 mg by mouth daily.     esomeprazole (NEXIUM) 40 MG capsule TAKE ONE CAPSULE BY MOUTH ONCE DAILY AS NEEDED FOR INDIGESTION 30 capsule 1   fluticasone (FLONASE) 50 MCG/ACT nasal spray PLACE 2 SPRAYS INTO BOTH NOSTRILS DAILY. 16 g 1   gabapentin (NEURONTIN) 300 MG capsule Take 300 mg by mouth 3 (three) times daily.     hydrochlorothiazide (HYDRODIURIL) 25 MG tablet Take 1 tablet (25 mg total) by mouth daily. 90 tablet 3   hydroxychloroquine (PLAQUENIL) 200 MG tablet Take  400 mg by mouth daily.     ipratropium-albuterol (DUONEB) 0.5-2.5 (3) MG/3ML SOLN Take 3 mLs by nebulization every 6 (six) hours as needed. (Patient taking differently: Take 3 mLs by nebulization every 6 (six) hours as needed (for SOB).) 360 mL 1   leflunomide (ARAVA) 20 MG tablet Take 20 mg by mouth daily.     Misc. Devices (WALKER) MISC 1 Device by Does not apply route daily as needed. 1 each 0   ondansetron (ZOFRAN-ODT) 4 MG disintegrating tablet Take 1 tablet (4 mg total) by mouth every 8 (eight) hours as needed for nausea or vomiting. 10 tablet 0   oxyCODONE (ROXICODONE) 15 MG immediate release tablet Take 15 mg by mouth 4 (four) times daily as needed for pain.     phenytoin (DILANTIN) 100 MG ER capsule Take 100 mg by mouth 3 (three) times daily.     PROAIR HFA 108 (90 Base) MCG/ACT inhaler INHALE 2 PUFFS BY MOUTH EVERY 6  HOURS AS NEEDED FOR SHORTNESS OF BREATH OR WHEEZING. 8.5 g 1   traZODone (DESYREL) 50 MG tablet Take 0.5-1 tablets (25-50 mg total) by mouth at bedtime as needed for sleep. 30 tablet 3   No current facility-administered medications on file prior to visit.    Objective:   Vitals:   02/11/22 1128  BP: 129/82  Pulse: (!) 104  Temp: 98 F (36.7 C)  TempSrc: Oral  SpO2: 94%  Weight: 211 lb 12.8 oz (96.1 kg)  Height: '5\' 2"'$  (1.575 m)    Exam General appearance : Awake, alert, not in any distress. Speech Clear. Not toxic looking HEENT: Atraumatic and Normocephalic, pupils equally reactive to light and accomodation Neck: Supple, no JVD. No cervical lymphadenopathy.  Chest: Good air entry bilaterally, no added sounds  CVS: S1 S2 regular, no murmurs.  Abdomen: Bowel sounds present, Non tender and not distended with no gaurding, rigidity or rebound. Extremities: B/L Lower Ext shows no edema, both legs are warm to touch Neurology: Awake alert, and oriented X 3, CN II-XII intact, Non focal Skin: No Rash  Data Review Lab Results  Component Value Date   HGBA1C 5.1 10/28/2021   HGBA1C 5.3 04/25/2016    Assessment & Plan   1. Essential hypertension ***  2. Morbid obesity (HCC) ***  3. Rash and nonspecific skin eruption ***    Patient have been counseled extensively about nutrition and exercise. Other issues discussed during this visit include: low cholesterol diet, weight control and daily exercise, foot care, annual eye examinations at Ophthalmology, importance of adherence with medications and regular follow-up. We also discussed long term complications of uncontrolled diabetes and hypertension.   No follow-ups on file.  The patient was given clear instructions to go to ER or return to medical center if symptoms don't improve, worsen or new problems develop. The patient verbalized understanding. The patient was told to call to get lab results if they haven't heard anything in  the next week.   This note has been created with Surveyor, quantity. Any transcriptional errors are unintentional.   Kerin Perna, NP 02/11/2022, 11:44 AM

## 2022-02-17 ENCOUNTER — Ambulatory Visit: Payer: Medicaid Other

## 2022-03-04 ENCOUNTER — Ambulatory Visit (INDEPENDENT_AMBULATORY_CARE_PROVIDER_SITE_OTHER): Payer: Medicaid Other | Admitting: Primary Care

## 2022-03-09 ENCOUNTER — Ambulatory Visit: Payer: Medicaid Other

## 2022-03-14 ENCOUNTER — Other Ambulatory Visit (INDEPENDENT_AMBULATORY_CARE_PROVIDER_SITE_OTHER): Payer: Self-pay | Admitting: Primary Care

## 2022-03-14 DIAGNOSIS — K2100 Gastro-esophageal reflux disease with esophagitis, without bleeding: Secondary | ICD-10-CM

## 2022-03-14 DIAGNOSIS — J45909 Unspecified asthma, uncomplicated: Secondary | ICD-10-CM

## 2022-03-14 DIAGNOSIS — F5101 Primary insomnia: Secondary | ICD-10-CM

## 2022-03-15 ENCOUNTER — Other Ambulatory Visit (INDEPENDENT_AMBULATORY_CARE_PROVIDER_SITE_OTHER): Payer: Self-pay | Admitting: Primary Care

## 2022-03-15 NOTE — Telephone Encounter (Signed)
Will forward to provider  

## 2022-03-22 ENCOUNTER — Inpatient Hospital Stay: Admission: RE | Admit: 2022-03-22 | Payer: Medicaid Other | Source: Ambulatory Visit

## 2022-04-05 ENCOUNTER — Other Ambulatory Visit: Payer: Self-pay

## 2022-04-05 ENCOUNTER — Emergency Department (HOSPITAL_COMMUNITY): Payer: Medicaid Other

## 2022-04-05 ENCOUNTER — Emergency Department (HOSPITAL_COMMUNITY)
Admission: EM | Admit: 2022-04-05 | Discharge: 2022-04-05 | Disposition: A | Discharge status: Home / Self Care | Payer: Medicaid Other | Attending: Student | Admitting: Student

## 2022-04-05 DIAGNOSIS — I10 Essential (primary) hypertension: Secondary | ICD-10-CM | POA: Diagnosis not present

## 2022-04-05 DIAGNOSIS — Z79899 Other long term (current) drug therapy: Secondary | ICD-10-CM | POA: Insufficient documentation

## 2022-04-05 DIAGNOSIS — J45909 Unspecified asthma, uncomplicated: Secondary | ICD-10-CM | POA: Insufficient documentation

## 2022-04-05 DIAGNOSIS — Z7951 Long term (current) use of inhaled steroids: Secondary | ICD-10-CM | POA: Diagnosis not present

## 2022-04-05 DIAGNOSIS — Z7982 Long term (current) use of aspirin: Secondary | ICD-10-CM | POA: Insufficient documentation

## 2022-04-05 DIAGNOSIS — M5126 Other intervertebral disc displacement, lumbar region: Secondary | ICD-10-CM

## 2022-04-05 DIAGNOSIS — M5459 Other low back pain: Secondary | ICD-10-CM | POA: Diagnosis present

## 2022-04-05 DIAGNOSIS — M5440 Lumbago with sciatica, unspecified side: Secondary | ICD-10-CM

## 2022-04-05 LAB — COMPREHENSIVE METABOLIC PANEL
ALT: 30 U/L (ref 0–44)
AST: 38 U/L (ref 15–41)
Albumin: 3.9 g/dL (ref 3.5–5.0)
Alkaline Phosphatase: 126 U/L (ref 38–126)
Anion gap: 7 (ref 5–15)
BUN: 9 mg/dL (ref 6–20)
CO2: 28 mmol/L (ref 22–32)
Calcium: 9.3 mg/dL (ref 8.9–10.3)
Chloride: 103 mmol/L (ref 98–111)
Creatinine, Ser: 0.64 mg/dL (ref 0.44–1.00)
GFR, Estimated: >60 mL/min (ref >60)
Glucose, Bld: 87 mg/dL (ref 70–99)
Potassium: 3.5 mmol/L (ref 3.5–5.1)
Sodium: 138 mmol/L (ref 135–145)
Total Bilirubin: 0.4 mg/dL (ref 0.3–1.2)
Total Protein: 6.9 g/dL (ref 6.5–8.1)

## 2022-04-05 LAB — CBC WITH DIFFERENTIAL/PLATELET
Abs Immature Granulocytes: 0.01 10*3/uL (ref 0.00–0.07)
Basophils Absolute: 0 10*3/uL (ref 0.0–0.1)
Basophils Relative: 1 %
Eosinophils Absolute: 0.2 10*3/uL (ref 0.0–0.5)
Eosinophils Relative: 5 %
HCT: 39.8 % (ref 36.0–46.0)
Hemoglobin: 13.2 g/dL (ref 12.0–15.0)
Immature Granulocytes: 0 %
Lymphocytes Relative: 32 %
Lymphs Abs: 1.4 10*3/uL (ref 0.7–4.0)
MCH: 30.8 pg (ref 26.0–34.0)
MCHC: 33.2 g/dL (ref 30.0–36.0)
MCV: 93 fL (ref 80.0–100.0)
Monocytes Absolute: 0.4 10*3/uL (ref 0.1–1.0)
Monocytes Relative: 8 %
Neutro Abs: 2.4 10*3/uL (ref 1.7–7.7)
Neutrophils Relative %: 54 %
Platelets: 141 10*3/uL — ABNORMAL LOW (ref 150–400)
RBC: 4.28 MIL/uL (ref 3.87–5.11)
RDW: 13.7 % (ref 11.5–15.5)
WBC: 4.4 10*3/uL (ref 4.0–10.5)
nRBC: 0 % (ref 0.0–0.2)

## 2022-04-05 LAB — URINALYSIS, ROUTINE W REFLEX MICROSCOPIC
Bilirubin Urine: NEGATIVE
Glucose, UA: NEGATIVE mg/dL
Hgb urine dipstick: NEGATIVE
Ketones, ur: NEGATIVE mg/dL
Leukocytes,Ua: NEGATIVE
Nitrite: NEGATIVE
Protein, ur: NEGATIVE mg/dL
Specific Gravity, Urine: 1.013 (ref 1.005–1.030)
pH: 5 (ref 5.0–8.0)

## 2022-04-05 MED ORDER — ONDANSETRON HCL 4 MG/2ML IJ SOLN
4.0000 mg | Freq: Once | INTRAMUSCULAR | Status: AC
  Administered 2022-04-05: 4 mg via INTRAVENOUS
  Filled 2022-04-05: qty 2

## 2022-04-05 MED ORDER — IBUPROFEN 400 MG PO TABS
600.0000 mg | ORAL_TABLET | Freq: Once | ORAL | Status: AC
  Administered 2022-04-05: 600 mg via ORAL
  Filled 2022-04-05: qty 1

## 2022-04-05 MED ORDER — LIDOCAINE 5 % EX PTCH
1.0000 | MEDICATED_PATCH | CUTANEOUS | Status: DC
  Administered 2022-04-05: 1 via TRANSDERMAL
  Filled 2022-04-05: qty 1

## 2022-04-05 MED ORDER — LIDOCAINE 5 % EX PTCH: 1.0000 | MEDICATED_PATCH | CUTANEOUS | 0 refills | Status: DC

## 2022-04-05 MED ORDER — METHYLPREDNISOLONE 4 MG PO TBPK: ORAL_TABLET | ORAL | 0 refills | Status: DC

## 2022-04-05 MED ORDER — MORPHINE SULFATE (PF) 4 MG/ML IV SOLN
4.0000 mg | Freq: Once | INTRAVENOUS | Status: AC
  Administered 2022-04-05: 4 mg via INTRAVENOUS
  Filled 2022-04-05: qty 1

## 2022-04-05 NOTE — ED Notes (Signed)
Pt in wheel chair, pt states that she was lifting some pots at work the other day and now has some back pain.  Pt states that she has a hx of slipped disc

## 2022-04-05 NOTE — ED Notes (Signed)
Patient transported to CT 

## 2022-04-05 NOTE — ED Provider Notes (Signed)
[facility] EMERGENCY DEPARTMENT Provider Note  CSN: [id number] Arrival date & time: 04/05/22 1019  Chief Complaint(s) Back Pain  HPI Heather Perry is a 56 y.o. female with PMH anxiety, asthma, HTN, HLD, osteoporosis, chronic back pain secondary to lumbar degenerative disc disease currently following in pain clinic who presents emergency department for evaluation of worsening back pain.  Patient states that over the last 3 days she has been lifting heavy pots at work and feels that her back pain has significantly worsened.  She has been taking her home oxycodone 15 mg and has been doubling up on this medication but states that the pain only helps for about 2 hours.  Also endorsing some left leg pain and is worried that her potassium is low.  Denies numbness, weakness, saddle anesthesia, urinary retention or incontinence, bowel incontinence or other red flag signs of back pain.  Denies chest pain, shortness of breath, headache, fever or other systemic symptoms.  No current IV drug use or history of IV drug use.   Past Medical History Past Medical History:  Diagnosis Date   Allergy    Anxiety    Arthritis    Asthma    Blood transfusion without reported diagnosis    Depression    GERD (gastroesophageal reflux disease)    High cholesterol    Hypertension    Obesity    Osteoporosis    Recurrent boils    Seizures ([place])    last seizure "3 years ago" per pt 06-16-16 KBW   Sleep apnea    no CPAP used   Uterine fibroid    Patient Active Problem List   Diagnosis Date Noted   Lumbar degenerative disc disease 07/28/2020   Anxiety disorder 06/28/2017   MDD (major depressive disorder), recurrent episode, mild ([place]) 06/28/2017   Essential hypertension 06/26/2017   Hyperlipidemia with target LDL less than 100 06/26/2017   Chest pain with low risk for cardiac etiology 06/26/2017   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 Authorizing Provider  AMITIZA 24 MCG capsule TAKE 1 CAPSULE (24 MCG TOTAL) BY MOUTH 2 (TWO) TIMES DAILY WITH A MEAL. 03/11/21   Raulkar, Clide Deutscher, MD  amLODipine (NORVASC) 5 MG tablet Take 1 tablet (5 mg total) by mouth daily. 10/28/21   Kerin Perna, NP  aspirin EC 81 MG tablet Take 81 mg by mouth daily.    [provider]  atorvastatin (LIPITOR) 20 MG tablet Take 1 tablet (20 mg total) by mouth daily. 11/05/21   Kerin Perna, NP  budesonide-formoterol (SYMBICORT) 160-4.5 MCG/ACT inhaler Inhale 2 puffs into the lungs 2 (two) times daily. Patient taking differently: Inhale 2 puffs into the lungs 2 (two) times daily as needed (wheezing, sob). 05/18/16   Micheline Chapman, NP  celecoxib (CELEBREX) 100 MG capsule Take 100 mg by mouth 2 (two) times daily. 12/24/21   [provider]  clonazePAM (KLONOPIN) 1 MG tablet Take 0.5 mg by mouth 2 (two) times daily as needed for anxiety. 12/15/21   [provider]  diclofenac Sodium (VOLTAREN) 1 % GEL APPLY 2 GRAMS TOPICALLY 4 (FOUR) TIMES DAILY. 03/11/21   Raulkar, Clide Deutscher, MD  esomeprazole (NEXIUM) 40 MG capsule TAKE ONE CAPSULE BY MOUTH ONCE DAILY AS NEEDED FOR INDIGESTION 03/15/22   Kerin Perna, NP  fluticasone Asencion Islam)  50 MCG/ACT nasal spray PLACE 2 SPRAYS INTO BOTH NOSTRILS DAILY. 02/07/22   Kerin Perna, NP  gabapentin (NEURONTIN) 300 MG capsule Take 300 mg by mouth 3 (three) times daily. 12/24/21   [provider]  hydrochlorothiazide (HYDRODIURIL) 25 MG tablet Take 1 tablet (25 mg total) by mouth daily. 10/28/21   Kerin Perna, NP  hydroxychloroquine (PLAQUENIL) 200 MG tablet Take 400 mg by mouth daily. 03/21/17   [provider]  hydroxypropyl methylcellulose / hypromellose (ISOPTO TEARS / GONIOVISC) 2.5 % ophthalmic solution Place 1 drop into  both eyes 3 (three) times daily as needed for dry eyes. 02/11/22   Kerin Perna, NP  ipratropium-albuterol (DUONEB) 0.5-2.5 (3) MG/3ML SOLN Take 3 mLs by nebulization every 6 (six) hours as needed. Patient taking differently: Take 3 mLs by nebulization every 6 (six) hours as needed (for SOB). 05/18/16   Micheline Chapman, NP  leflunomide (ARAVA) 20 MG tablet Take 20 mg by mouth daily. 11/11/19   [provider]  Misc. Devices (WALKER) MISC 1 Device by Does not apply route daily as needed. 07/03/21   Hazel Sams, PA-C  ondansetron (ZOFRAN-ODT) 4 MG disintegrating tablet Take 1 tablet (4 mg total) by mouth every 8 (eight) hours as needed for nausea or vomiting. 01/16/22   Barrett Henle, MD  oxyCODONE (ROXICODONE) 15 MG immediate release tablet Take 15 mg by mouth 4 (four) times daily as needed for pain. 12/29/21   [provider]  phenytoin (DILANTIN) 100 MG ER capsule Take 100 mg by mouth 3 (three) times daily.    [provider]  traZODone (DESYREL) 50 MG tablet TAKE 1/2 TO 1 TABLET BY MOUTH AT BEDTIME AS NEEDED FOR SLEEP. 03/15/22   Kerin Perna, NP  triamcinolone cream (KENALOG) 0.1 % Apply 1 Application topically 2 (two) times daily. 02/11/22   Kerin Perna, NP  VENTOLIN HFA 108 (90 Base) MCG/ACT inhaler INHALE 2 PUFFS BY MOUTH EVERY 6 HOURS AS NEEDED FOR SHORTNESS OF BREATH OR WHEEZING. 03/15/22   Kerin Perna, NP                                                                                                                                    Past Surgical History Past Surgical History:  Procedure Laterality Date   ABDOMINAL HYSTERECTOMY     CESAREAN SECTION     KNEE SURGERY Right    x2   UPPER GASTROINTESTINAL ENDOSCOPY     Family History Family History  Problem Relation Age of Onset   Breast cancer Maternal Aunt        unsure how old of onset   Breast cancer Maternal Aunt        unsure how old of onset   Colon cancer Neg Hx     Esophageal cancer Neg Hx    Rectal cancer Neg Hx    Stomach cancer Neg Hx  Social History Social History   Tobacco Use   Smoking status: Never   Smokeless tobacco: Never  Vaping Use   Vaping Use: Never used  Substance Use Topics   Alcohol use: No   Drug use: No    Types: "Crack" cocaine    Comment: last smoked 2011   Allergies Hydrocodone-acetaminophen, Lortab [hydrocodone-acetaminophen], Niacin, Penicillin g, and Penicillins  Review of Systems Review of Systems  Musculoskeletal:  Positive for back pain.    Physical Exam Vital Signs  I have reviewed the triage vital signs BP 134/89 (BP Location: Right Arm)   Pulse 82   Temp 98.1 F (36.7 C) (Oral)   Resp 18   SpO2 98%   Physical Exam Vitals and nursing note reviewed.  Constitutional:      General: She is not in acute distress.    Appearance: She is well-developed.  HENT:     Head: Normocephalic and atraumatic.  Eyes:     Conjunctiva/sclera: Conjunctivae normal.  Cardiovascular:     Rate and Rhythm: Normal rate and regular rhythm.     Heart sounds: No murmur heard. Pulmonary:     Effort: Pulmonary effort is normal. No respiratory distress.     Breath sounds: Normal breath sounds.  Abdominal:     Palpations: Abdomen is soft.     Tenderness: There is no abdominal tenderness.  Musculoskeletal:        General: Tenderness present. No swelling.     Cervical back: Neck supple.  Skin:    General: Skin is warm and dry.     Capillary Refill: Capillary refill takes less than 2 seconds.  Neurological:     Mental Status: She is alert.  Psychiatric:        Mood and Affect: Mood normal.     ED Results and Treatments Labs (all labs ordered are listed, but only abnormal results are displayed) Labs Reviewed  CBC WITH DIFFERENTIAL/PLATELET  COMPREHENSIVE METABOLIC PANEL  URINALYSIS, ROUTINE W REFLEX MICROSCOPIC                                                                                                                           Radiology CT Lumbar Spine Wo Contrast  Result Date: 04/05/2022 CLINICAL DATA:  Low back pain.  Trauma. EXAM: CT LUMBAR SPINE WITHOUT CONTRAST TECHNIQUE: Multidetector CT imaging of the lumbar spine was performed without intravenous contrast administration. Multiplanar CT image reconstructions were also generated. RADIATION DOSE REDUCTION: This exam was performed according to the departmental dose-optimization program which includes automated exposure control, adjustment of the mA and/or kV according to patient size and/or use of iterative reconstruction technique. COMPARISON:  Lumbar spine radiographs same date. Abdominopelvic CT 01/19/2022 FINDINGS: Segmentation: There are 5 lumbar type vertebral bodies. Alignment: Grade 1 anterolisthesis at L4-5 and retrolisthesis at L5-S1, similar to previous abdominopelvic CT. Vertebrae: No evidence of acute fracture or pars defect Paraspinal and other soft tissues: No significant paraspinal findings. Disc levels: At T12-L1 and L1-2,  the disc height is maintained. There is no evidence of spinal stenosis. L2-3: Mild disc bulging and facet hypertrophy. No significant spinal stenosis. L3-4: Preserved disc height with mild disc bulging eccentric to the left. Mild facet and ligamentous hypertrophy. No significant spinal stenosis. L4-5: Moderate loss of disc height with annular disc bulging and small endplate osteophytes. Advanced facet and ligamentous hypertrophy with bilateral vacuum phenomenon. Resulting anterolisthesis contributes to moderate multifactorial spinal stenosis with moderate lateral recess and foraminal narrowing bilaterally. L5-S1: Chronic degenerative disc disease with loss of disc height, vacuum phenomenon and endplate osteophytes. Possible chronic but enlarging extruded disc fragment in the left lateral recess containing vacuum phenomena with resulting left S1 nerve root encroachment. Moderate facet hypertrophy with associated chronic  severe foraminal narrowing bilaterally. IMPRESSION: 1. No evidence of acute lumbar spine fracture or traumatic subluxation. 2. 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 3. Possible chronic but enlarging extruded disc fragment in the left lateral recess at L5-S1 with resulting left S1 nerve root encroachment. Chronic severe foraminal narrowing bilaterally at L5-S1. Electronically Signed   By: Richardean Sale M.D.   On: 04/05/2022 15:01   DG Lumbar Spine Complete  Result Date: 04/05/2022 CLINICAL DATA:  Back pain EXAM: LUMBAR SPINE - COMPLETE 4+ VIEW COMPARISON:  04/30/2020 FINDINGS: No recent fracture is seen. Degenerative changes are noted, particularly severe in the facet joints at L4-L5 and L5-S1 levels. There is disc space narrowing at the L4-L5 and L5-S1 levels. Large anterior bony spurs are seen at L5-S1 level. There is interval appearance of 3 mm anterolisthesis at L4-L5 level. There is minimal 2-3 mm retrolisthesis at L3-L4 level. There is no definite evidence of spondylolysis. IMPRESSION: No recent fracture is seen. Marked degenerative changes are noted in facet joints at L4-L5 and L5-S1 levels. There is interval appearance of 3 mm anterolisthesis at L4-L5 level. There is minimal 2-3 mm retrolisthesis at L3-L4 level. These findings may be related to facet degeneration. Electronically Signed   By: Elmer Picker M.D.   On: 04/05/2022 11:48    Pertinent labs & imaging results that were available during my care of the patient were reviewed by me and considered in my medical decision making (see MDM for details).  Medications Ordered in ED Medications  lidocaine (LIDODERM) 5 % 1 patch (1 patch Transdermal Patch Applied 04/05/22 1436)  ibuprofen (ADVIL) tablet 600 mg (600 mg Oral Given 04/05/22 1145)  morphine (PF) 4 MG/ML injection 4 mg (4 mg Intravenous Given 04/05/22  1435)  ondansetron (ZOFRAN) injection 4 mg (4 mg Intravenous Given 04/05/22 1433)                                                                                                                                     Procedures Procedures  (including critical care time)  Medical Decision Making / ED Course  This patient presents to the ED for concern of back pain, this involves an extensive number of treatment options, and is a complaint that carries with it a high risk of complications and morbidity.  The differential diagnosis includes musculoskeletal strain, slipped disc, spinal stenosis, degenerative disc disease, cauda equina  MDM: Patient seen in the emergency room for evaluation of back pain.  Physical exam with tenderness in the L-spine but no focal motor or sensory deficits.  Initial x-ray imaging with chronic degenerative changes and follow-up CT with facet arthropathy at L4-L5, L5-S1 with moderate multifactorial spinal stenosis of both levels and chronic but enlarging extruded disc fragment in the left lateral recess at L5-S1 with left S1 nerve root encroachment.  At time of signout, patient is pending her laboratory evaluation as patient is requesting a potassium check.  Patient will be safe for discharge if pain controlled and would likely benefit from steroid taper with neurosurgery follow-up.  Please see provider signout for continuation of work-up.   Additional history obtained:  -External records from outside source obtained and reviewed including: Chart review including previous notes, labs, imaging, consultation notes   Lab Tests: -I ordered, reviewed, and interpreted labs.   The pertinent results include:   Labs Reviewed  CBC WITH DIFFERENTIAL/PLATELET  COMPREHENSIVE METABOLIC PANEL  URINALYSIS, ROUTINE W REFLEX MICROSCOPIC        Imaging Studies ordered: I ordered imaging studies including CT L-spine I independently visualized and interpreted imaging. I agree  with the radiologist interpretation   Medicines ordered and prescription drug management: Meds ordered this encounter  Medications   ibuprofen (ADVIL) tablet 600 mg   morphine (PF) 4 MG/ML injection 4 mg   ondansetron (ZOFRAN) injection 4 mg   lidocaine (LIDODERM) 5 % 1 patch    -I have reviewed the patients home medicines and have made adjustments as needed  Critical interventions none    Social Determinants of Health:  Factors impacting patients care include: none   Reevaluation: After the interventions noted above, I reevaluated the patient and found that they have :improved  Co morbidities that complicate the patient evaluation  Past Medical History:  Diagnosis Date   Allergy    Anxiety    Arthritis    Asthma    Blood transfusion without reported diagnosis    Depression    GERD (gastroesophageal reflux disease)    High cholesterol    Hypertension    Obesity    Osteoporosis    Recurrent boils    Seizures ([place])    last seizure "3 years ago" per pt 06-16-16 KBW   Sleep apnea    no CPAP used   Uterine fibroid       Dispostion: I considered admission for this patient, and disposition pending laboratory evaluation.  Please see provider signout for continuation of work-up.     Final Clinical Impression(s) / ED Diagnoses Final diagnoses:  None     '@PCDICTATION'$ @    Teressa Lower, MD 04/05/22 1515

## 2022-04-05 NOTE — ED Triage Notes (Signed)
EMS stated, back pain and takes Hydrocodone '15mg'$ /'325mg'$ . And goes to pain clinic. Took one at 0800.

## 2022-04-05 NOTE — ED Provider Triage Note (Signed)
Emergency Medicine Provider Triage Evaluation Note  Heather Perry , a 56 y.o. female  was evaluated in triage.  Pt complains of lower back pain.  States that she has had a long history of lower back pain.  Her back pain is managed by pain clinic.  She takes 15 mg tablets of oxycodone 4 times daily.  States that on Saturday she was picking up pots from the ground and hurt her back again.  Pain is located in her lower back pain in the middle of her back and extending to the left and right.  Times it radiates down her right leg.  Denies saddle anesthesia, drug use, fever, recent back surgery. She also mentioned that she has been constipated.  She has been taking 3 laxatives a day for the past couple days and still has not had a good bowel movement.  Review of Systems  Positive: See above Negative: See above  Physical Exam  There were no vitals taken for this visit. Gen:   Awake, no distress   Resp:  Normal effort  MSK:   Moves extremities without difficulty  Other:    Medical Decision Making  Medically screening exam initiated at 11:11 AM.  Appropriate orders placed.  Srah Ake was informed that the remainder of the evaluation will be completed by another provider, this initial triage assessment does not replace that evaluation, and the importance of remaining in the ED until their evaluation is complete.  Ibuprofen and lumbar spine x-ray   Harriet Pho, PA-C 04/05/22 1115

## 2022-04-05 NOTE — ED Provider Notes (Signed)
  Physical Exam  BP 134/89 (BP Location: Right Arm)   Pulse 82   Temp 98.1 F (36.7 C) (Oral)   Resp 18   SpO2 98%   Physical Exam  Procedures  Procedures  ED Course / MDM    Medical Decision Making Amount and/or Complexity of Data Reviewed Labs: ordered. Radiology: ordered.  Risk Prescription drug management.   ***

## 2022-04-06 ENCOUNTER — Other Ambulatory Visit (INDEPENDENT_AMBULATORY_CARE_PROVIDER_SITE_OTHER): Payer: Self-pay | Admitting: Primary Care

## 2022-04-06 NOTE — Telephone Encounter (Signed)
Medication Refill - Medication:  celecoxib (CELEBREX) 100 MG capsule  phenytoin (DILANTIN) 100 MG ER capsule  Has the patient contacted their pharmacy? No.  Preferred Pharmacy (with phone number or street name):  [facility], [place] Phone:  [phone]  Fax:  [phone]     Has the patient been seen for an appointment in the last year OR does the patient have an upcoming appointment? Yes.    Agent: Please be advised that RX refills may take up to 3 business days. We ask that you follow-up with your pharmacy.

## 2022-04-06 NOTE — Telephone Encounter (Signed)
Requested medication (s) are due for refill today: -  Requested medication (s) are on the active medication list: both historical meds  Last refill:  both meds: 01/19/22  Future visit scheduled: no  Notes to clinic:  celecoxib: hx med and provider Dilantin: med not delegated to NT to RF- historical med and provider   Requested Prescriptions  Pending Prescriptions Disp Refills   celecoxib (CELEBREX) 100 MG capsule      Sig: Take 1 capsule (100 mg total) by mouth 2 (two) times daily.     Analgesics:  COX2 Inhibitors Failed - 04/06/2022  1:15 PM      Failed - Manual Review: Labs are only required if the patient has taken medication for more than 8 weeks.      Passed - HGB in normal range and within 360 days    Hemoglobin  Date Value Ref Range Status  04/05/2022 13.2 12.0 - 15.0 g/dL Final  01/11/2017 12.8 11.1 - 15.9 g/dL Final         Passed - Cr in normal range and within 360 days    Creat  Date Value Ref Range Status  04/25/2016 0.72 0.50 - 1.05 mg/dL Final    Comment:      For patients > or = 58 years of age: The upper reference limit for Creatinine is approximately 13% higher for people identified as African-American.      Creatinine, Ser  Date Value Ref Range Status  04/05/2022 0.64 0.44 - 1.00 mg/dL Final         Passed - HCT in normal range and within 360 days    HCT  Date Value Ref Range Status  04/05/2022 39.8 36.0 - 46.0 % Final   Hematocrit  Date Value Ref Range Status  01/11/2017 38.4 34.0 - 46.6 % Final         Passed - AST in normal range and within 360 days    AST  Date Value Ref Range Status  04/05/2022 38 15 - 41 U/L Final         Passed - ALT in normal range and within 360 days    ALT  Date Value Ref Range Status  04/05/2022 30 0 - 44 U/L Final         Passed - eGFR is 30 or above and within 360 days    GFR, Est African American  Date Value Ref Range Status  04/25/2016 >89 >=60 mL/min Final   GFR calc Af Amer  Date Value Ref Range  Status  08/19/2017 >60 >60 mL/min Final    Comment:    (NOTE) The eGFR has been calculated using the CKD EPI equation. This calculation has not been validated in all clinical situations. eGFR's persistently <60 mL/min signify possible Chronic Kidney Disease.    GFR, Est Non African American  Date Value Ref Range Status  04/25/2016 >89 >=60 mL/min Final   GFR, Estimated  Date Value Ref Range Status  04/05/2022 >60 >60 mL/min Final    Comment:    (NOTE) Calculated using the CKD-EPI Creatinine Equation (2021)          Passed - Patient is not pregnant      Passed - Valid encounter within last 12 months    Recent Outpatient Visits           1 month ago Essential hypertension   [place] Kerin Perna, NP   4 months ago Exposure to sexually transmitted disease (STD)  [place] [facility] Kerin Perna, NP   5 months ago Vaginal itching   [place] Kerin Perna, NP   5 years ago Lumbar degenerative disc disease   [place] Clent Demark, PA-C   5 years ago Annual physical exam   [place] Clent Demark, PA-C       Future Appointments             In 1 month Oletta Lamas, Milford Cage, NP [facility]             phenytoin (DILANTIN) 100 MG ER capsule      Sig: Take 1 capsule (100 mg total) by mouth 3 (three) times daily.     Not Delegated - Neurology:  Anticonvulsants - phenytoin Failed - 04/06/2022  1:15 PM      Failed - This refill cannot be delegated      Failed - PLT in normal range and within 360 days    Platelets  Date Value Ref Range Status  04/05/2022 141 (L) 150 - 400 K/uL Final  01/11/2017 173 150 - 379 x10E3/uL Final         Failed - Phenytoin (serum) in normal range and within 360 days    No results found for: "PHENYTOIN", "PHENYTFREE", "PHENYTOINBD", "PHENPERCFREE"       Passed - ALT in  normal range and within 360 days    ALT  Date Value Ref Range Status  04/05/2022 30 0 - 44 U/L Final         Passed - AST in normal range and within 360 days    AST  Date Value Ref Range Status  04/05/2022 38 15 - 41 U/L Final         Passed - HGB in normal range and within 360 days    Hemoglobin  Date Value Ref Range Status  04/05/2022 13.2 12.0 - 15.0 g/dL Final  01/11/2017 12.8 11.1 - 15.9 g/dL Final         Passed - HCT in normal range and within 360 days    HCT  Date Value Ref Range Status  04/05/2022 39.8 36.0 - 46.0 % Final   Hematocrit  Date Value Ref Range Status  01/11/2017 38.4 34.0 - 46.6 % Final         Passed - WBC in normal range and within 360 days    WBC  Date Value Ref Range Status  04/05/2022 4.4 4.0 - 10.5 K/uL Final         Passed - Completed PHQ-2 or PHQ-9 in the last 360 days      Passed - Patient is not pregnant      Passed - Valid encounter within last 12 months    Recent Outpatient Visits           1 month ago Essential hypertension   [place], Michelle P, NP   4 months ago Exposure to sexually transmitted disease (STD)   [place], Michelle P, NP   5 months ago Vaginal itching   [place] Kerin Perna, NP   5 years ago Lumbar degenerative disc disease   [place], Roger David, PA-C   5 years ago Annual physical exam   [place] Clent Demark, PA-C  Future Appointments             In 1 month [place], Milford Cage, NP [place]

## 2022-04-08 NOTE — Telephone Encounter (Signed)
Will forward to provider  

## 2022-04-10 MED ORDER — CELECOXIB 100 MG PO CAPS: 100.0000 mg | ORAL_CAPSULE | Freq: Two times a day (BID) | ORAL | 1 refills | Status: DC

## 2022-04-11 ENCOUNTER — Ambulatory Visit (INDEPENDENT_AMBULATORY_CARE_PROVIDER_SITE_OTHER): Payer: Medicaid Other | Admitting: Primary Care

## 2022-04-11 ENCOUNTER — Encounter (INDEPENDENT_AMBULATORY_CARE_PROVIDER_SITE_OTHER): Payer: Self-pay | Admitting: Primary Care

## 2022-04-11 ENCOUNTER — Ambulatory Visit (INDEPENDENT_AMBULATORY_CARE_PROVIDER_SITE_OTHER): Payer: Self-pay | Admitting: *Deleted

## 2022-04-11 VITALS — BP 138/84 | HR 94 | Resp 16 | Wt 209.0 lb

## 2022-04-11 DIAGNOSIS — M5440 Lumbago with sciatica, unspecified side: Secondary | ICD-10-CM

## 2022-04-11 DIAGNOSIS — G8929 Other chronic pain: Secondary | ICD-10-CM

## 2022-04-11 DIAGNOSIS — Z09 Encounter for follow-up examination after completed treatment for conditions other than malignant neoplasm: Secondary | ICD-10-CM | POA: Diagnosis not present

## 2022-04-11 DIAGNOSIS — I10 Essential (primary) hypertension: Secondary | ICD-10-CM

## 2022-04-11 NOTE — Patient Instructions (Signed)
Herniated Disk  A herniated disk happens when a disk in the spine bulges out too far. There is an oval disk between each pair of bones (vertebrae) in the backbone or spine. The disks connect the bones, help the spine move, and keep the bones from rubbing against each other when you move. A herniated disk can happen anywhere in the back or neck area. It most often affects the lower back. What are the causes? This condition may be caused by: Wear and tear as you age. Sudden injury, such as a strain or sprain. What increases the risk? The following factors may make you more likely to develop this condition: Age. The older you are the higher the risk. Being a man who is 46-52 years old. Doing activities that involve heavy lifting, bending, or twisting. Not getting enough exercise. Being overweight. Smoking or using tobacco. What are the signs or symptoms? Symptoms may vary depending on where the herniated disk is in your body. Sharp pain in the arm, hip, butt, or in the lower back. The pain can spread to the leg and foot. Dizziness. A feeling that things are moving when they are not (vertigo). Pain or weakness in the neck, shoulder, upper or lower arm, or fingers. Muscle weakness. You may not be able to: Lift your arm or leg. Stand on your toes. Squeeze with your hands. Loss of feeling (numbness) or tingling in the hands, arms, feet, or legs. Being unable to control when to poop or pee. This is rare but serious. How is this treated? This condition may be treated with: Resting for a few days or several weeks. Do not do things that need a lot of effort. Do not go into complete bed rest. Do only light activities. If you have a herniated disk in your lower back, limit how much you sit. Sitting puts more pressure on the disk. Medicines for pain, swelling, or tense muscles. Ice or heat therapy. Steroid shots. These can reduce pain and swelling. Physical therapy to strengthen your  back. Follow these instructions at home: Medicines Take over-the-counter and prescription medicines only as told by your doctor. If told, take steps to prevent problems with pooping (constipation). You may need to: Drink enough fluid to keep your pee (urine) pale yellow. Take over-the-counter or prescription medicines. Eat foods that are high in fiber. These include beans, whole grains, and fresh fruits and vegetables. Limit foods that are high in fat and processed sugars. These include fried or sweet foods. Ask your doctor if you should avoid driving or using machines while you are taking your medicines. Managing pain, stiffness, and swelling     If told, put ice on the affected area. To do this: Put ice in a plastic bag. Place a towel between your skin and the bag. Leave the ice on for 20 minutes, 2-3 times a day. If told, put heat on the painful area. Use the heat source that your doctor recommends, such as a moist heat pack or a heating pad. Place a towel between your skin and the heat source. Leave the heat on for 20-30 minutes. Take off the heat or ice if your skin turns bright red. If you cannot feel pain, heat, or cold, you have a greater risk of getting burned. Activity Rest as told by your doctor. Avoid strict bed rest. Do only activities that do not cause pain. After your rest period: Return to your normal activities as told by your doctor. Slowly start doing exercises as  told. Ask your doctor what activities and exercises are safe for you. Use good posture. Avoid movements that cause pain. Do not lift anything that is heavier than 10 lb (4.5 kg), or the limit that you are told. Do not sit or stand for a long time without moving. Do not sit for a long time without getting up and moving around. If exercises (physical therapy) were prescribed, do them as told by your doctor. Try to strengthen your back and belly with exercises such as swimming or walking. General  instructions Do not smoke or use any products that contain nicotine or tobacco. If you need help quitting, ask your doctor. Do not wear high-heeled shoes. Do not sleep on your belly. If you are overweight, work with your doctor to lose weight safely. Keep all follow-up visits. How is this prevented? Stay at a healthy weight. Stay in shape. Do at least 150 minutes of moderate-intensity exercise each week, such as fast walking or water aerobics. When lifting objects: Keep your feet as far apart as your shoulders or farther apart. Tighten your belly muscles. Bend your knees and hips, and keep your spine neutral. Lift using the strength of your legs, not your back. Do not lock your knees straight out. Always ask for help to lift heavy or large objects. Contact a doctor if: You have back pain or neck pain that does not get better after 6 weeks. You have very bad pain in your back, neck, legs, or arms. You get any of these problems in any part of your body: Tingling. Weakness. Loss of feeling. Get help right away if: You cannot move your arms or legs. You cannot control when you pee or poop. You feel dizzy. You faint. You have trouble breathing. These symptoms may be an emergency. Get help right away. Call your local emergency services (911 in the [place] Do not wait to see if the symptoms will go away. Do not drive yourself to the hospital. Summary A herniated disk happens when a disk in your backbone bulges out too far. This condition may be caused by wear and tear as you age or a sudden injury. Symptoms may vary depending on where your herniated disk is in your body. Treatment may include rest, medicines, ice or heat therapy, steroid shots, and exercises. This information is not intended to replace advice given to you by your health care provider. Make sure you discuss any questions you have with your health care provider. Document Revised: 09/25/2019 Document Reviewed:  09/25/2019 Elsevier Patient Education  [place].

## 2022-04-11 NOTE — Progress Notes (Signed)
Heather Perry, is a 56 y.o. female  LOV:[id number]  OAC:[id number]  DOB - 1965/09/15  Chief Complaint  Patient presents with   Hospitalization Follow-up    ED       Subjective:   Heather Perry is a 56 y.o. female here today for a follow up visit.from the emergency room she was seen on 04/05/2022 for Acute low back pain with sciatica, sciatica laterality unspecified, unspecified back pain laterality Lumbar disc herniationpatient .  She presents to the office with abnormal gait and a cane complaining of back pain and the patches lidocaine do not stay on and he has to replace them and runs out requesting refills.  Explained to patient her pain is manage by pain management and unable to even prescribe Tylenol any additional medication or refills she needs to contact pain clinic office.  Patient was a little irate because she was told to follow-up with her PCP which was correct but she was also told to Follow up with Pa, Royal Kunia (Neurosurgery); or your neurosurgeon and pain physicianfollow-up.  Patient also requested a refill on her Dilantin explained that needs to be requested from neurology or prescriber.  Today she only complains of back pain and has No headache, No chest pain, No abdominal pain - No Nausea, No new weakness tingling or numbness, No Cough - shortness of breath  No problems updated.  Allergies  Allergen Reactions   Hydrocodone-Acetaminophen Itching    Started itching.  Pt states that she can take this medication Other reaction(s): Other (See Comments) Pruritus   Lortab [Hydrocodone-Acetaminophen]     Started itching   Niacin Hives   Penicillin G Hives and Nausea And Vomiting    Did it involve swelling of the face/tongue/throat, SOB, or low BP? No Did it involve sudden or severe rash/hives, skin peeling, or any reaction on the inside of your mouth or nose? Yes Did you need to seek medical attention at a  hospital or doctor's office? No When did it last happen?    Over 20 years Ago   If all above answers are "NO", may proceed with cephalosporin use.     Penicillins Itching    Past Medical History:  Diagnosis Date   Allergy    Anxiety    Arthritis    Asthma    Blood transfusion without reported diagnosis    Depression    GERD (gastroesophageal reflux disease)    High cholesterol    Hypertension    Obesity    Osteoporosis    Recurrent boils    Seizures ([place])    last seizure "3 years ago" per pt 06-16-16 KBW   Sleep apnea    no CPAP used   Uterine fibroid     Current Outpatient Medications on File Prior to Visit  Medication Sig Dispense Refill   AMITIZA 24 MCG capsule TAKE 1 CAPSULE (24 MCG TOTAL) BY MOUTH 2 (TWO) TIMES DAILY WITH A MEAL. 60 capsule 1   amLODipine (NORVASC) 5 MG tablet Take 1 tablet (5 mg total) by mouth daily. 90 tablet 1   aspirin EC 81 MG tablet Take 81 mg by mouth daily.     atorvastatin (LIPITOR) 20 MG tablet Take 1 tablet (20 mg total) by mouth daily. 90 tablet 1   budesonide-formoterol (SYMBICORT) 160-4.5 MCG/ACT inhaler Inhale 2 puffs into the lungs 2 (two) times daily. (Patient taking differently: Inhale 2 puffs into the lungs 2 (two) times daily as needed (wheezing,  sob).) 1 Inhaler 1   celecoxib (CELEBREX) 100 MG capsule Take 1 capsule (100 mg total) by mouth 2 (two) times daily. 180 capsule 1   clonazePAM (KLONOPIN) 1 MG tablet Take 0.5 mg by mouth 2 (two) times daily as needed for anxiety.     diclofenac Sodium (VOLTAREN) 1 % GEL APPLY 2 GRAMS TOPICALLY 4 (FOUR) TIMES DAILY. 500 g 0   esomeprazole (NEXIUM) 40 MG capsule TAKE ONE CAPSULE BY MOUTH ONCE DAILY AS NEEDED FOR INDIGESTION 30 capsule 1   fluticasone (FLONASE) 50 MCG/ACT nasal spray PLACE 2 SPRAYS INTO BOTH NOSTRILS DAILY. 16 g 1   gabapentin (NEURONTIN) 300 MG capsule Take 300 mg by mouth 3 (three) times daily.     hydrochlorothiazide (HYDRODIURIL) 25 MG tablet Take 1 tablet (25 mg total)  by mouth daily. 90 tablet 3   hydroxychloroquine (PLAQUENIL) 200 MG tablet Take 400 mg by mouth daily.     hydroxypropyl methylcellulose / hypromellose (ISOPTO TEARS / GONIOVISC) 2.5 % ophthalmic solution Place 1 drop into both eyes 3 (three) times daily as needed for dry eyes. 15 mL 3   ipratropium-albuterol (DUONEB) 0.5-2.5 (3) MG/3ML SOLN Take 3 mLs by nebulization every 6 (six) hours as needed. (Patient taking differently: Take 3 mLs by nebulization every 6 (six) hours as needed (for SOB).) 360 mL 1   leflunomide (ARAVA) 20 MG tablet Take 20 mg by mouth daily.     lidocaine (LIDODERM) 5 % Place 1 patch onto the skin daily. Remove & Discard patch within 12 hours or as directed by MD 30 patch 0   methylPREDNISolone (MEDROL DOSEPAK) 4 MG TBPK tablet Follow instructions on package 21 each 0   Misc. Devices (WALKER) MISC 1 Device by Does not apply route daily as needed. 1 each 0   ondansetron (ZOFRAN-ODT) 4 MG disintegrating tablet Take 1 tablet (4 mg total) by mouth every 8 (eight) hours as needed for nausea or vomiting. 10 tablet 0   oxyCODONE (ROXICODONE) 15 MG immediate release tablet Take 15 mg by mouth 4 (four) times daily as needed for pain.     phenytoin (DILANTIN) 100 MG ER capsule Take 100 mg by mouth 3 (three) times daily.     traZODone (DESYREL) 50 MG tablet TAKE 1/2 TO 1 TABLET BY MOUTH AT BEDTIME AS NEEDED FOR SLEEP. 30 tablet 3   triamcinolone cream (KENALOG) 0.1 % Apply 1 Application topically 2 (two) times daily. 80 g 1   VENTOLIN HFA 108 (90 Base) MCG/ACT inhaler INHALE 2 PUFFS BY MOUTH EVERY 6 HOURS AS NEEDED FOR SHORTNESS OF BREATH OR WHEEZING. 18 g 1   No current facility-administered medications on file prior to visit.    Objective:   Vitals:   04/11/22 1610  BP: 138/84  Pulse: 94  Resp: 16  SpO2: 99%  Weight: 209 lb (94.8 kg)    Exam General appearance : Awake, alert, not in any distress. Speech Clear. Not toxic looking HEENT: Atraumatic and Normocephalic, pupils  equally reactive to light and accomodation Neck: Supple, no JVD. No cervical lymphadenopathy.  Chest: Good air entry bilaterally, no added sounds  CVS: S1 S2 regular, no murmurs.  Abdomen: Bowel sounds present, Non tender and not distended with no gaurding, rigidity or rebound. Extremities: B/L Lower Ext shows no edema, both legs are warm to touch-abnormal gait Neurology: Awake alert, and oriented X 3,  Non focal Skin: No Rash  Data Review Lab Results  Component Value Date   HGBA1C 5.1 10/28/2021  HGBA1C 5.3 04/25/2016    Assessment & Plan  Heather Perry was seen today for hospitalization follow-up.  Diagnoses and all orders for this visit:  Essential hypertension Blood pressure maintained on amlodipine 5 mg and hydrochlorothiazide 25 mg daily close to blood pressure goal of less than 130/80.  Explained that having normal blood pressure is the goal and medications are helping to get to goal and maintain normal blood pressure. DIET: Limit salt intake, read nutrition labels to check salt content, limit fried and high fatty foods  Avoid using multisymptom OTC cold preparations that generally contain sudafed which can rise BP. Consult with pharmacist on best cold relief products to use for persons with HTN EXERCISE Discussed incorporating exercise such as walking - 30 minutes most days of the week and can do in 10 minute intervals     Hospital discharge follow-up ED follow-up for back pain patient needs to schedule appointment with neurosurgery and spine for evaluation  Chronic bilateral low back pain with sciatica, sciatica laterality unspecified Pain managed by pain clinic no medications will be dispensed from this clinic      Patient have been counseled extensively about nutrition and exercise. Other issues discussed during this visit include: low cholesterol diet, weight control and daily exercise, foot care, annual eye examinations at Ophthalmology, importance of adherence with  medications and regular follow-up. We also discussed long term complications of uncontrolled diabetes and hypertension.   No follow-ups on file.  The patient was given clear instructions to go to ER or return to medical center if symptoms don't improve, worsen or new problems develop. The patient verbalized understanding. The patient was told to call to get lab results if they haven't heard anything in the next week.   This note has been created with Surveyor, quantity. Any transcriptional errors are unintentional.   Kerin Perna, NP 04/12/2022, 9:38 PM

## 2022-04-11 NOTE — Telephone Encounter (Signed)
  Chief Complaint: low back pain , dark colored urine Symptoms: low back pain, shoots down legs, dark colored urine noted. C/o lidocaine patches for pain falling off. Requesting additional pain patches or medication for pain. At work lifts heavy food trays  Frequency: last Saturday  Pertinent Negatives: Patient denies chest pain no fever. Disposition: '[]'$ ED /'[]'$ Urgent Care (no appt availability in office) / '[x]'$ Appointment(In office/virtual)/ '[]'$  [place] [facility]/ '[]'$ Home Care/ '[]'$ Refused Recommended Disposition /'[]'$ [place] [facility]/ '[]'$  Follow-up with PCP Additional Notes:   Appt scheduled for today  due to dark colored urine. Hosp. F/u scheduled for 04/18/22. Please advise if today visit can include hosp f/u. Patient would like to go back to work tomorrow and requesting order for MRI. Has been seen in ED 04/05/22.     Reason for Disposition  Blood in urine (red, pink, or tea-colored)  Answer Assessment - Initial Assessment Questions 1. ONSET: "When did the pain begin?"      Last Saturday  2. LOCATION: "Where does it hurt?" (upper, mid or lower back)     Low back  3. SEVERITY: "How bad is the pain?"  (e.g., Scale 1-10; mild, moderate, or severe)   - MILD (1-3): Doesn't interfere with normal activities.    - MODERATE (4-7): Interferes with normal activities or awakens from sleep.    - SEVERE (8-10): Excruciating pain, unable to do any normal activities.      Moderate no sleeping  4. PATTERN: "Is the pain constant?" (e.g., yes, no; constant, intermittent)      intermittent 5. RADIATION: "Does the pain shoot into your legs or somewhere else?"     Yes  6. CAUSE:  "What do you think is causing the back pain?"      Lifting heavy food at work  7. BACK OVERUSE:  "Any recent lifting of heavy objects, strenuous work or exercise?"     Lift heavy food pans for work  8. MEDICINES: "What have you taken so far for the pain?" (e.g., nothing, acetaminophen, NSAIDS)     Oxycodone , lidocaine  pain patches 9. NEUROLOGIC SYMPTOMS: "Do you have any weakness, numbness, or problems with bowel/bladder control?"     Dark colored urine 10. OTHER SYMPTOMS: "Do you have any other symptoms?" (e.g., fever, abdomen pain, burning with urination, blood in urine)       Urine dark  11. PREGNANCY: "Is there any chance you are pregnant?" "When was your last menstrual period?"       na  Protocols used: Back Pain-A-AH

## 2022-04-12 ENCOUNTER — Encounter (INDEPENDENT_AMBULATORY_CARE_PROVIDER_SITE_OTHER): Payer: Self-pay | Admitting: Primary Care

## 2022-04-18 ENCOUNTER — Inpatient Hospital Stay (INDEPENDENT_AMBULATORY_CARE_PROVIDER_SITE_OTHER): Payer: Medicaid Other | Admitting: Primary Care

## 2022-04-20 ENCOUNTER — Other Ambulatory Visit (INDEPENDENT_AMBULATORY_CARE_PROVIDER_SITE_OTHER): Payer: Self-pay | Admitting: Primary Care

## 2022-04-20 NOTE — Telephone Encounter (Signed)
Requested Prescriptions  Pending Prescriptions Disp Refills  . fluticasone (FLONASE) 50 MCG/ACT nasal spray [Pharmacy Med Name: FLUTICASONE PROPIONATE 50 MCG/ACT NASAL SUSPENSION] 16 g 1    Sig: PLACE 2 SPRAYS INTO BOTH NOSTRILS DAILY.     Ear, Nose, and Throat: Nasal Preparations - Corticosteroids Passed - 04/20/2022  9:44 AM      Passed - Valid encounter within last 12 months    Recent Outpatient Visits          1 week ago Essential hypertension   [place] Kerin Perna, NP   2 months ago Essential hypertension   [place] Kerin Perna, NP   4 months ago Exposure to sexually transmitted disease (STD)   [place] Kerin Perna, NP   5 months ago Vaginal itching   [place] Kerin Perna, NP   5 years ago Lumbar degenerative disc disease   [place], Roger David, PA-C      Future Appointments            In 4 weeks Kerin Perna, NP [place]           . atorvastatin (LIPITOR) 20 MG tablet [Pharmacy Med Name: ATORVASTATIN CALCIUM 20 MG ORAL TABLET] 90 tablet 1    Sig: TAKE ONE TABLET BY MOUTH ONCE DAILY FOR CHOLESTEROL     Cardiovascular:  Antilipid - Statins Failed - 04/20/2022  9:44 AM      Failed - Lipid Panel in normal range within the last 12 months    Cholesterol, Total  Date Value Ref Range Status  10/28/2021 250 (H) 100 - 199 mg/dL Final   LDL Chol Calc (NIH)  Date Value Ref Range Status  10/28/2021 108 (H) 0 - 99 mg/dL Final   HDL  Date Value Ref Range Status  10/28/2021 119 >39 mg/dL Final   Triglycerides  Date Value Ref Range Status  10/28/2021 138 0 - 149 mg/dL Final         Passed - Patient is not pregnant      Passed - Valid encounter within last 12 months    Recent Outpatient Visits          1 week ago Essential hypertension   [place], Michelle P, NP   2 months ago Essential hypertension   [place], Michelle P, NP   4 months ago Exposure to sexually transmitted disease (STD)   [place] Kerin Perna, NP   5 months ago Vaginal itching   [place] Kerin Perna, NP   5 years ago Lumbar degenerative disc disease   [place] Clent Demark, PA-C      Future Appointments            In 4 weeks Kerin Perna, NP [place]

## 2022-05-06 ENCOUNTER — Other Ambulatory Visit (INDEPENDENT_AMBULATORY_CARE_PROVIDER_SITE_OTHER): Payer: Self-pay | Admitting: Primary Care

## 2022-05-06 DIAGNOSIS — R21 Rash and other nonspecific skin eruption: Secondary | ICD-10-CM

## 2022-05-06 DIAGNOSIS — J45909 Unspecified asthma, uncomplicated: Secondary | ICD-10-CM

## 2022-05-09 ENCOUNTER — Ambulatory Visit
Admission: RE | Admit: 2022-05-09 | Discharge: 2022-05-09 | Disposition: A | Discharge status: Home / Self Care | Payer: Medicaid Other | Source: Ambulatory Visit | Attending: Primary Care | Admitting: Primary Care

## 2022-05-09 DIAGNOSIS — Z1231 Encounter for screening mammogram for malignant neoplasm of breast: Secondary | ICD-10-CM

## 2022-05-09 NOTE — Telephone Encounter (Signed)
Requested medication (s) are due for refill today:yes  Requested medication (s) are on the active medication list: yes    Last refill: 02/11/22  80g  1 refill  Future visit scheduled yes 05/18/22  Notes to clinic:Not delegated, please review. Thank you.  Requested Prescriptions  Pending Prescriptions Disp Refills   triamcinolone cream (KENALOG) 0.1 % [Pharmacy Med Name: TRIAMCINOLONE ACETONIDE 0.1 % EXTERNAL CREAM] 80 g 1    Sig: Apply 1 Application topically 2 (two) times daily.     Not Delegated - Dermatology:  Corticosteroids Failed - 05/06/2022  4:49 PM      Failed - This refill cannot be delegated      Passed - Valid encounter within last 12 months    Recent Outpatient Visits           4 weeks ago Essential hypertension   [place], [place], NP   2 months ago Essential hypertension   [place] Kerin Perna, NP   5 months ago Exposure to sexually transmitted disease (STD)   [place] Kerin Perna, NP   6 months ago Vaginal itching   [place], Michelle P, NP   5 years ago Lumbar degenerative disc disease   [place] Clent Demark, PA-C       Future Appointments             In 1 week Kerin Perna, NP [facility]

## 2022-05-09 NOTE — Telephone Encounter (Signed)
Will forward to provider  

## 2022-05-13 ENCOUNTER — Ambulatory Visit (INDEPENDENT_AMBULATORY_CARE_PROVIDER_SITE_OTHER): Payer: Medicaid Other | Admitting: Primary Care

## 2022-05-16 ENCOUNTER — Other Ambulatory Visit: Payer: Self-pay | Admitting: Primary Care

## 2022-05-16 DIAGNOSIS — R928 Other abnormal and inconclusive findings on diagnostic imaging of breast: Secondary | ICD-10-CM

## 2022-05-18 ENCOUNTER — Ambulatory Visit (INDEPENDENT_AMBULATORY_CARE_PROVIDER_SITE_OTHER): Payer: Medicaid Other | Admitting: Primary Care

## 2022-05-18 ENCOUNTER — Encounter (INDEPENDENT_AMBULATORY_CARE_PROVIDER_SITE_OTHER): Payer: Self-pay | Admitting: Primary Care

## 2022-05-18 VITALS — BP 139/81 | HR 102 | Resp 16 | Wt 202.2 lb

## 2022-05-18 DIAGNOSIS — R21 Rash and other nonspecific skin eruption: Secondary | ICD-10-CM | POA: Diagnosis not present

## 2022-05-18 MED ORDER — TRIAMCINOLONE ACETONIDE 0.1 % EX CREA: 1.0000 | TOPICAL_CREAM | Freq: Two times a day (BID) | CUTANEOUS | 1 refills | Status: DC

## 2022-05-18 MED ORDER — TRIAMCINOLONE ACETONIDE 0.1 % EX OINT: TOPICAL_OINTMENT | Freq: Three times a day (TID) | CUTANEOUS | 3 refills | Status: DC

## 2022-05-18 MED ORDER — TRIAMCINOLONE ACETONIDE 0.1 % EX CREA: 1.0000 | TOPICAL_CREAM | Freq: Three times a day (TID) | CUTANEOUS | 1 refills | Status: DC

## 2022-05-18 MED ORDER — DOXYCYCLINE HYCLATE 100 MG PO TABS: 100.0000 mg | ORAL_TABLET | Freq: Two times a day (BID) | ORAL | 0 refills | Status: DC

## 2022-05-18 NOTE — Progress Notes (Signed)
[place], is a 56 y.o. female  AYT:[id number]  [id number]:[id number]  DOB - 12-23-1965  No chief complaint on file.      Subjective:   Heather Perry is a 56 y.o. female here today for rash under her breasts, and groin causing discomfort and itchiness.  Patient has No headache, No chest pain, No abdominal pain - No Nausea, No new weakness tingling or numbness, No Cough - shortness of breath  No problems updated.  Allergies  Allergen Reactions   Hydrocodone-Acetaminophen Itching    Started itching.  Pt states that she can take this medication Other reaction(s): Other (See Comments) Pruritus   Lortab [Hydrocodone-Acetaminophen]     Started itching   Niacin Hives   Penicillin G Hives and Nausea And Vomiting    Did it involve swelling of the face/tongue/throat, SOB, or low BP? No Did it involve sudden or severe rash/hives, skin peeling, or any reaction on the inside of your mouth or nose? Yes Did you need to seek medical attention at a hospital or doctor's office? No When did it last happen?    Over 20 years Ago   If all above answers are "NO", may proceed with cephalosporin use.     Penicillins Itching    Past Medical History:  Diagnosis Date   Allergy    Anxiety    Arthritis    Asthma    Blood transfusion without reported diagnosis    Depression    GERD (gastroesophageal reflux disease)    High cholesterol    Hypertension    Obesity    Osteoporosis    Recurrent boils    Seizures ([place])    last seizure "3 years ago" per pt 06-16-16 KBW   Sleep apnea    no CPAP used   Uterine fibroid     Current Outpatient Medications on File Prior to Visit  Medication Sig Dispense Refill   AMITIZA 24 MCG capsule TAKE 1 CAPSULE (24 MCG TOTAL) BY MOUTH 2 (TWO) TIMES DAILY WITH A MEAL. 60 capsule 1   amLODipine (NORVASC) 5 MG tablet Take 1 tablet (5 mg total) by mouth daily. 90 tablet 1   aspirin EC 81 MG tablet Take 81 mg by mouth daily.      atorvastatin (LIPITOR) 20 MG tablet TAKE ONE TABLET BY MOUTH ONCE DAILY FOR CHOLESTEROL 90 tablet 1   budesonide-formoterol (SYMBICORT) 160-4.5 MCG/ACT inhaler Inhale 2 puffs into the lungs 2 (two) times daily. (Patient taking differently: Inhale 2 puffs into the lungs 2 (two) times daily as needed (wheezing, sob).) 1 Inhaler 1   celecoxib (CELEBREX) 100 MG capsule Take 1 capsule (100 mg total) by mouth 2 (two) times daily. 180 capsule 1   clonazePAM (KLONOPIN) 1 MG tablet Take 0.5 mg by mouth 2 (two) times daily as needed for anxiety.     diclofenac Sodium (VOLTAREN) 1 % GEL APPLY 2 GRAMS TOPICALLY 4 (FOUR) TIMES DAILY. 500 g 0   esomeprazole (NEXIUM) 40 MG capsule TAKE ONE CAPSULE BY MOUTH ONCE DAILY AS NEEDED FOR INDIGESTION 30 capsule 1   fluticasone (FLONASE) 50 MCG/ACT nasal spray PLACE 2 SPRAYS INTO BOTH NOSTRILS DAILY. 16 g 1   gabapentin (NEURONTIN) 300 MG capsule Take 300 mg by mouth 3 (three) times daily.     hydrochlorothiazide (HYDRODIURIL) 25 MG tablet Take 1 tablet (25 mg total) by mouth daily. 90 tablet 3   hydroxychloroquine (PLAQUENIL) 200 MG tablet Take 400 mg by mouth daily.  hydroxypropyl methylcellulose / hypromellose (ISOPTO TEARS / GONIOVISC) 2.5 % ophthalmic solution Place 1 drop into both eyes 3 (three) times daily as needed for dry eyes. 15 mL 3   ipratropium-albuterol (DUONEB) 0.5-2.5 (3) MG/3ML SOLN Take 3 mLs by nebulization every 6 (six) hours as needed. (Patient taking differently: Take 3 mLs by nebulization every 6 (six) hours as needed (for SOB).) 360 mL 1   leflunomide (ARAVA) 20 MG tablet Take 20 mg by mouth daily.     lidocaine (LIDODERM) 5 % Place 1 patch onto the skin daily. Remove & Discard patch within 12 hours or as directed by MD 30 patch 0   methylPREDNISolone (MEDROL DOSEPAK) 4 MG TBPK tablet Follow instructions on package 21 each 0   Misc. Devices (WALKER) MISC 1 Device by Does not apply route daily as needed. 1 each 0   ondansetron (ZOFRAN-ODT) 4 MG  disintegrating tablet Take 1 tablet (4 mg total) by mouth every 8 (eight) hours as needed for nausea or vomiting. 10 tablet 0   oxyCODONE (ROXICODONE) 15 MG immediate release tablet Take 15 mg by mouth 4 (four) times daily as needed for pain.     phenytoin (DILANTIN) 100 MG ER capsule Take 100 mg by mouth 3 (three) times daily.     traZODone (DESYREL) 50 MG tablet TAKE 1/2 TO 1 TABLET BY MOUTH AT BEDTIME AS NEEDED FOR SLEEP. 30 tablet 3   VENTOLIN HFA 108 (90 Base) MCG/ACT inhaler INHALE 2 PUFFS BY MOUTH EVERY 6 HOURS AS NEEDED FOR SHORTNESS OF BREATH OR WHEEZING. 18 g 1   No current facility-administered medications on file prior to visit.    Objective:  Blood Pressure 139/81   Pulse (Abnormal) 102   Respiration 16   Weight 202 lb 3.2 oz (91.7 kg)   Oxygen Saturation 99%   Body Mass Index 36.98 kg/m    Exam General appearance : Awake, alert, not in any distress. Speech Clear. Not toxic looking HEENT: Atraumatic and Normocephalic, pupils equally reactive to light and accomodation Neck: Supple, no JVD. No cervical lymphadenopathy.  Chest: Good air entry bilaterally, no added sounds  CVS: S1 S2 regular, no murmurs.  Abdomen: Bowel sounds present, Non tender and not distended with no gaurding, rigidity or rebound. Extremities: B/L Lower Ext shows no edema, both legs are warm to touch Neurology: Awake alert, and oriented X 3, Non focal Skin:  Rash  Data Review Lab Results  Component Value Date   HGBA1C 5.1 10/28/2021   HGBA1C 5.3 04/25/2016    Assessment & Plan  Diagnoses and all orders for this visit:  Rash and nonspecific skin eruption     -     Ambulatory referral to Dermatology -     triamcinolone cream (KENALOG) 0.1 %; Apply 1 Application topically 3 (three) times daily.  Other orders -     triamcinolone 0.1% oint-Eucerin equivalent cream 1:1 mixture; Apply topically 3 (three) times daily. -     doxycycline (VIBRA-TABS) 100 MG tablet; Take 1 tablet (100 mg total) by  mouth 2 (two) times daily.    Patient have been counseled extensively about nutrition and exercise. Other issues discussed during this visit include: low cholesterol diet, weight control and daily exercise, foot care, annual eye examinations at Ophthalmology, importance of adherence with medications and regular follow-up. We also discussed long term complications of uncontrolled diabetes and hypertension.   Return in about 3 months (around 08/18/2022) for HTN/Fasting.  The patient was given clear instructions to go to  ER or return to medical center if symptoms don't improve, worsen or new problems develop. The patient verbalized understanding. The patient was told to call to get lab results if they haven't heard anything in the next week.   This note has been created with Surveyor, quantity. Any transcriptional errors are unintentional.   Kerin Perna, NP 05/18/2022, 1:49 PM

## 2022-05-19 ENCOUNTER — Other Ambulatory Visit (INDEPENDENT_AMBULATORY_CARE_PROVIDER_SITE_OTHER): Payer: Self-pay | Admitting: Primary Care

## 2022-05-19 DIAGNOSIS — I10 Essential (primary) hypertension: Secondary | ICD-10-CM

## 2022-05-19 DIAGNOSIS — K2100 Gastro-esophageal reflux disease with esophagitis, without bleeding: Secondary | ICD-10-CM

## 2022-05-19 DIAGNOSIS — Z760 Encounter for issue of repeat prescription: Secondary | ICD-10-CM

## 2022-05-19 NOTE — Telephone Encounter (Signed)
Requested medication (s) are due for refill today:routing for review  Requested medication (s) are on the active medication list: yes  Last refill:  01/19/22  Future visit scheduled: yes  Notes to clinic:  Unable to refill per protocol, cannot delegate.      Requested Prescriptions  Pending Prescriptions Disp Refills   phenytoin (DILANTIN) 100 MG ER capsule      Sig: Take 1 capsule (100 mg total) by mouth 3 (three) times daily.     Not Delegated - Neurology:  Anticonvulsants - phenytoin Failed - 05/19/2022 10:11 AM      Failed - This refill cannot be delegated      Failed - PLT in normal range and within 360 days    Platelets  Date Value Ref Range Status  04/05/2022 141 (L) 150 - 400 K/uL Final  01/11/2017 173 150 - 379 x10E3/uL Final         Failed - Phenytoin (serum) in normal range and within 360 days    No results found for: "PHENYTOIN", "PHENYTFREE", "PHENYTOINBD", "PHENPERCFREE"       Passed - ALT in normal range and within 360 days    ALT  Date Value Ref Range Status  04/05/2022 30 0 - 44 U/L Final         Passed - AST in normal range and within 360 days    AST  Date Value Ref Range Status  04/05/2022 38 15 - 41 U/L Final         Passed - HGB in normal range and within 360 days    Hemoglobin  Date Value Ref Range Status  04/05/2022 13.2 12.0 - 15.0 g/dL Final  01/11/2017 12.8 11.1 - 15.9 g/dL Final         Passed - HCT in normal range and within 360 days    HCT  Date Value Ref Range Status  04/05/2022 39.8 36.0 - 46.0 % Final   Hematocrit  Date Value Ref Range Status  01/11/2017 38.4 34.0 - 46.6 % Final         Passed - WBC in normal range and within 360 days    WBC  Date Value Ref Range Status  04/05/2022 4.4 4.0 - 10.5 K/uL Final         Passed - Completed PHQ-2 or PHQ-9 in the last 360 days      Passed - Patient is not pregnant      Passed - Valid encounter within last 12 months    Recent Outpatient Visits           Yesterday Rash and  nonspecific skin eruption   [place], Michelle P, NP   1 month ago Essential hypertension   [place], Michelle P, NP   3 months ago Essential hypertension   [place], Michelle P, NP   5 months ago Exposure to sexually transmitted disease (STD)   [place], Michelle P, NP   6 months ago Vaginal itching   [place], Michelle P, NP       Future Appointments             In 3 months Oletta Lamas, Milford Cage, NP [place]

## 2022-05-19 NOTE — Telephone Encounter (Signed)
Requested Prescriptions  Pending Prescriptions Disp Refills   esomeprazole (NEXIUM) 40 MG capsule [Pharmacy Med Name: ESOMEPRAZOLE MAGNESIUM 40 MG ORAL CAPSULE DELAYED RELEASE] 30 capsule 1    Sig: TAKE ONE CAPSULE BY MOUTH ONCE DAILY AS NEEDED FOR INDIGESTION     Gastroenterology: Proton Pump Inhibitors 2 Passed - 05/19/2022  2:53 PM      Passed - ALT in normal range and within 360 days    ALT  Date Value Ref Range Status  04/05/2022 30 0 - 44 U/L Final         Passed - AST in normal range and within 360 days    AST  Date Value Ref Range Status  04/05/2022 38 15 - 41 U/L Final         Passed - Valid encounter within last 12 months    Recent Outpatient Visits           Yesterday Rash and nonspecific skin eruption   [place], Michelle P, NP   1 month ago Essential hypertension   [place], Michelle P, NP   3 months ago Essential hypertension   [place], Michelle P, NP   5 months ago Exposure to sexually transmitted disease (STD)   [place] Kerin Perna, NP   6 months ago Vaginal itching   [place] Kerin Perna, NP       Future Appointments             In 3 months [place], Milford Cage, NP [facility]             amLODipine (NORVASC) 5 MG tablet [Pharmacy Med Name: AMLODIPINE BESYLATE 5 MG ORAL TABLET] 90 tablet 1    Sig: TAKE 1 TABLET (5 MG TOTAL) BY MOUTH DAILY.     Cardiovascular: Calcium Channel Blockers 2 Passed - 05/19/2022  2:53 PM      Passed - Last BP in normal range    BP Readings from Last 1 Encounters:  05/18/22 139/81         Passed - Last Heart Rate in normal range    Pulse Readings from Last 1 Encounters:  05/18/22 (!) 102         Passed - Valid encounter within last 6 months    Recent Outpatient Visits           Yesterday Rash and nonspecific skin  eruption   [place], Michelle P, NP   1 month ago Essential hypertension   [place], Michelle P, NP   3 months ago Essential hypertension   [place], Michelle P, NP   5 months ago Exposure to sexually transmitted disease (STD)   [place] Kerin Perna, NP   6 months ago Vaginal itching   [place] Kerin Perna, NP       Future Appointments             In 3 months [place], Milford Cage, NP [facility]             cyclobenzaprine (FLEXERIL) 10 MG tablet [Pharmacy Med Name: CYCLOBENZAPRINE HCL 10 MG ORAL TABLET] 90 tablet 1    Sig: TAKE 1 TABLET (10 MG TOTAL) BY MOUTH 3 (THREE) TIMES DAILY AS NEEDED FOR  MUSCLE SPASMS.     Not Delegated - Analgesics:  Muscle Relaxants Failed - 05/19/2022  2:53 PM      Failed - This refill cannot be delegated      Passed - Valid encounter within last 6 months    Recent Outpatient Visits           Yesterday Rash and nonspecific skin eruption   [place], Michelle P, NP   1 month ago Essential hypertension   [place], Michelle P, NP   3 months ago Essential hypertension   [place], Michelle P, NP   5 months ago Exposure to sexually transmitted disease (STD)   [place] Kerin Perna, NP   6 months ago Vaginal itching   [place], NP       Future Appointments             In 3 months Oletta Lamas, Milford Cage, NP [place]

## 2022-05-19 NOTE — Telephone Encounter (Signed)
Medication Refill - Medication: phenytoin (DILANTIN) 100 MG ER capsule   Has the patient contacted their pharmacy? Yes.   (Agent: If no, request that the patient contact the pharmacy for the refill. If patient does not wish to contact the pharmacy document the reason why and proceed with request.) (Agent: If yes, when and what did the pharmacy advise?)  Preferred Pharmacy (with phone number or street name):  [place], [place] - [address]  [place] [address]  Phone: [phone] Fax: [phone]   Has the patient been seen for an appointment in the last year OR does the patient have an upcoming appointment? Yes.    Agent: Please be advised that RX refills may take up to 3 business days. We ask that you follow-up with your pharmacy.

## 2022-05-19 NOTE — Telephone Encounter (Signed)
Requested medication (s) are due for refill today - no  Requested medication (s) are on the active medication list -no  Future visit scheduled -yes  Last refill:  no longer on current medication list  Notes to clinic: non delegated Rx  Requested Prescriptions  Pending Prescriptions Disp Refills   cyclobenzaprine (FLEXERIL) 10 MG tablet [Pharmacy Med Name: CYCLOBENZAPRINE HCL 10 MG ORAL TABLET] 90 tablet 1    Sig: TAKE 1 TABLET (10 MG TOTAL) BY MOUTH 3 (THREE) TIMES DAILY AS NEEDED FOR MUSCLE SPASMS.     Not Delegated - Analgesics:  Muscle Relaxants Failed - 05/19/2022  2:53 PM      Failed - This refill cannot be delegated      Passed - Valid encounter within last 6 months    Recent Outpatient Visits           Yesterday Rash and nonspecific skin eruption   [place], Michelle P, NP   1 month ago Essential hypertension   [place], Michelle P, NP   3 months ago Essential hypertension   [place], Michelle P, NP   5 months ago Exposure to sexually transmitted disease (STD)   [place] Kerin Perna, NP   6 months ago Vaginal itching   [place] Kerin Perna, NP       Future Appointments             In 3 months [place], Milford Cage, NP [facility]            Signed Prescriptions Disp Refills   esomeprazole (NEXIUM) 40 MG capsule 30 capsule 1    Sig: TAKE ONE CAPSULE BY MOUTH ONCE DAILY AS NEEDED FOR INDIGESTION     Gastroenterology: Proton Pump Inhibitors 2 Passed - 05/19/2022  2:53 PM      Passed - ALT in normal range and within 360 days    ALT  Date Value Ref Range Status  04/05/2022 30 0 - 44 U/L Final         Passed - AST in normal range and within 360 days    AST  Date Value Ref Range Status  04/05/2022 38 15 - 41 U/L Final         Passed - Valid encounter within last 12  months    Recent Outpatient Visits           Yesterday Rash and nonspecific skin eruption   [place], Michelle P, NP   1 month ago Essential hypertension   [place], Michelle P, NP   3 months ago Essential hypertension   [place], Michelle P, NP   5 months ago Exposure to sexually transmitted disease (STD)   [place] Kerin Perna, NP   6 months ago Vaginal itching   [place] [facility] Kerin Perna, NP       Future Appointments             In 3 months [place], Milford Cage, NP [facility]             amLODipine (NORVASC) 5 MG tablet 90 tablet 0    Sig: TAKE 1 TABLET (5 MG TOTAL) BY MOUTH DAILY.     Cardiovascular: Calcium Channel Blockers 2 Passed -  05/19/2022  2:53 PM      Passed - Last BP in normal range    BP Readings from Last 1 Encounters:  05/18/22 139/81         Passed - Last Heart Rate in normal range    Pulse Readings from Last 1 Encounters:  05/18/22 (!) 102         Passed - Valid encounter within last 6 months    Recent Outpatient Visits           Yesterday Rash and nonspecific skin eruption   [place], Michelle P, NP   1 month ago Essential hypertension   [place], Michelle P, NP   3 months ago Essential hypertension   [place] Kerin Perna, NP   5 months ago Exposure to sexually transmitted disease (STD)   [place] Kerin Perna, NP   6 months ago Vaginal itching   [place] Kerin Perna, NP       Future Appointments             In 3 months Oletta Lamas, Milford Cage, NP [place]               Requested Prescriptions  Pending Prescriptions Disp Refills   cyclobenzaprine  (FLEXERIL) 10 MG tablet [Pharmacy Med Name: CYCLOBENZAPRINE HCL 10 MG ORAL TABLET] 90 tablet 1    Sig: TAKE 1 TABLET (10 MG TOTAL) BY MOUTH 3 (THREE) TIMES DAILY AS NEEDED FOR MUSCLE SPASMS.     Not Delegated - Analgesics:  Muscle Relaxants Failed - 05/19/2022  2:53 PM      Failed - This refill cannot be delegated      Passed - Valid encounter within last 6 months    Recent Outpatient Visits           Yesterday Rash and nonspecific skin eruption   [place], Michelle P, NP   1 month ago Essential hypertension   [place], Michelle P, NP   3 months ago Essential hypertension   [place], Michelle P, NP   5 months ago Exposure to sexually transmitted disease (STD)   [place] Kerin Perna, NP   6 months ago Vaginal itching   [facility] Kerin Perna, NP       Future Appointments             In 3 months [place], Milford Cage, NP [facility]            Signed Prescriptions Disp Refills   esomeprazole (NEXIUM) 40 MG capsule 30 capsule 1    Sig: TAKE ONE CAPSULE BY MOUTH ONCE DAILY AS NEEDED FOR INDIGESTION     Gastroenterology: Proton Pump Inhibitors 2 Passed - 05/19/2022  2:53 PM      Passed - ALT in normal range and within 360 days    ALT  Date Value Ref Range Status  04/05/2022 30 0 - 44 U/L Final         Passed - AST in normal range and within 360 days    AST  Date Value Ref Range Status  04/05/2022 38 15 - 41 U/L Final         Passed - Valid encounter within last 12 months  Recent Outpatient Visits           Yesterday Rash and nonspecific skin eruption   [place] [facility] Kerin Perna, NP   1 month ago Essential hypertension   [place] Kerin Perna, NP   3 months ago Essential hypertension   [place] Kerin Perna, NP   5 months ago Exposure to sexually transmitted disease (STD)   [place] Kerin Perna, NP   6 months ago Vaginal itching   [facility] Kerin Perna, NP       Future Appointments             In 3 months [place], Milford Cage, NP [facility]             amLODipine (NORVASC) 5 MG tablet 90 tablet 0    Sig: TAKE 1 TABLET (5 MG TOTAL) BY MOUTH DAILY.     Cardiovascular: Calcium Channel Blockers 2 Passed - 05/19/2022  2:53 PM      Passed - Last BP in normal range    BP Readings from Last 1 Encounters:  05/18/22 139/81         Passed - Last Heart Rate in normal range    Pulse Readings from Last 1 Encounters:  05/18/22 (!) 102         Passed - Valid encounter within last 6 months    Recent Outpatient Visits           Yesterday Rash and nonspecific skin eruption   [place], Michelle P, NP   1 month ago Essential hypertension   [place], Michelle P, NP   3 months ago Essential hypertension   [place], Michelle P, NP   5 months ago Exposure to sexually transmitted disease (STD)   [place] Kerin Perna, NP   6 months ago Vaginal itching   [place] Kerin Perna, NP       Future Appointments             In 3 months Oletta Lamas, Milford Cage, NP [place]

## 2022-05-20 NOTE — Telephone Encounter (Signed)
Will forward to provider  

## 2022-05-30 ENCOUNTER — Ambulatory Visit
Admission: RE | Admit: 2022-05-30 | Discharge: 2022-05-30 | Disposition: A | Discharge status: Home / Self Care | Payer: Medicaid Other | Source: Ambulatory Visit | Attending: Primary Care | Admitting: Primary Care

## 2022-05-30 ENCOUNTER — Ambulatory Visit: Payer: Medicaid Other

## 2022-05-30 ENCOUNTER — Telehealth (INDEPENDENT_AMBULATORY_CARE_PROVIDER_SITE_OTHER): Payer: Self-pay

## 2022-05-30 DIAGNOSIS — R928 Other abnormal and inconclusive findings on diagnostic imaging of breast: Secondary | ICD-10-CM

## 2022-05-30 NOTE — Telephone Encounter (Signed)
Copied from [place] [id number]. Topic: General - Call Back - No Documentation >> May 30, 2022 10:06 AM Cyndi Bender wrote: Reason for CRM: Pt reports that she was calling back from a missed call she had. Cb# [id number]

## 2022-05-30 NOTE — Telephone Encounter (Signed)
Contacted pt this morning to go over provider message in regards to referral. Pt states she will give them a call to schedule  Pt states she has been taking the antibiotics but she is starting to have boils from scratching

## 2022-06-01 ENCOUNTER — Ambulatory Visit (INDEPENDENT_AMBULATORY_CARE_PROVIDER_SITE_OTHER): Payer: Self-pay

## 2022-06-01 NOTE — Telephone Encounter (Signed)
  Chief Complaint: cough, wheezing Symptoms: harsh cough, congestion, pt swallows phlegm, nasal congestion Frequency: 1 week Pertinent Negatives: Patient denies fever, SOB Disposition: '[]'$ ED /'[x]'$ Urgent Care (no appt availability in office) / '[]'$ Appointment(In office/virtual)/ '[]'$  [place] Virtual [facility]/ '[]'$ Home Care/ '[]'$ Refused Recommended Disposition /'[]'$ [place] [facility]/ '[]'$  Follow-up with PCP Additional Notes:  Reason for Disposition  Wheezing is present  Answer Assessment - Initial Assessment Questions 1. ONSET: "When did the cough begin?"      1 week  2. SEVERITY: "How bad is the cough today?"      sporadic 3. SPUTUM: "Describe the color of your sputum" (none, dry cough; clear, white, yellow, green)     Unknown (swallows) 4. HEMOPTYSIS: "Are you coughing up any blood?" If so ask: "How much?" (flecks, streaks, tablespoons, etc.)     unknown 5. DIFFICULTY BREATHING: "Are you having difficulty breathing?" If Yes, ask: "How bad is it?" (e.g., mild, moderate, severe)    - MILD: No SOB at rest, mild SOB with walking, speaks normally in sentences, can lie down, no retractions, pulse < 100.    - MODERATE: SOB at rest, SOB with minimal exertion and prefers to sit, cannot lie down flat, speaks in phrases, mild retractions, audible wheezing, pulse 100-120.    - SEVERE: Very SOB at rest, speaks in single words, struggling to breathe, sitting hunched forward, retractions, pulse > 120      Wheezing , moderate 6. FEVER: "Do you have a fever?" If Yes, ask: "What is your temperature, how was it measured, and when did it start?"     no 7. CARDIAC HISTORY: "Do you have any history of heart disease?" (e.g., heart attack, congestive heart failure)      no 8. LUNG HISTORY: "Do you have any history of lung disease?"  (e.g., pulmonary embolus, asthma, emphysema)     no 9. PE RISK FACTORS: "Do you have a history of blood clots?" (or: recent major surgery, recent prolonged travel, bedridden)     N/a 10.  OTHER SYMPTOMS: "Do you have any other symptoms?" (e.g., runny nose, wheezing, chest pain)       Wheezing, runny nose 11. PREGNANCY: "Is there any chance you are pregnant?" "When was your last menstrual period?"       no 12. TRAVEL: "Have you traveled out of the country in the last month?" (e.g., travel history, exposures)       N/a  Protocols used: Cough - Acute Productive-A-AH

## 2022-06-03 ENCOUNTER — Ambulatory Visit (HOSPITAL_COMMUNITY)
Admission: EM | Admit: 2022-06-03 | Discharge: 2022-06-03 | Disposition: A | Discharge status: Home / Self Care | Payer: Medicaid Other | Attending: Family Medicine | Admitting: Family Medicine

## 2022-06-03 ENCOUNTER — Ambulatory Visit (INDEPENDENT_AMBULATORY_CARE_PROVIDER_SITE_OTHER): Payer: Medicaid Other

## 2022-06-03 ENCOUNTER — Encounter (HOSPITAL_COMMUNITY): Payer: Self-pay | Admitting: *Deleted

## 2022-06-03 DIAGNOSIS — J069 Acute upper respiratory infection, unspecified: Secondary | ICD-10-CM | POA: Diagnosis present

## 2022-06-03 DIAGNOSIS — M79671 Pain in right foot: Secondary | ICD-10-CM | POA: Diagnosis not present

## 2022-06-03 DIAGNOSIS — J209 Acute bronchitis, unspecified: Secondary | ICD-10-CM | POA: Diagnosis not present

## 2022-06-03 DIAGNOSIS — M25571 Pain in right ankle and joints of right foot: Secondary | ICD-10-CM | POA: Diagnosis not present

## 2022-06-03 DIAGNOSIS — R0602 Shortness of breath: Secondary | ICD-10-CM | POA: Diagnosis not present

## 2022-06-03 DIAGNOSIS — R059 Cough, unspecified: Secondary | ICD-10-CM

## 2022-06-03 DIAGNOSIS — J4521 Mild intermittent asthma with (acute) exacerbation: Secondary | ICD-10-CM | POA: Diagnosis not present

## 2022-06-03 DIAGNOSIS — L299 Pruritus, unspecified: Secondary | ICD-10-CM | POA: Insufficient documentation

## 2022-06-03 DIAGNOSIS — Z1152 Encounter for screening for COVID-19: Secondary | ICD-10-CM | POA: Insufficient documentation

## 2022-06-03 MED ORDER — ALBUTEROL SULFATE HFA 108 (90 BASE) MCG/ACT IN AERS
INHALATION_SPRAY | RESPIRATORY_TRACT | Status: AC
  Filled 2022-06-03: qty 6.7

## 2022-06-03 MED ORDER — AZITHROMYCIN 250 MG PO TABS: 250.0000 mg | ORAL_TABLET | Freq: Every day | ORAL | 0 refills | Status: DC

## 2022-06-03 MED ORDER — BENZONATATE 100 MG PO CAPS: 100.0000 mg | ORAL_CAPSULE | Freq: Three times a day (TID) | ORAL | 0 refills | Status: DC

## 2022-06-03 MED ORDER — ALBUTEROL SULFATE (2.5 MG/3ML) 0.083% IN NEBU
2.5000 mg | INHALATION_SOLUTION | Freq: Once | RESPIRATORY_TRACT | Status: AC
  Administered 2022-06-03: 2.5 mg via RESPIRATORY_TRACT

## 2022-06-03 MED ORDER — METHYLPREDNISOLONE 4 MG PO TBPK: ORAL_TABLET | ORAL | 0 refills | Status: DC

## 2022-06-03 NOTE — ED Triage Notes (Signed)
Pt states she has cough, congestion, fatigue, and itching all over her body X 2 weeks. She has been using honey nyquil, allergy meds, duoneb breathing treatment yesterday. She was on doxy (05/18/2022) about 2 weeks ago and she states she got a rash from it and is still itching from it. She didn't complete the course.   She said she has Symbicort but is out also out of her albuterol MDI   Sx worse when she is lying, she can't sleep. Worried these sx might be coming from mold in her apartment.   She also twisted her right foot about a month ago and it still hurts so she would like an xray on it.

## 2022-06-03 NOTE — ED Provider Notes (Signed)
[place]    CSN: [id number] Arrival date & time: 06/03/22  8295      History   Chief Complaint Chief Complaint  Patient presents with   Cough   Nasal Congestion   Pruritis    HPI Heather Perry is a 56 y.o. female.   Patient is here for cough, cold symptoms.  That started about 2 weeks ago.  No fevers/chills per se.  Also with wheezing.  She does have asthma and taking her inhalers/nebulizers without much help.  She could not sleep last night due to cough.   She is requesting covid test today.  She does have mold in her house and wonders if this is related.   She is also itching for some reason. She has a rash as well.  She was given doxy several weeks ago by her pcp for another skin condition.  The itching started after the doxy.  She has not taken that for about 7 days and still with itching.   She does have a script for hydroxyzine for itching that  she used without help (was given this a while ago for another issue).  Itching is worse at night.   She injured her right foot a month ago getting out of the car and still hurting.  Having pain at the lateral aspect of the right foot at this time.        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      Home Medications    Prior to Admission medications   Medication Sig Start Date End Date Taking? Authorizing Provider  AMITIZA 24 MCG capsule TAKE 1 CAPSULE (24 MCG TOTAL) BY MOUTH 2 (TWO) TIMES DAILY WITH A MEAL. 03/11/21  Yes Raulkar, Clide Deutscher, MD  amLODipine (NORVASC) 5 MG tablet TAKE 1 TABLET (5 MG TOTAL) BY MOUTH DAILY. 05/19/22  Yes Kerin Perna, NP  aspirin EC 81 MG tablet Take 81 mg by mouth daily.   Yes [provider]  atorvastatin (LIPITOR) 20 MG tablet TAKE ONE TABLET BY MOUTH ONCE DAILY FOR CHOLESTEROL 04/20/22  Yes Kerin Perna, NP  budesonide-formoterol (SYMBICORT) 160-4.5 MCG/ACT inhaler Inhale 2 puffs into the lungs 2 (two) times daily. Patient  taking differently: Inhale 2 puffs into the lungs 2 (two) times daily as needed (wheezing, sob). 05/18/16  Yes Micheline Chapman, NP  celecoxib (CELEBREX) 100 MG capsule Take 1 capsule (100 mg total) by mouth 2 (two) times daily. 04/10/22  Yes Kerin Perna, NP  cyclobenzaprine (FLEXERIL) 10 MG tablet TAKE 1 TABLET (10 MG TOTAL) BY MOUTH 3 (THREE) TIMES DAILY AS NEEDED FOR MUSCLE SPASMS. 05/22/22  Yes Kerin Perna, NP  diclofenac Sodium (VOLTAREN) 1 % GEL APPLY 2 GRAMS TOPICALLY 4 (FOUR) TIMES DAILY. 03/11/21  Yes Raulkar, Clide Deutscher, MD  esomeprazole (NEXIUM) 40 MG capsule TAKE ONE CAPSULE BY MOUTH ONCE DAILY AS NEEDED FOR INDIGESTION 05/19/22  Yes Edwards, Michelle P, NP  fluticasone (FLONASE) 50 MCG/ACT nasal spray PLACE 2 SPRAYS INTO BOTH NOSTRILS DAILY. 04/20/22  Yes Kerin Perna, NP  gabapentin (NEURONTIN) 300 MG capsule Take 300 mg by mouth  3 (three) times daily. 12/24/21  Yes [provider]  hydroxychloroquine (PLAQUENIL) 200 MG tablet Take 400 mg by mouth daily. 03/21/17  Yes [provider]  hydroxypropyl methylcellulose / hypromellose (ISOPTO TEARS / GONIOVISC) 2.5 % ophthalmic solution Place 1 drop into both eyes 3 (three) times daily as needed for dry eyes. 02/11/22  Yes Kerin Perna, NP  ipratropium-albuterol (DUONEB) 0.5-2.5 (3) MG/3ML SOLN Take 3 mLs by nebulization every 6 (six) hours as needed. Patient taking differently: Take 3 mLs by nebulization every 6 (six) hours as needed (for SOB). 05/18/16  Yes Micheline Chapman, NP  leflunomide (ARAVA) 20 MG tablet Take 20 mg by mouth daily. 11/11/19  Yes [provider]  lidocaine (LIDODERM) 5 % Place 1 patch onto the skin daily. Remove & Discard patch within 12 hours or as directed by MD 04/05/22  Yes Gareth Morgan, MD  Misc. Devices (WALKER) MISC 1 Device by Does not apply route daily as needed. 07/03/21  Yes Hazel Sams, PA-C  oxyCODONE (ROXICODONE) 15 MG immediate release tablet Take 15 mg by mouth 4 (four) times daily as needed for pain. 12/29/21  Yes [provider]  phenytoin (DILANTIN) 100 MG ER capsule Take 100 mg by mouth 3 (three) times daily.   Yes [provider]  traZODone (DESYREL) 50 MG tablet TAKE 1/2 TO 1 TABLET BY MOUTH AT BEDTIME AS NEEDED FOR SLEEP. 03/15/22  Yes Kerin Perna, NP  triamcinolone 0.1% oint-Eucerin equivalent cream 1:1 mixture Apply topically 3 (three) times daily. 05/18/22  Yes Kerin Perna, NP  triamcinolone cream (KENALOG) 0.1 % Apply 1 Application topically 3 (three) times daily. 05/18/22  Yes Kerin Perna, NP  clonazePAM (KLONOPIN) 1 MG tablet Take 0.5 mg by mouth 2 (two) times daily as needed for anxiety. 12/15/21   [provider]  doxycycline (VIBRA-TABS) 100 MG tablet Take 1 tablet (100 mg total) by mouth 2 (two) times daily. 05/18/22   Kerin Perna, NP   hydrochlorothiazide (HYDRODIURIL) 25 MG tablet Take 1 tablet (25 mg total) by mouth daily. 10/28/21   Kerin Perna, NP  methylPREDNISolone (MEDROL DOSEPAK) 4 MG TBPK tablet Follow instructions on package 04/05/22   Gareth Morgan, MD  ondansetron (ZOFRAN-ODT) 4 MG disintegrating tablet Take 1 tablet (4 mg total) by mouth every 8 (eight) hours as needed for nausea or vomiting. 01/16/22   Barrett Henle, MD  VENTOLIN HFA 108 (90 Base) MCG/ACT inhaler INHALE 2 PUFFS BY MOUTH EVERY 6 HOURS AS NEEDED FOR SHORTNESS OF BREATH OR WHEEZING. 05/06/22   Juluis Mire  P, NP    Family History Family History  Problem Relation Age of Onset   Breast cancer Maternal Aunt        unsure how old of onset   Breast cancer Maternal Aunt        unsure how old of onset   Colon cancer Neg Hx    Esophageal cancer Neg Hx    Rectal cancer Neg Hx    Stomach cancer Neg Hx     Social History Social History   Tobacco Use   Smoking status: Never   Smokeless tobacco: Never  Vaping Use   Vaping Use: Never used  Substance Use Topics   Alcohol use: No   Drug use: No    Types: "Crack" cocaine    Comment: last smoked 2011     Allergies   Hydrocodone-acetaminophen, Lortab [hydrocodone-acetaminophen], Niacin, Penicillin g, and Penicillins   Review of Systems Review of Systems  Constitutional:  Positive for chills, fatigue and fever.  HENT:  Positive for congestion and rhinorrhea.   Respiratory:  Positive for cough, shortness of breath and wheezing.   Cardiovascular: Negative.   Gastrointestinal: Negative.   Musculoskeletal:  Positive for gait problem.  Skin:  Positive for rash.  Psychiatric/Behavioral: Negative.       Physical Exam Triage Vital Signs ED Triage Vitals  Enc Vitals Group     BP 06/03/22 0936 (!) 146/88     Pulse Rate 06/03/22 0936 100     Resp 06/03/22 0936 18     Temp 06/03/22 0936 98.1 F (36.7 C)     Temp Source 06/03/22 0936 Oral     SpO2 06/03/22 0936 94 %      Weight --      Height --      Head Circumference --      Peak Flow --      Pain Score 06/03/22 0930 5     Pain Loc --      Pain Edu? --      Excl. in [place]? --    No data found.  Updated Vital Signs BP (!) 146/88 (BP Location: Left Arm)   Pulse 100   Temp 98.1 F (36.7 C) (Oral)   Resp 18   SpO2 94%   Visual Acuity Right Eye Distance:   Left Eye Distance:   Bilateral Distance:    Right Eye Near:   Left Eye Near:    Bilateral Near:     Physical Exam Constitutional:      General: She is not in acute distress.    Appearance: Normal appearance. She is not ill-appearing or toxic-appearing.  HENT:     Nose: Congestion present.     Mouth/Throat:     Mouth: Mucous membranes are moist.     Pharynx: No oropharyngeal exudate or posterior oropharyngeal erythema.  Cardiovascular:     Rate and Rhythm: Normal rate and regular rhythm.  Pulmonary:     Effort: Pulmonary effort is normal.     Breath sounds: Wheezing present.  Musculoskeletal:     Cervical back: Normal range of motion and neck supple. No tenderness.     Comments: No swelling or deformity to the right foot/ankle;  + TTP to the lateral right foot at the 4th and 5th metatarsal area;  no TTP to the ankle;  full rom to the foot;   Lymphadenopathy:     Cervical: No cervical adenopathy.  Skin:    Findings: Rash present.     Comments: Follicular  like rash at her neck, torso, arms  Neurological:     General: No focal deficit present.     Mental Status: She is alert.  Psychiatric:        Mood and Affect: Mood normal.      UC Treatments / Results  Labs (all labs ordered are listed, but only abnormal results are displayed) Labs Reviewed  SARS CORONAVIRUS 2 (TAT 6-24 HRS)    EKG   Radiology DG Foot Complete Right  Result Date: 06/03/2022 CLINICAL DATA:  Right foot pain after injury 1 month ago. EXAM: RIGHT FOOT COMPLETE - 3+ VIEW COMPARISON:  None Available. FINDINGS: There is no evidence of fracture or  dislocation. There is no evidence of arthropathy or other focal bone abnormality. Soft tissues are unremarkable. IMPRESSION: Negative. Electronically Signed   By: Marijo Conception M.D.   On: 06/03/2022 11:01   DG Chest 2 View  Result Date: 06/03/2022 CLINICAL DATA:  Cough, shortness of breath. EXAM: CHEST - 2 VIEW COMPARISON:  July 29, 2021. FINDINGS: The heart size and mediastinal contours are within normal limits. Both lungs are clear. The visualized skeletal structures are unremarkable. IMPRESSION: No active cardiopulmonary disease. Electronically Signed   By: Marijo Conception M.D.   On: 06/03/2022 10:58    Procedures Procedures (including critical care time)  Medications Ordered in UC Medications  albuterol (PROVENTIL) (2.5 MG/3ML) 0.083% nebulizer solution 2.5 mg (2.5 mg Nebulization Given 06/03/22 1022)   S/p treatment her lungs are clear without wheezing  Initial Impression / Assessment and Plan / UC Course  I have reviewed the triage vital signs and the nursing notes.  Pertinent labs & imaging results that were available during my care of the patient were reviewed by me and considered in my medical decision making (see chart for details).   Final Clinical Impressions(s) / UC Diagnoses   Final diagnoses:  Upper respiratory tract infection, unspecified type  Pruritus  Pain in joint involving right ankle and foot  Mild intermittent asthma with acute exacerbation  Acute bronchitis, unspecified organism     Discharge Instructions      You were seen today for various issues.  Your chest xray and foot xray were normal.  I have sent out an antibiotic, cough pill, and prednisone to help with your symptoms.  If you continue to feel unwell then please follow up with your primary care provider for further care.     ED Prescriptions     Medication Sig Dispense Auth. Provider   benzonatate (TESSALON) 100 MG capsule Take 1 capsule (100 mg total) by mouth every 8 (eight) hours.  21 capsule Amram Maya, MD   azithromycin (ZITHROMAX) 250 MG tablet Take 1 tablet (250 mg total) by mouth daily. Take first 2 tablets together, then 1 every day until finished. 6 tablet Masaji Billups, Junie Panning, MD   methylPREDNISolone (MEDROL DOSEPAK) 4 MG TBPK tablet Take as direted 1 each Rondel Oh, MD      PDMP not reviewed this encounter.   Rondel Oh, MD 06/03/22 1112

## 2022-06-03 NOTE — Discharge Instructions (Addendum)
You were seen today for various issues.  Your chest xray and foot xray were normal.  I have sent out an antibiotic, cough pill, and prednisone to help with your symptoms.  Your covid swab will be resulted by later today and you will be notified if positive.  If you continue to feel unwell then please follow up with your primary care provider for further care.

## 2022-06-04 LAB — SARS CORONAVIRUS 2 (TAT 6-24 HRS): SARS Coronavirus 2: NEGATIVE

## 2022-06-10 ENCOUNTER — Other Ambulatory Visit: Payer: Medicaid Other

## 2022-06-16 ENCOUNTER — Other Ambulatory Visit (INDEPENDENT_AMBULATORY_CARE_PROVIDER_SITE_OTHER): Payer: Self-pay | Admitting: Primary Care

## 2022-06-18 ENCOUNTER — Emergency Department (HOSPITAL_COMMUNITY)
Admission: EM | Admit: 2022-06-18 | Discharge: 2022-06-18 | Disposition: A | Discharge status: Home / Self Care | Payer: Medicaid Other | Attending: Emergency Medicine | Admitting: Emergency Medicine

## 2022-06-18 DIAGNOSIS — I10 Essential (primary) hypertension: Secondary | ICD-10-CM | POA: Insufficient documentation

## 2022-06-18 DIAGNOSIS — Z7982 Long term (current) use of aspirin: Secondary | ICD-10-CM | POA: Diagnosis not present

## 2022-06-18 DIAGNOSIS — Z79899 Other long term (current) drug therapy: Secondary | ICD-10-CM | POA: Diagnosis not present

## 2022-06-18 DIAGNOSIS — R21 Rash and other nonspecific skin eruption: Secondary | ICD-10-CM | POA: Insufficient documentation

## 2022-06-18 MED ORDER — PERMETHRIN 5 % EX CREA: TOPICAL_CREAM | CUTANEOUS | 0 refills | Status: DC

## 2022-06-18 NOTE — ED Provider Notes (Signed)
[facility] EMERGENCY DEPARTMENT Provider Note   CSN: [id number] Arrival date & time: 06/18/22  0957     History  Chief Complaint  Patient presents with   Rash    Heather Perry is a 56 y.o. female.  The history is provided by the patient and medical records. No language interpreter was used.  Rash    56 year old female significant history of dermatosis papular nigra, Sjogren's syndrome, rheumatoid arthritis, hypertension hyperlipidemia presenting complaining of a rash.  Patient reports she has had an itchy rash throughout her body ongoing for the past 9 days.  Rash mildly improved when she used Kenalog cream that was prescribed to her previously.  However symptom has not fully resolved and rash comes and goes.  It is itchy and nontender.  She does not endorse any fever, throat swelling, trouble breathing or abdominal cramping.  She felt that there may have been bedbugs in her house as the room above her room has bedbugs.  She reports concern.  She denies fever neck pain no headache.  No other environmental changes.  Home Medications Prior to Admission medications   Medication Sig Start Date End Date Taking? Authorizing Provider  AMITIZA 24 MCG capsule TAKE 1 CAPSULE (24 MCG TOTAL) BY MOUTH 2 (TWO) TIMES DAILY WITH A MEAL. 03/11/21   Raulkar, Clide Deutscher, MD  amLODipine (NORVASC) 5 MG tablet TAKE 1 TABLET (5 MG TOTAL) BY MOUTH DAILY. 05/19/22   Kerin Perna, NP  aspirin EC 81 MG tablet Take 81 mg by mouth daily.    [provider]  atorvastatin (LIPITOR) 20 MG tablet TAKE ONE TABLET BY MOUTH ONCE DAILY FOR CHOLESTEROL 04/20/22   Kerin Perna, NP  azithromycin (ZITHROMAX) 250 MG tablet Take 1 tablet (250 mg total) by mouth daily. Take first 2 tablets together, then 1 every day until finished. 06/03/22   Piontek, Junie Panning, MD  benzonatate (TESSALON) 100 MG capsule Take 1 capsule (100 mg total) by mouth every 8 (eight) hours. 06/03/22   Piontek, Junie Panning, MD   budesonide-formoterol (SYMBICORT) 160-4.5 MCG/ACT inhaler Inhale 2 puffs into the lungs 2 (two) times daily. Patient taking differently: Inhale 2 puffs into the lungs 2 (two) times daily as needed (wheezing, sob). 05/18/16   Micheline Chapman, NP  celecoxib (CELEBREX) 100 MG capsule Take 1 capsule (100 mg total) by mouth 2 (two) times daily. 04/10/22   Kerin Perna, NP  clonazePAM (KLONOPIN) 1 MG tablet Take 0.5 mg by mouth 2 (two) times daily as needed for anxiety. 12/15/21   [provider]  cyclobenzaprine (FLEXERIL) 10 MG tablet TAKE 1 TABLET (10 MG TOTAL) BY MOUTH 3 (THREE) TIMES DAILY AS NEEDED FOR MUSCLE SPASMS. 05/22/22   Kerin Perna, NP  diclofenac Sodium (VOLTAREN) 1 % GEL APPLY 2 GRAMS TOPICALLY 4 (FOUR) TIMES DAILY. 03/11/21   Raulkar, Clide Deutscher, MD  esomeprazole (NEXIUM) 40 MG capsule TAKE ONE CAPSULE BY MOUTH ONCE DAILY AS NEEDED FOR INDIGESTION 05/19/22   Kerin Perna, NP  fluticasone (FLONASE) 50 MCG/ACT nasal spray PLACE 2 SPRAYS INTO BOTH NOSTRILS DAILY. 06/16/22   Kerin Perna, NP  gabapentin (NEURONTIN) 300 MG capsule Take 300 mg by mouth 3 (three) times daily. 12/24/21   [provider]  hydrochlorothiazide (HYDRODIURIL) 25 MG tablet Take 1 tablet (25 mg total) by mouth daily. 10/28/21   Kerin Perna, NP  hydroxychloroquine (PLAQUENIL) 200 MG tablet Take 400 mg by mouth daily. 03/21/17   [provider]  hydroxypropyl  methylcellulose / hypromellose (ISOPTO TEARS / GONIOVISC) 2.5 % ophthalmic solution Place 1 drop into both eyes 3 (three) times daily as needed for dry eyes. 02/11/22   Kerin Perna, NP  ipratropium-albuterol (DUONEB) 0.5-2.5 (3) MG/3ML SOLN Take 3 mLs by nebulization every 6 (six) hours as needed. Patient taking differently: Take 3 mLs by nebulization every 6 (six) hours as needed (for SOB). 05/18/16   Micheline Chapman, NP  leflunomide (ARAVA) 20 MG tablet Take 20 mg by mouth daily. 11/11/19    [provider]  lidocaine (LIDODERM) 5 % Place 1 patch onto the skin daily. Remove & Discard patch within 12 hours or as directed by MD 04/05/22   Gareth Morgan, MD  methylPREDNISolone (MEDROL DOSEPAK) 4 MG TBPK tablet Take as direted 06/03/22   Rondel Oh, MD  Misc. Devices (WALKER) MISC 1 Device by Does not apply route daily as needed. 07/03/21   Hazel Sams, PA-C  oxyCODONE (ROXICODONE) 15 MG immediate release tablet Take 15 mg by mouth 4 (four) times daily as needed for pain. 12/29/21   [provider]  phenytoin (DILANTIN) 100 MG ER capsule Take 100 mg by mouth 3 (three) times daily.    [provider]  traZODone (DESYREL) 50 MG tablet TAKE 1/2 TO 1 TABLET BY MOUTH AT BEDTIME AS NEEDED FOR SLEEP. 03/15/22   Kerin Perna, NP  triamcinolone 0.1% oint-Eucerin equivalent cream 1:1 mixture Apply topically 3 (three) times daily. 05/18/22   Kerin Perna, NP  triamcinolone cream (KENALOG) 0.1 % Apply 1 Application topically 3 (three) times daily. 05/18/22   Kerin Perna, NP  VENTOLIN HFA 108 (90 Base) MCG/ACT inhaler INHALE 2 PUFFS BY MOUTH EVERY 6 HOURS AS NEEDED FOR SHORTNESS OF BREATH OR WHEEZING. 05/06/22   Kerin Perna, NP      Allergies    Hydrocodone-acetaminophen, Lortab [hydrocodone-acetaminophen], Niacin, Penicillin g, and Penicillins    Review of Systems   Review of Systems  Skin:  Positive for rash.  All other systems reviewed and are negative.   Physical Exam Updated Vital Signs BP (!) 157/82   Pulse (!) 107   Temp 98.4 F (36.9 C)   Resp (!) 22   SpO2 98%  Physical Exam Vitals and nursing note reviewed.  Constitutional:      General: She is not in acute distress.    Appearance: She is well-developed. She is obese.  HENT:     Head: Atraumatic.  Eyes:     Conjunctiva/sclera: Conjunctivae normal.  Pulmonary:     Effort: Pulmonary effort is normal.  Musculoskeletal:     Cervical back: Neck supple.  Skin:     Findings: Rash (Urticarial rash noted throughout upper extremities and upper back.  No rash to the webspace of fingers.  Excoriation marks noted throughout.) present.  Neurological:     Mental Status: She is alert.  Psychiatric:        Mood and Affect: Mood normal.     ED Results / Procedures / Treatments   Labs (all labs ordered are listed, but only abnormal results are displayed) Labs Reviewed - No data to display  EKG None  Radiology No results found.  Procedures Procedures    Medications Ordered in ED Medications - No data to display  ED Course/ Medical Decision Making/ A&P                           Medical Decision Making  BP (!) 157/82   Pulse (!) 107   Temp 98.4 F (36.9 C)   Resp (!) 22   SpO2 98%   8:9 AM 56 year old female significant history of dermatosis papular nigra, Sjogren's syndrome, rheumatoid arthritis, hypertension hyperlipidemia presenting complaining of a rash.  Patient reports she has had an itchy rash throughout her body ongoing for the past 9 days.  Rash mildly improved when she used Kenalog cream that was prescribed to her previously.  However symptom has not fully resolved and rash comes and goes.  It is itchy and nontender.  She does not endorse any fever, throat swelling, trouble breathing or abdominal cramping.  She felt that there may have been bedbugs in her house as the room above her room has bedbugs.  She reports concern.  She denies fever neck pain no headache.  No other environmental changes.  On exam, patient has urticarial rash noted to her upper body and along both arms.  She also has multiple excoriation marks noted throughout as well.  She does not have any particular rash no pustular rash and no vesicular rash.  She does not have any signs of airway compromise, or mucosal involvement.  I suspect her rash may be due to bedbugs versus allergic reaction versus Sjrogren.  Will prescribe permethrin cream.  Patient will continue to use  Kenalog cream as well and recommend outpatient follow-up if symptoms not improving.        Final Clinical Impression(s) / ED Diagnoses Final diagnoses:  Rash and nonspecific skin eruption    Rx / DC Orders ED Discharge Orders          Ordered    permethrin (ELIMITE) 5 % cream        06/18/22 1019              Domenic Moras, PA-C 06/18/22 1020    Lajean Saver, MD 06/18/22 1500

## 2022-06-18 NOTE — ED Triage Notes (Signed)
Patient here for evaluation of a rash on bilateral arms that she noticed approximately eight days ago. Patient is alert, oriented, and in no apparent distress at this time.

## 2022-06-18 NOTE — Discharge Instructions (Signed)
Please apply permethrin cream from neck down to the rest of the body at nighttime, leave it on the skin for approximately 8 hours and wash it off.  You may repeat this process in 1 week if you notice no improvement.  Otherwise please follow-up with your primary care doctor for further care.

## 2022-06-22 ENCOUNTER — Ambulatory Visit
Admission: RE | Admit: 2022-06-22 | Discharge: 2022-06-22 | Disposition: A | Discharge status: Home / Self Care | Payer: Medicaid Other | Source: Ambulatory Visit | Attending: Primary Care | Admitting: Primary Care

## 2022-06-22 ENCOUNTER — Other Ambulatory Visit: Payer: Self-pay | Admitting: Primary Care

## 2022-06-22 DIAGNOSIS — R928 Other abnormal and inconclusive findings on diagnostic imaging of breast: Secondary | ICD-10-CM

## 2022-06-22 DIAGNOSIS — N6310 Unspecified lump in the right breast, unspecified quadrant: Secondary | ICD-10-CM

## 2022-06-22 DIAGNOSIS — R599 Enlarged lymph nodes, unspecified: Secondary | ICD-10-CM

## 2022-06-24 ENCOUNTER — Ambulatory Visit (INDEPENDENT_AMBULATORY_CARE_PROVIDER_SITE_OTHER): Payer: Self-pay

## 2022-06-24 NOTE — Telephone Encounter (Signed)
Summary: rash and acid reflux   Pt called saying she went to the ER Friday because she had a rash.  They gave her a cream but it is not helping .  She is wanting to see her but the first appt she has is 07/13/22.  She says she has acid reflux also.  CB#  [phone]      3rd attempt to contact patient on [phone] to review sx of rash and acid reflux. Rang multiple times and recording call can not be completed at this time, hang up and try call again later. Unable to leave message. Please advise.

## 2022-06-24 NOTE — Telephone Encounter (Signed)
Pt called, unable to LVM d/t message received, "call cannot be completed at this time.. "  Summary: rash and acid reflux   Pt called saying she went to the ER Friday because she had a rash.  They gave her a cream but it is not helping .  She is wanting to see her but the first appt she has is 07/13/22.  She says she has acid reflux also.  CB#  [phone]

## 2022-06-24 NOTE — Telephone Encounter (Signed)
2nd attempt, "call cannot be completed at this time... "

## 2022-06-27 NOTE — Telephone Encounter (Signed)
Spoke to patient to f/u with her for appt at [facility]. She stated she feels that issue was deriving from pest inside her home. Last night she didn't have a issue because she was able to cover up really good. She stated that someone was coming out to her home to help get rid of her problem by providing heat treatment.   Reminded of her appt on 07/13/2022 at 2:10.  States she will have transportation arranged by then.

## 2022-06-28 ENCOUNTER — Ambulatory Visit
Admission: RE | Admit: 2022-06-28 | Discharge: 2022-06-28 | Disposition: A | Discharge status: Home / Self Care | Payer: Medicaid Other | Source: Ambulatory Visit | Attending: Primary Care | Admitting: Primary Care

## 2022-06-28 ENCOUNTER — Other Ambulatory Visit: Payer: Self-pay | Admitting: Primary Care

## 2022-06-28 DIAGNOSIS — R599 Enlarged lymph nodes, unspecified: Secondary | ICD-10-CM

## 2022-06-28 DIAGNOSIS — R928 Other abnormal and inconclusive findings on diagnostic imaging of breast: Secondary | ICD-10-CM

## 2022-06-28 DIAGNOSIS — N6310 Unspecified lump in the right breast, unspecified quadrant: Secondary | ICD-10-CM

## 2022-06-28 HISTORY — PX: BREAST BIOPSY: SHX20

## 2022-07-13 ENCOUNTER — Ambulatory Visit (INDEPENDENT_AMBULATORY_CARE_PROVIDER_SITE_OTHER): Payer: Medicaid Other | Admitting: Primary Care

## 2022-07-22 ENCOUNTER — Other Ambulatory Visit (INDEPENDENT_AMBULATORY_CARE_PROVIDER_SITE_OTHER): Payer: Self-pay | Admitting: Primary Care

## 2022-07-22 DIAGNOSIS — K2100 Gastro-esophageal reflux disease with esophagitis, without bleeding: Secondary | ICD-10-CM

## 2022-07-22 DIAGNOSIS — F5101 Primary insomnia: Secondary | ICD-10-CM

## 2022-07-22 DIAGNOSIS — Z760 Encounter for issue of repeat prescription: Secondary | ICD-10-CM

## 2022-07-22 NOTE — Telephone Encounter (Signed)
Will forward to provider  

## 2022-07-31 ENCOUNTER — Emergency Department (HOSPITAL_COMMUNITY)
Admission: EM | Admit: 2022-07-31 | Discharge: 2022-07-31 | Disposition: A | Discharge status: Home / Self Care | Payer: Medicaid Other | Attending: Emergency Medicine | Admitting: Emergency Medicine

## 2022-07-31 ENCOUNTER — Emergency Department (HOSPITAL_BASED_OUTPATIENT_CLINIC_OR_DEPARTMENT_OTHER): Payer: Medicaid Other

## 2022-07-31 ENCOUNTER — Emergency Department (HOSPITAL_COMMUNITY): Payer: Medicaid Other

## 2022-07-31 ENCOUNTER — Encounter (HOSPITAL_COMMUNITY): Payer: Self-pay | Admitting: Emergency Medicine

## 2022-07-31 DIAGNOSIS — R52 Pain, unspecified: Secondary | ICD-10-CM

## 2022-07-31 DIAGNOSIS — R519 Headache, unspecified: Secondary | ICD-10-CM | POA: Diagnosis not present

## 2022-07-31 DIAGNOSIS — Z79899 Other long term (current) drug therapy: Secondary | ICD-10-CM | POA: Diagnosis not present

## 2022-07-31 DIAGNOSIS — Z7982 Long term (current) use of aspirin: Secondary | ICD-10-CM | POA: Diagnosis not present

## 2022-07-31 DIAGNOSIS — R112 Nausea with vomiting, unspecified: Secondary | ICD-10-CM | POA: Insufficient documentation

## 2022-07-31 DIAGNOSIS — J45909 Unspecified asthma, uncomplicated: Secondary | ICD-10-CM | POA: Insufficient documentation

## 2022-07-31 DIAGNOSIS — Z1152 Encounter for screening for COVID-19: Secondary | ICD-10-CM | POA: Diagnosis not present

## 2022-07-31 DIAGNOSIS — R7401 Elevation of levels of liver transaminase levels: Secondary | ICD-10-CM | POA: Diagnosis not present

## 2022-07-31 DIAGNOSIS — R1084 Generalized abdominal pain: Secondary | ICD-10-CM | POA: Insufficient documentation

## 2022-07-31 DIAGNOSIS — I10 Essential (primary) hypertension: Secondary | ICD-10-CM | POA: Insufficient documentation

## 2022-07-31 LAB — CBC WITH DIFFERENTIAL/PLATELET
Abs Immature Granulocytes: 0.02 10*3/uL (ref 0.00–0.07)
Basophils Absolute: 0 10*3/uL (ref 0.0–0.1)
Basophils Relative: 1 %
Eosinophils Absolute: 0.2 10*3/uL (ref 0.0–0.5)
Eosinophils Relative: 4 %
HCT: 35 % — ABNORMAL LOW (ref 36.0–46.0)
Hemoglobin: 12.1 g/dL (ref 12.0–15.0)
Immature Granulocytes: 1 %
Lymphocytes Relative: 42 %
Lymphs Abs: 1.8 10*3/uL (ref 0.7–4.0)
MCH: 32.7 pg (ref 26.0–34.0)
MCHC: 34.6 g/dL (ref 30.0–36.0)
MCV: 94.6 fL (ref 80.0–100.0)
Monocytes Absolute: 0.4 10*3/uL (ref 0.1–1.0)
Monocytes Relative: 10 %
Neutro Abs: 1.8 10*3/uL (ref 1.7–7.7)
Neutrophils Relative %: 42 %
Platelets: 153 10*3/uL (ref 150–400)
RBC: 3.7 MIL/uL — ABNORMAL LOW (ref 3.87–5.11)
RDW: 14.4 % (ref 11.5–15.5)
WBC: 4.2 10*3/uL (ref 4.0–10.5)
nRBC: 0 % (ref 0.0–0.2)

## 2022-07-31 LAB — COMPREHENSIVE METABOLIC PANEL
ALT: 47 U/L — ABNORMAL HIGH (ref 0–44)
AST: 52 U/L — ABNORMAL HIGH (ref 15–41)
Albumin: 3.3 g/dL — ABNORMAL LOW (ref 3.5–5.0)
Alkaline Phosphatase: 113 U/L (ref 38–126)
Anion gap: 8 (ref 5–15)
BUN: 10 mg/dL (ref 6–20)
CO2: 27 mmol/L (ref 22–32)
Calcium: 8.9 mg/dL (ref 8.9–10.3)
Chloride: 101 mmol/L (ref 98–111)
Creatinine, Ser: 0.75 mg/dL (ref 0.44–1.00)
GFR, Estimated: >60 mL/min (ref >60)
Glucose, Bld: 107 mg/dL — ABNORMAL HIGH (ref 70–99)
Potassium: 3.7 mmol/L (ref 3.5–5.1)
Sodium: 136 mmol/L (ref 135–145)
Total Bilirubin: 0.4 mg/dL (ref 0.3–1.2)
Total Protein: 6.2 g/dL — ABNORMAL LOW (ref 6.5–8.1)

## 2022-07-31 LAB — URINALYSIS, ROUTINE W REFLEX MICROSCOPIC
Bilirubin Urine: NEGATIVE
Glucose, UA: NEGATIVE mg/dL
Hgb urine dipstick: NEGATIVE
Ketones, ur: NEGATIVE mg/dL
Leukocytes,Ua: NEGATIVE
Nitrite: NEGATIVE
Protein, ur: 30 mg/dL — AB
Specific Gravity, Urine: 1.021 (ref 1.005–1.030)
pH: 5 (ref 5.0–8.0)

## 2022-07-31 LAB — RESP PANEL BY RT-PCR (RSV, FLU A&B, COVID)  RVPGX2
Influenza A by PCR: NEGATIVE
Influenza B by PCR: NEGATIVE
Resp Syncytial Virus by PCR: NEGATIVE
SARS Coronavirus 2 by RT PCR: NEGATIVE

## 2022-07-31 LAB — LIPASE, BLOOD: Lipase: 31 U/L (ref 11–51)

## 2022-07-31 MED ORDER — DICYCLOMINE HCL 20 MG PO TABS: 20.0000 mg | ORAL_TABLET | Freq: Two times a day (BID) | ORAL | 0 refills | Status: DC

## 2022-07-31 MED ORDER — SODIUM CHLORIDE 0.9 % IV BOLUS: 500.0000 mL | Freq: Once | INTRAVENOUS | Status: DC

## 2022-07-31 MED ORDER — MORPHINE SULFATE (PF) 4 MG/ML IV SOLN
4.0000 mg | Freq: Once | INTRAVENOUS | Status: AC
  Administered 2022-07-31: 4 mg via INTRAVENOUS
  Filled 2022-07-31: qty 1

## 2022-07-31 MED ORDER — SODIUM CHLORIDE 0.9 % IV BOLUS
1000.0000 mL | Freq: Once | INTRAVENOUS | Status: AC
  Administered 2022-07-31: 1000 mL via INTRAVENOUS

## 2022-07-31 MED ORDER — IOHEXOL 350 MG/ML SOLN
75.0000 mL | Freq: Once | INTRAVENOUS | Status: AC | PRN
  Administered 2022-07-31: 75 mL via INTRAVENOUS

## 2022-07-31 MED ORDER — ALUM & MAG HYDROXIDE-SIMETH 200-200-20 MG/5ML PO SUSP
30.0000 mL | Freq: Once | ORAL | Status: AC
  Administered 2022-07-31: 30 mL via ORAL
  Filled 2022-07-31: qty 30

## 2022-07-31 MED ORDER — MORPHINE SULFATE (PF) 4 MG/ML IV SOLN
4.0000 mg | Freq: Once | INTRAVENOUS | Status: DC
  Filled 2022-07-31: qty 1

## 2022-07-31 NOTE — ED Provider Notes (Signed)
  Physical Exam  BP (!) 140/74   Pulse (!) 103   Temp 98.6 F (37 C) (Oral)   Resp 13   SpO2 100%   Physical Exam Vitals and nursing note reviewed.  Constitutional:      General: She is not in acute distress.    Appearance: She is well-developed.  HENT:     Head: Normocephalic and atraumatic.  Eyes:     Conjunctiva/sclera: Conjunctivae normal.  Cardiovascular:     Rate and Rhythm: Normal rate and regular rhythm.     Heart sounds: No murmur heard. Pulmonary:     Effort: Pulmonary effort is normal. No respiratory distress.     Breath sounds: Normal breath sounds.  Abdominal:     Palpations: Abdomen is soft.     Tenderness: There is no abdominal tenderness.  Musculoskeletal:        General: No swelling.     Cervical back: Neck supple.  Skin:    General: Skin is warm and dry.     Capillary Refill: Capillary refill takes less than 2 seconds.  Neurological:     Mental Status: She is alert.  Psychiatric:        Mood and Affect: Mood normal.     Procedures  Procedures  ED Course / MDM   Clinical Course as of 07/31/22 1619  Sun Jul 31, 2022  1544 1 week of lower abdominal pain and vomiting. CT neg, vasc duplex negative. [JS]    Clinical Course User Index [JS] Donzetta Matters, MD   Medical Decision Making Reassessed patient and reviewed her labs.  Patient has slight elevation in AST and ALT.  She does endorse mild alcohol use CT scan shows no evidence of acute abdominal pathology vascular study shows no evidence of acute vascular pathology patient was tolerating p.o. intake in the emergency department tachycardia had improved patient was requesting to leave the emergency department stating that her belly pain had mostly resolved she was requesting a medication to help with pain management at home therefore she was discharged home with Bentyl.  Amount and/or Complexity of Data Reviewed Labs: ordered. Radiology: ordered.  Risk OTC drugs. Prescription drug  management.          Donzetta Matters, MD 08/01/22 0013    Drenda Freeze, MD 08/01/22 [phone]

## 2022-07-31 NOTE — Progress Notes (Signed)
VASCULAR LAB    Bilateral lower extremity venous duplex has been performed.  See CV proc for preliminary results.   Saleemah Mollenhauer, RVT 07/31/2022, 2:09 PM

## 2022-07-31 NOTE — ED Triage Notes (Signed)
Pt here from home with c/o abd pain and headache for the last week , slght nausea , pt is mostly on the right side of her abd ,

## 2022-07-31 NOTE — ED Notes (Signed)
Pt declined pain medication, reporting that she is wanting to leave soon and it did not help her pain significantly before. Maalox administered. Nicole Kindred MD notified of pt request.

## 2022-07-31 NOTE — Discharge Instructions (Addendum)
You were seen in the Emergency department today for your abdominal pain, while in the ED we completed a full history and physical. Based off our findings we feel you are suitable for discharge. Please continue to monitor your symptoms and return to the ED as soon as possible if your symptoms worsen. Sincerely, Clydie Braun, PGY-2

## 2022-07-31 NOTE — ED Provider Notes (Signed)
[facility] Provider Note   CSN: [id number] Arrival date & time: 07/31/22  0932     History  No chief complaint on file.   Heather Perry is a 57 y.o. female history of obesity, seizures, asthma, GERD, arthritis, hypertension, hyperlipidemia.  Patient presented to the ER today for evaluation of abdominal pain and headache.  Patient reports symptoms ongoing for around 1 week, she started having abdominal pain and initially she describes pain as diffuse aching and mostly in the lower abdomen it does not radiate, she reports it is moderate-severe and gradually worsening.  She reports that she has noticed a mild frontal headache over the past few days as well as some nausea.  She reports 2 episodes of nonbloody/nonbilious emesis.  She denies any associated diarrhea.  Additionally she reports a cramping sensation to her right popliteal fossa that has been intermittent problem for around 1 month.  She has a right TKA.  She denies fever, chills, chest pain/shortness of breath, cough/hemoptysis, dysuria/hematuria or any additional concerns.  HPI     Home Medications Prior to Admission medications   Medication Sig Start Date End Date Taking? Authorizing Provider  AMITIZA 24 MCG capsule TAKE 1 CAPSULE (24 MCG TOTAL) BY MOUTH 2 (TWO) TIMES DAILY WITH A MEAL. 03/11/21   Raulkar, Clide Deutscher, MD  amLODipine (NORVASC) 5 MG tablet TAKE 1 TABLET (5 MG TOTAL) BY MOUTH DAILY. 05/19/22   Kerin Perna, NP  aspirin EC 81 MG tablet Take 81 mg by mouth daily.    [provider]  atorvastatin (LIPITOR) 20 MG tablet TAKE ONE TABLET BY MOUTH ONCE DAILY FOR CHOLESTEROL 04/20/22   Kerin Perna, NP  azithromycin (ZITHROMAX) 250 MG tablet Take 1 tablet (250 mg total) by mouth daily. Take first 2 tablets together, then 1 every day until finished. 06/03/22   Piontek, Junie Panning, MD  benzonatate (TESSALON) 100 MG capsule Take 1 capsule (100 mg total) by mouth  every 8 (eight) hours. 06/03/22   Piontek, Junie Panning, MD  budesonide-formoterol (SYMBICORT) 160-4.5 MCG/ACT inhaler Inhale 2 puffs into the lungs 2 (two) times daily. Patient taking differently: Inhale 2 puffs into the lungs 2 (two) times daily as needed (wheezing, sob). 05/18/16   Micheline Chapman, NP  celecoxib (CELEBREX) 100 MG capsule Take 1 capsule (100 mg total) by mouth 2 (two) times daily. 04/10/22   Kerin Perna, NP  clonazePAM (KLONOPIN) 1 MG tablet Take 0.5 mg by mouth 2 (two) times daily as needed for anxiety. 12/15/21   [provider]  cyclobenzaprine (FLEXERIL) 10 MG tablet TAKE 1 TABLET (10 MG TOTAL) BY MOUTH 3 (THREE) TIMES DAILY AS NEEDED FOR MUSCLE SPASMS. 05/22/22   Kerin Perna, NP  diclofenac Sodium (VOLTAREN) 1 % GEL APPLY 2 GRAMS TOPICALLY 4 (FOUR) TIMES DAILY. 03/11/21   Raulkar, Clide Deutscher, MD  esomeprazole (NEXIUM) 40 MG capsule TAKE ONE CAPSULE BY MOUTH ONCE DAILY AS NEEDED FOR INDIGESTION 05/19/22   Kerin Perna, NP  fluticasone (FLONASE) 50 MCG/ACT nasal spray PLACE 2 SPRAYS INTO BOTH NOSTRILS DAILY. 06/16/22   Kerin Perna, NP  gabapentin (NEURONTIN) 300 MG capsule Take 300 mg by mouth 3 (three) times daily. 12/24/21   [provider]  hydrochlorothiazide (HYDRODIURIL) 25 MG tablet Take 1 tablet (25 mg total) by mouth daily. 10/28/21   Kerin Perna, NP  hydroxychloroquine (PLAQUENIL) 200 MG tablet Take 400 mg by mouth daily. 03/21/17   [provider]  hydroxypropyl  methylcellulose / hypromellose (ISOPTO TEARS / GONIOVISC) 2.5 % ophthalmic solution Place 1 drop into both eyes 3 (three) times daily as needed for dry eyes. 02/11/22   Kerin Perna, NP  ipratropium-albuterol (DUONEB) 0.5-2.5 (3) MG/3ML SOLN Take 3 mLs by nebulization every 6 (six) hours as needed. Patient taking differently: Take 3 mLs by nebulization every 6 (six) hours as needed (for SOB). 05/18/16   Micheline Chapman, NP  leflunomide (ARAVA) 20 MG  tablet Take 20 mg by mouth daily. 11/11/19   [provider]  lidocaine (LIDODERM) 5 % Place 1 patch onto the skin daily. Remove & Discard patch within 12 hours or as directed by MD 04/05/22   Gareth Morgan, MD  methylPREDNISolone (MEDROL DOSEPAK) 4 MG TBPK tablet Take as direted 06/03/22   Rondel Oh, MD  Misc. Devices (WALKER) MISC 1 Device by Does not apply route daily as needed. 07/03/21   Hazel Sams, PA-C  oxyCODONE (ROXICODONE) 15 MG immediate release tablet Take 15 mg by mouth 4 (four) times daily as needed for pain. 12/29/21   [provider]  permethrin (ELIMITE) 5 % cream Apply to affected area once 06/18/22   Domenic Moras, PA-C  phenytoin (DILANTIN) 100 MG ER capsule Take 100 mg by mouth 3 (three) times daily.    [provider]  traZODone (DESYREL) 50 MG tablet TAKE 1/2 TO 1 TABLET BY MOUTH AT BEDTIME AS NEEDED FOR SLEEP. 03/15/22   Kerin Perna, NP  triamcinolone 0.1% oint-Eucerin equivalent cream 1:1 mixture Apply topically 3 (three) times daily. 05/18/22   Kerin Perna, NP  triamcinolone cream (KENALOG) 0.1 % Apply 1 Application topically 3 (three) times daily. 05/18/22   Kerin Perna, NP  VENTOLIN HFA 108 (90 Base) MCG/ACT inhaler INHALE 2 PUFFS BY MOUTH EVERY 6 HOURS AS NEEDED FOR SHORTNESS OF BREATH OR WHEEZING. 05/06/22   Kerin Perna, NP      Allergies    Hydrocodone-acetaminophen, Lortab [hydrocodone-acetaminophen], Niacin, Penicillin g, and Penicillins    Review of Systems   Review of Systems Ten systems are reviewed and are negative for acute change except as noted in the HPI  Physical Exam Updated Vital Signs BP 135/73 (BP Location: Right Arm)   Pulse (!) 104   Temp 98.6 F (37 C) (Oral)   Resp 16   SpO2 97%  Physical Exam Constitutional:      General: She is not in acute distress.    Appearance: Normal appearance. She is well-developed. She is not ill-appearing or diaphoretic.  HENT:     Head:  Normocephalic and atraumatic.  Eyes:     General: Vision grossly intact. Gaze aligned appropriately.     Pupils: Pupils are equal, round, and reactive to light.  Neck:     Trachea: Trachea and phonation normal.  Cardiovascular:     Pulses:          Dorsalis pedis pulses are 2+ on the right side and 2+ on the left side.  Pulmonary:     Effort: Pulmonary effort is normal. No respiratory distress.  Abdominal:     General: There is no distension.     Palpations: Abdomen is soft.     Tenderness: There is abdominal tenderness in the right lower quadrant, suprapubic area and left lower quadrant. There is no guarding or rebound.  Musculoskeletal:        General: Normal range of motion.     Cervical back: Normal range of motion.  Right lower leg: No edema.     Left lower leg: No edema.     Comments: Right knee: Well-healed surgical scar.  No effusion.  No erythema.  No tenderness palpation.  Good range of motion without pain.  Neurovascular intact distally.  Feet:     Right foot:     Protective Sensation: 3 sites tested.  3 sites sensed.     Left foot:     Protective Sensation: 3 sites tested.  3 sites sensed.  Skin:    General: Skin is warm and dry.  Neurological:     Mental Status: She is alert.     GCS: GCS eye subscore is 4. GCS verbal subscore is 5. GCS motor subscore is 6.     Comments: Speech is clear and goal oriented, follows commands Major Cranial nerves without deficit, no facial droop Moves extremities without ataxia, coordination intact  Psychiatric:        Behavior: Behavior normal.     ED Results / Procedures / Treatments   Labs (all labs ordered are listed, but only abnormal results are displayed) Labs Reviewed  COMPREHENSIVE METABOLIC PANEL - Abnormal; Notable for the following components:      Result Value   Glucose, Bld 107 (*)    Total Protein 6.2 (*)    Albumin 3.3 (*)    AST 52 (*)    ALT 47 (*)    All other components within normal limits  CBC WITH  DIFFERENTIAL/PLATELET - Abnormal; Notable for the following components:   RBC 3.70 (*)    HCT 35.0 (*)    All other components within normal limits  URINALYSIS, ROUTINE W REFLEX MICROSCOPIC - Abnormal; Notable for the following components:   Color, Urine AMBER (*)    APPearance CLOUDY (*)    Protein, ur 30 (*)    Bacteria, UA MANY (*)    All other components within normal limits  RESP PANEL BY RT-PCR (RSV, FLU A&B, COVID)  [id number]  LIPASE, BLOOD    EKG EKG Interpretation  Date/Time:  Sunday July 31 2022 15:12:58 EST Ventricular Rate:  104 PR Interval:  148 QRS Duration: 76 QT Interval:  334 QTC Calculation: 440 R Axis:   31 Text Interpretation: Sinus tachycardia Low voltage, precordial leads Anteroseptal infarct, old when compared to prior, similar appearnce. No STEMI Confirmed by Antony Blackbird [phone]) on 07/31/2022 3:14:44 PM  Radiology VAS [place] LOWER EXTREMITY VENOUS (DVT) (7a-7p)  Result Date: 07/31/2022  Lower Venous DVT Study Patient Name:  JOE TANNEY  Date of Exam:   07/31/2022 Medical Rec #: [id number]      Accession #:    [id number] Date of Birth: 1965-07-19      Patient Gender: F Patient Age:   80 years Exam Location:  [facility] Procedure:      VAS [place] LOWER EXTREMITY VENOUS (DVT) Referring Phys: Nuala Alpha --------------------------------------------------------------------------------  Indications: Pain behind both knees  Comparison Study: No prior study Performing Technologist: Sharion Dove RVS  Examination Guidelines: A complete evaluation includes B-mode imaging, spectral Doppler, color Doppler, and power Doppler as needed of all accessible portions of each vessel. Bilateral testing is considered an integral part of a complete examination. Limited examinations for reoccurring indications may be performed as noted. The reflux portion of the exam is performed with the patient in reverse Trendelenburg.   +---------+---------------+---------+-----------+----------+--------------+ RIGHT    CompressibilityPhasicitySpontaneityPropertiesThrombus Aging +---------+---------------+---------+-----------+----------+--------------+ CFV      Full  Yes      Yes                                 +---------+---------------+---------+-----------+----------+--------------+ SFJ      Full                                                        +---------+---------------+---------+-----------+----------+--------------+ FV Prox  Full                                                        +---------+---------------+---------+-----------+----------+--------------+ FV Mid   Full                                                        +---------+---------------+---------+-----------+----------+--------------+ FV DistalFull                                                        +---------+---------------+---------+-----------+----------+--------------+ PFV      Full                                                        +---------+---------------+---------+-----------+----------+--------------+ POP      Full           Yes      Yes                                 +---------+---------------+---------+-----------+----------+--------------+ PTV      Full                                                        +---------+---------------+---------+-----------+----------+--------------+ PERO     Full                                                        +---------+---------------+---------+-----------+----------+--------------+   +---------+---------------+---------+-----------+----------+--------------+ LEFT     CompressibilityPhasicitySpontaneityPropertiesThrombus Aging +---------+---------------+---------+-----------+----------+--------------+ CFV      Full           Yes      Yes                                  +---------+---------------+---------+-----------+----------+--------------+ SFJ  Full                                                        +---------+---------------+---------+-----------+----------+--------------+ FV Prox  Full                                                        +---------+---------------+---------+-----------+----------+--------------+ FV Mid   Full                                                        +---------+---------------+---------+-----------+----------+--------------+ FV DistalFull                                                        +---------+---------------+---------+-----------+----------+--------------+ PFV      Full                                                        +---------+---------------+---------+-----------+----------+--------------+ POP      Full           Yes      Yes                                 +---------+---------------+---------+-----------+----------+--------------+ PTV      Full                                                        +---------+---------------+---------+-----------+----------+--------------+ PERO     Full                                                        +---------+---------------+---------+-----------+----------+--------------+     Summary: BILATERAL: - No evidence of deep vein thrombosis seen in the lower extremities, bilaterally. -No evidence of popliteal cyst, bilaterally.   *See table(s) above for measurements and observations.    Preliminary    CT ABDOMEN PELVIS W CONTRAST  Result Date: 07/31/2022 CLINICAL DATA:  Bilateral lower quadrant abdominal pain EXAM: CT ABDOMEN AND PELVIS WITH CONTRAST TECHNIQUE: Multidetector CT imaging of the abdomen and pelvis was performed using the standard protocol following bolus administration of intravenous contrast. RADIATION DOSE REDUCTION: This exam was performed according to the departmental dose-optimization program which  includes automated exposure control, adjustment of the mA and/or kV according to patient size and/or use of iterative  reconstruction technique. CONTRAST:  76m OMNIPAQUE IOHEXOL 350 MG/ML SOLN COMPARISON:  CT abdomen and pelvis January 19, 2022 FINDINGS: Lower chest: The visualized bibasilar lungs clear. Normal heart size no pericardial effusion. Hepatobiliary: No focal liver abnormality is seen. No gallstones, gallbladder wall thickening, or biliary dilatation. Pancreas: Unremarkable. No pancreatic ductal dilatation or surrounding inflammatory changes. Spleen: Normal in size without focal abnormality. Adrenals/Urinary Tract: Adrenal glands are unremarkable. Symmetric enhancement of bilateral kidneys no hydronephrosis. Multiple bilateral subcentimeter low-density renal lesions, which are too small to characterize, but likely represent benign cysts. No nephrolithiasis. Bladder is unremarkable. Stomach/Bowel: Redemonstrated small gastric diverticulum. Appendix appears normal. Scattered colonic diverticulosis. No evidence of bowel wall thickening, distention, or inflammatory changes. Vascular/Lymphatic: No significant vascular findings are present. No enlarged abdominal or pelvic lymph nodes. Reproductive: Uterus and bilateral adnexa are unremarkable. Other: Small fat containing umbilical hernia. No abdominopelvic ascites. Musculoskeletal: Redemonstrated degenerative changes within the thoracolumbar spine, including severe endplate sclerosis and disc space narrowing at T10-T11. No acute osseous abnormality. IMPRESSION: No etiology for abdominal pain identified. Electronically Signed   By: MBeryle FlockM.D.   On: 07/31/2022 13:29    Procedures Procedures    Medications Ordered in ED Medications  morphine (PF) 4 MG/ML injection 4 mg (has no administration in time range)  sodium chloride 0.9 % bolus 500 mL (has no administration in time range)  sodium chloride 0.9 % bolus 1,000 mL (0 mLs Intravenous Stopped  07/31/22 1504)  morphine (PF) 4 MG/ML injection 4 mg (4 mg Intravenous Given 07/31/22 1145)  iohexol (OMNIPAQUE) 350 MG/ML injection 75 mL (75 mLs Intravenous Contrast Given 07/31/22 1314)  alum & mag hydroxide-simeth (MAALOX/MYLANTA) 200-200-20 MG/5ML suspension 30 mL (30 mLs Oral Given 07/31/22 1601)    ED Course/ Medical Decision Making/ A&P Clinical Course as of 07/31/22 1602  Sun Jul 31, 2022  1544 1 week of lower abdominal pain and vomiting. CT neg, vasc duplex negative. [JS]    Clinical Course User Index [JS] SDonzetta Matters MD                             Medical Decision Making 57year old female presented for abdominal pain nausea vomiting headache ongoing for the past 1 week.  She present to the ER today because her symptoms were not improving.  On exam she is tender primarily to the lower abdomen.  Differential includes but not limited to appendicitis, diverticulitis, cholecystitis, SBO, UTI, kidney stone disease, viral illness.  She denies any associated chest pain or shortness of breath, low suspicion for ACS, PE, dissection, PNA.  Will obtain labs along with CT abdomen pelvis.  Incidentally patient mentions some cramping behind her right knee past month.  On exam there is no evidence for septic joint or trauma.  Given she has had a prior TKA will obtain a Doppler.  Amount and/or Complexity of Data Reviewed Labs: ordered.    Details: CBC without leukocytosis to suggest acute bacterial process.  No anemia or thrombocytopenia. Urinalysis appears contaminated, no nitrites or leukocytes to suggest UTI. CMP shows slight elevation of AST and ALT, no emergent like hydration, AKI or gap. Lipase normal limits, doubt pancreatitis. COVID/influenza panel and RSV negative. Radiology: ordered.    Details: I have personally reviewed and interpreted patient CT abdomen pelvis.  I do not appreciate any obvious free air or obstruction.  I agree with radiology interpretation.  Risk OTC  drugs. Prescription drug management.   Care handoff given  to Dr. Nicole Kindred at time of shift change.  Plan of care is reassessment after IV fluids and pain medications.  Pending no emergent findings improvement of symptoms possible discharge.  Note: Portions of this report may have been transcribed using voice recognition software. Every effort was made to ensure accuracy; however, inadvertent computerized transcription errors may still be present.         Final Clinical Impression(s) / ED Diagnoses Final diagnoses:  None    Rx / DC Orders ED Discharge Orders     None         Gari Crown 07/31/22 9518    Tegeler, Gwenyth Allegra, MD 08/02/22 1050

## 2022-08-10 ENCOUNTER — Telehealth (INDEPENDENT_AMBULATORY_CARE_PROVIDER_SITE_OTHER): Payer: Self-pay | Admitting: Primary Care

## 2022-08-10 ENCOUNTER — Ambulatory Visit (INDEPENDENT_AMBULATORY_CARE_PROVIDER_SITE_OTHER): Payer: Self-pay | Admitting: *Deleted

## 2022-08-10 DIAGNOSIS — K2100 Gastro-esophageal reflux disease with esophagitis, without bleeding: Secondary | ICD-10-CM

## 2022-08-10 NOTE — Telephone Encounter (Signed)
Patient has Ulcerative Colitis and wants Sharyn Lull to refill her medication for her. Patient says she is in pain and needs her medicine. Patient says she will make an appointment to follow up but she really needs the medication.

## 2022-08-10 NOTE — Telephone Encounter (Addendum)
  Chief Complaint: abdominal pain- severe- colitis flare- out of medication Symptoms: severe abdominal pain- "all over" Frequency: patient is out of medication- 07/27/22 Pertinent Negatives: Patient denies , back pain, diarrhea, fever, urination pain, vomiting Disposition: [x]$ ED /[]$ Urgent Care (no appt availability in office) / []$ Appointment(In office/virtual)/ []$  [facility]/ []$ Home Care/ []$ Refused Recommended Disposition /[]$ [facility]/ []$  Follow-up with PCP Additional Notes: Patient states she does not want/need to be seen again at ED- her provider is aware of what her condition is- please advise- call to office- no answer.

## 2022-08-10 NOTE — Telephone Encounter (Signed)
Reason for Disposition  [1] SEVERE pain AND [2] age > 15 years    Patient states she has colitis- she needs RF of her medication- she will not go to ED/UC- she states PCP knows what is wrong with her.  Answer Assessment - Initial Assessment Questions 1. LOCATION: "Where does it hurt?"      All over 2. RADIATION: "Does the pain shoot anywhere else?" (e.g., chest, back)     *No Answer* 3. ONSET: "When did the pain begin?" (e.g., minutes, hours or days ago)      2/7- colitis flare 4. SUDDEN: "Gradual or sudden onset?"     Medication helped- out of medication 5. PATTERN "Does the pain come and go, or is it constant?"    - If it comes and goes: "How long does it last?" "Do you have pain now?"     (Note: Comes and goes means the pain is intermittent. It goes away completely between bouts.)    - If constant: "Is it getting better, staying the same, or getting worse?"      (Note: Constant means the pain never goes away completely; most serious pain is constant and gets worse.)      constant 6. SEVERITY: "How bad is the pain?"  (e.g., Scale 1-10; mild, moderate, or severe)    - MILD (1-3): Doesn't interfere with normal activities, abdomen soft and not tender to touch.     - MODERATE (4-7): Interferes with normal activities or awakens from sleep, abdomen tender to touch.     - SEVERE (8-10): Excruciating pain, doubled over, unable to do any normal activities.       Severe-9 7. RECURRENT SYMPTOM: "Have you ever had this type of stomach pain before?" If Yes, ask: "When was the last time?" and "What happened that time?"      Colitis  8. CAUSE: "What do you think is causing the stomach pain?"     Colitis 9. RELIEVING/AGGRAVATING FACTORS: "What makes it better or worse?" (e.g., antacids, bending or twisting motion, bowel movement)     Medication  10. OTHER SYMPTOMS: "Do you have any other symptoms?" (e.g., back pain, diarrhea, fever, urination pain, vomiting)       no  Protocols used: Abdominal  Pain - Female-A-AH

## 2022-08-10 NOTE — Telephone Encounter (Signed)
Will forward to provider  

## 2022-08-10 NOTE — Telephone Encounter (Addendum)
F/u after ED visit

## 2022-08-11 MED ORDER — ESOMEPRAZOLE MAGNESIUM 40 MG PO CPDR: DELAYED_RELEASE_CAPSULE | ORAL | 1 refills | Status: DC

## 2022-08-12 ENCOUNTER — Other Ambulatory Visit (INDEPENDENT_AMBULATORY_CARE_PROVIDER_SITE_OTHER): Payer: Self-pay | Admitting: Primary Care

## 2022-08-12 ENCOUNTER — Other Ambulatory Visit: Payer: Self-pay | Admitting: *Deleted

## 2022-08-12 ENCOUNTER — Ambulatory Visit (INDEPENDENT_AMBULATORY_CARE_PROVIDER_SITE_OTHER): Payer: Self-pay | Admitting: *Deleted

## 2022-08-12 NOTE — Telephone Encounter (Signed)
Duplicate request. Requesting sent to a different pharmacy  Requested Prescriptions  Refused Prescriptions Disp Refills   dicyclomine (BENTYL) 20 MG tablet 20 tablet 0    Sig: Take 1 tablet (20 mg total) by mouth 2 (two) times daily.     Gastroenterology:  Antispasmodic Agents Passed - 08/12/2022 10:41 AM      Passed - Valid encounter within last 12 months    Recent Outpatient Visits           2 months ago Rash and nonspecific skin eruption   [place] [facility] Kerin Perna, NP   4 months ago Essential hypertension   [place], Olivarez P, NP   6 months ago Essential hypertension   [place], Bryce P, NP   8 months ago Exposure to sexually transmitted disease (STD)   [place], Michelle P, NP   9 months ago Vaginal itching   [place], [place], NP       Future Appointments             In 2 weeks Oletta Lamas, Milford Cage, NP [place]

## 2022-08-12 NOTE — Telephone Encounter (Signed)
Requested medication (s) are due for refill today: yes  Requested medication (s) are on the active medication list: yes   Last refill:  07/31/22 #20 0 refills  Future visit scheduled: yes in 2 weeks   Notes to clinic:  requesting medication sent to different pharmacy. Last ordered by Donzetta Matters, MD 07/31/22 requesting refill today due to going out of town for funeral     Requested Prescriptions  Pending Prescriptions Disp Refills   dicyclomine (BENTYL) 20 MG tablet 20 tablet 0    Sig: Take 1 tablet (20 mg total) by mouth 2 (two) times daily.     Gastroenterology:  Antispasmodic Agents Passed - 08/12/2022 10:32 AM      Passed - Valid encounter within last 12 months    Recent Outpatient Visits           2 months ago Rash and nonspecific skin eruption   [place] [facility] Kerin Perna, NP   4 months ago Essential hypertension   [place], [place] P, NP   6 months ago Essential hypertension   [place], [place] P, NP   8 months ago Exposure to sexually transmitted disease (STD)   [place], Michelle P, NP   9 months ago Vaginal itching   [place], [place], NP       Future Appointments             In 2 weeks Oletta Lamas, Milford Cage, NP [place]

## 2022-08-12 NOTE — Telephone Encounter (Signed)
Will forward to provider  

## 2022-08-12 NOTE — Telephone Encounter (Signed)
  Chief Complaint: abdominal pain ran out of medication bentyl. Med sent to wrong pharmacy since last seen in hospital  Symptoms: abdominal pain all over Frequency: comes and goes  Pertinent Negatives: Patient denies na Disposition: []$ ED /[]$ Urgent Care (no appt availability in office) / []$ Appointment(In office/virtual)/ []$  [facility]/ [x]$ Home Care/ []$ Refused Recommended Disposition /[]$ [facility]/ []$  Follow-up with PCP Additional Notes:   Requesting refill bentyl sent to different pharmacy. Recommended if pain worsens go to UC/ ED.    Reason for Disposition  [1] MILD-MODERATE pain AND [2] comes and goes (cramps)  Answer Assessment - Initial Assessment Questions 1. LOCATION: "Where does it hurt?"      Abdominal pain  2. RADIATION: "Does the pain shoot anywhere else?" (e.g., chest, back)     na 3. ONSET: "When did the pain begin?" (e.g., minutes, hours or days ago)      Happens when not taking medication bentyl 4. SUDDEN: "Gradual or sudden onset?"     Na  5. PATTERN "Does the pain come and go, or is it constant?"    - If it comes and goes: "How long does it last?" "Do you have pain now?"     (Note: Comes and goes means the pain is intermittent. It goes away completely between bouts.)    - If constant: "Is it getting better, staying the same, or getting worse?"      (Note: Constant means the pain never goes away completely; most serious pain is constant and gets worse.)      Comes and goes when not taking medication  6. SEVERITY: "How bad is the pain?"  (e.g., Scale 1-10; mild, moderate, or severe)    - MILD (1-3): Doesn't interfere with normal activities, abdomen soft and not tender to touch.     - MODERATE (4-7): Interferes with normal activities or awakens from sleep, abdomen tender to touch.     - SEVERE (8-10): Excruciating pain, doubled over, unable to do any normal activities.       na 7. RECURRENT SYMPTOM: "Have you ever had this type of stomach pain  before?" If Yes, ask: "When was the last time?" and "What happened that time?"      Yes  8. CAUSE: "What do you think is causing the stomach pain?"     Ran out of medication  9. RELIEVING/AGGRAVATING FACTORS: "What makes it better or worse?" (e.g., antacids, bending or twisting motion, bowel movement)     Medication  10. OTHER SYMPTOMS: "Do you have any other symptoms?" (e.g., back pain, diarrhea, fever, urination pain, vomiting)       Abdominal pain all over 11. PREGNANCY: "Is there any chance you are pregnant?" "When was your last menstrual period?"       na  Protocols used: Abdominal Pain - [facility]

## 2022-08-12 NOTE — Telephone Encounter (Signed)
Medication Refill - Medication: dicyclomine (BENTYL) 20 MG tablet   Has the patient contacted their pharmacy? Yes.   (Agent: If no, request that the patient contact the pharmacy for the refill. If patient does not wish to contact the pharmacy document the reason why and proceed with request.) (Agent: If yes, when and what did the pharmacy advise?)  Preferred Pharmacy (with phone number or street name):  [place], [place] [facility] [address]  [place] [address]  Phone: [phone] Fax: [phone]   Has the patient been seen for an appointment in the last year OR does the patient have an upcoming appointment? Yes.    Agent: Please be advised that RX refills may take up to 3 business days. We ask that you follow-up with your pharmacy.

## 2022-08-14 NOTE — Telephone Encounter (Signed)
Spoke with patient never prescribed Bentyl. Asked if diarrhea she can take OTC imodium.  She needs to f/u with GI or schedule appt.

## 2022-08-22 ENCOUNTER — Ambulatory Visit (INDEPENDENT_AMBULATORY_CARE_PROVIDER_SITE_OTHER): Payer: Medicaid Other | Admitting: Primary Care

## 2022-08-24 ENCOUNTER — Other Ambulatory Visit (INDEPENDENT_AMBULATORY_CARE_PROVIDER_SITE_OTHER): Payer: Self-pay | Admitting: Primary Care

## 2022-08-24 DIAGNOSIS — I10 Essential (primary) hypertension: Secondary | ICD-10-CM

## 2022-08-24 DIAGNOSIS — F5101 Primary insomnia: Secondary | ICD-10-CM

## 2022-08-24 DIAGNOSIS — R21 Rash and other nonspecific skin eruption: Secondary | ICD-10-CM

## 2022-08-24 NOTE — Telephone Encounter (Signed)
Will forward to provider  

## 2022-08-25 ENCOUNTER — Other Ambulatory Visit (INDEPENDENT_AMBULATORY_CARE_PROVIDER_SITE_OTHER): Payer: Self-pay | Admitting: Primary Care

## 2022-08-25 DIAGNOSIS — I10 Essential (primary) hypertension: Secondary | ICD-10-CM

## 2022-08-25 MED ORDER — AMLODIPINE BESYLATE 5 MG PO TABS: 5.0000 mg | ORAL_TABLET | Freq: Every day | ORAL | 0 refills | Status: DC

## 2022-08-29 ENCOUNTER — Other Ambulatory Visit: Payer: Self-pay

## 2022-08-29 ENCOUNTER — Encounter (INDEPENDENT_AMBULATORY_CARE_PROVIDER_SITE_OTHER): Payer: Self-pay | Admitting: Primary Care

## 2022-08-29 ENCOUNTER — Ambulatory Visit (INDEPENDENT_AMBULATORY_CARE_PROVIDER_SITE_OTHER): Payer: Medicaid Other | Admitting: Primary Care

## 2022-08-29 VITALS — BP 131/88 | HR 80 | Resp 16 | Wt 203.2 lb

## 2022-08-29 DIAGNOSIS — Z760 Encounter for issue of repeat prescription: Secondary | ICD-10-CM | POA: Diagnosis not present

## 2022-08-29 DIAGNOSIS — K2100 Gastro-esophageal reflux disease with esophagitis, without bleeding: Secondary | ICD-10-CM

## 2022-08-29 DIAGNOSIS — I10 Essential (primary) hypertension: Secondary | ICD-10-CM | POA: Diagnosis not present

## 2022-08-29 MED ORDER — ATORVASTATIN CALCIUM 20 MG PO TABS: ORAL_TABLET | ORAL | 1 refills | Status: DC

## 2022-08-29 MED ORDER — AMLODIPINE BESYLATE 5 MG PO TABS: 5.0000 mg | ORAL_TABLET | Freq: Every day | ORAL | 1 refills | Status: DC

## 2022-08-29 MED ORDER — HYDROCHLOROTHIAZIDE 25 MG PO TABS: 25.0000 mg | ORAL_TABLET | Freq: Every day | ORAL | 3 refills | Status: DC

## 2022-08-29 MED ORDER — CELECOXIB 100 MG PO CAPS: 100.0000 mg | ORAL_CAPSULE | Freq: Two times a day (BID) | ORAL | 1 refills | Status: DC

## 2022-08-29 MED ORDER — ESOMEPRAZOLE MAGNESIUM 40 MG PO CPDR: DELAYED_RELEASE_CAPSULE | ORAL | 1 refills | Status: DC

## 2022-08-29 NOTE — Progress Notes (Signed)
[place], is a 57 y.o. female  [id number]  [id number]  DOB - July 01, 1965  Chief Complaint  Patient presents with   Hospitalization Follow-up    ED/Urgent Care        Subjective:   Heather Perry is a 57 y.o. female here today for a follow up visit. She c/o abdominal pain that she contributes to her colitis. Requesting refills on her acid reflex medication. Patient has No headache, No chest pain, No abdominal pain - No Nausea, No new weakness tingling or numbness, No Cough - shortness of breath  No problems updated.  Allergies  Allergen Reactions   Hydrocodone-Acetaminophen Itching    Started itching.  Pt states that she can take this medication Other reaction(s): Other (See Comments) Pruritus   Lortab [Hydrocodone-Acetaminophen]     Started itching   Niacin Hives   Penicillin G Hives and Nausea And Vomiting    Did it involve swelling of the face/tongue/throat, SOB, or low BP? No Did it involve sudden or severe rash/hives, skin peeling, or any reaction on the inside of your mouth or nose? Yes Did you need to seek medical attention at a hospital or doctor's office? No When did it last happen?    Over 20 years Ago   If all above answers are "NO", may proceed with cephalosporin use.     Penicillins Itching    Past Medical History:  Diagnosis Date   Allergy    Anxiety    Arthritis    Asthma    Blood transfusion without reported diagnosis    Depression    GERD (gastroesophageal reflux disease)    High cholesterol    Hypertension    Obesity    Osteoporosis    Recurrent boils    Seizures ([place])    last seizure "3 years ago" per pt 06-16-16 KBW   Sleep apnea    no CPAP used   Uterine fibroid     Current Outpatient Medications on File Prior to Visit  Medication Sig Dispense Refill   AMITIZA 24 MCG capsule TAKE 1 CAPSULE (24 MCG TOTAL) BY MOUTH 2 (TWO) TIMES DAILY WITH A MEAL. 60 capsule 1   aspirin EC 81 MG tablet Take  81 mg by mouth daily.     budesonide-formoterol (SYMBICORT) 160-4.5 MCG/ACT inhaler Inhale 2 puffs into the lungs 2 (two) times daily. (Patient taking differently: Inhale 2 puffs into the lungs 2 (two) times daily as needed (wheezing, sob).) 1 Inhaler 1   clonazePAM (KLONOPIN) 1 MG tablet Take 0.5 mg by mouth 2 (two) times daily as needed for anxiety.     cyclobenzaprine (FLEXERIL) 10 MG tablet TAKE 1 TABLET (10 MG TOTAL) BY MOUTH 3 (THREE) TIMES DAILY AS NEEDED FOR MUSCLE SPASMS. 90 tablet 1   dicyclomine (BENTYL) 20 MG tablet Take 1 tablet (20 mg total) by mouth 2 (two) times daily. 20 tablet 0   fluticasone (FLONASE) 50 MCG/ACT nasal spray PLACE 2 SPRAYS INTO BOTH NOSTRILS DAILY. 16 g 1   gabapentin (NEURONTIN) 300 MG capsule Take 300 mg by mouth 3 (three) times daily.     hydroxychloroquine (PLAQUENIL) 200 MG tablet Take 400 mg by mouth daily.     hydroxypropyl methylcellulose / hypromellose (ISOPTO TEARS / GONIOVISC) 2.5 % ophthalmic solution Place 1 drop into both eyes 3 (three) times daily as needed for dry eyes. 15 mL 3   ipratropium-albuterol (DUONEB) 0.5-2.5 (3) MG/3ML SOLN Take 3 mLs by nebulization every 6 (six) hours as  needed. (Patient taking differently: Take 3 mLs by nebulization every 6 (six) hours as needed (for SOB).) 360 mL 1   leflunomide (ARAVA) 20 MG tablet Take 20 mg by mouth daily.     lidocaine (LIDODERM) 5 % Place 1 patch onto the skin daily. Remove & Discard patch within 12 hours or as directed by MD 30 patch 0   methylPREDNISolone (MEDROL DOSEPAK) 4 MG TBPK tablet Take as direted 1 each 0   Misc. Devices (WALKER) MISC 1 Device by Does not apply route daily as needed. 1 each 0   oxyCODONE (ROXICODONE) 15 MG immediate release tablet Take 15 mg by mouth 4 (four) times daily as needed for pain.     permethrin (ELIMITE) 5 % cream Apply to affected area once 60 g 0   phenytoin (DILANTIN) 100 MG ER capsule Take 100 mg by mouth 3 (three) times daily.     traZODone (DESYREL) 50  MG tablet TAKE 1/2 TO 1 TABLET BY MOUTH AT BEDTIME AS NEEDED FOR SLEEP. 30 tablet 3   triamcinolone 0.1% oint-Eucerin equivalent cream 1:1 mixture Apply topically 3 (three) times daily. 463 g 3   triamcinolone cream (KENALOG) 0.1 % Apply 1 Application topically 3 (three) times daily. 453.6 g 1   VENTOLIN HFA 108 (90 Base) MCG/ACT inhaler INHALE 2 PUFFS BY MOUTH EVERY 6 HOURS AS NEEDED FOR SHORTNESS OF BREATH OR WHEEZING. 18 g 1   No current facility-administered medications on file prior to visit.    Objective:   Vitals:   08/29/22 1053  BP: 131/88  Pulse: 80  Resp: 16  SpO2: 98%  Weight: 203 lb 3.2 oz (92.2 kg)    Comprehensive ROS Pertinent positive and negative noted in HPI   Exam General appearance : Awake, alert, not in any distress. Speech Clear. Not toxic looking HEENT: Atraumatic and Normocephalic, pupils equally reactive to light and accomodation Neck: Supple, no JVD. No cervical lymphadenopathy.  Chest: Good air entry bilaterally, no added sounds  CVS: S1 S2 regular, no murmurs.  Abdomen: Bowel sounds present, Non tender and not distended with no gaurding, rigidity or rebound. Extremities: B/L Lower Ext shows no edema, both legs are warm to touch Neurology: Awake alert, and oriented X 3, CN II-XII intact, Non focal Skin: No Rash  Data Review Lab Results  Component Value Date   HGBA1C 5.1 10/28/2021   HGBA1C 5.3 04/25/2016    Assessment & Plan  Heather Perry was seen today for hospitalization follow-up.  Diagnoses and all orders for this visit:  Essential hypertension BP goal - < 130/80 Explained that having normal blood pressure is the goal and medications are helping to get to goal and maintain normal blood pressure. DIET: Limit salt intake, read nutrition labels to check salt content, limit fried and high fatty foods  Avoid using multisymptom OTC cold preparations that generally contain sudafed which can rise BP. Consult with pharmacist on best cold relief  products to use for persons with HTN EXERCISE Discussed incorporating exercise such as walking - 30 minutes most days of the week and can do in 10 minute intervals      Gastroesophageal reflux disease with esophagitis without hemorrhage -     esomeprazole (NEXIUM) 40 MG capsule; Take one capsule as needed for indigestion  Other orders / Medication refill  -     atorvastatin (LIPITOR) 20 MG tablet; TAKE ONE TABLET BY MOUTH ONCE DAILY FOR CHOLESTEROL -     celecoxib (CELEBREX) 100 MG capsule; Take 1 capsule (  100 mg total) by mouth 2 (two) times daily. -     hydrochlorothiazide (HYDRODIURIL) 25 MG tablet; Take 1 tablet (25 mg total) by mouth daily. -     amLODipine (NORVASC) 5 MG tablet; Take 1 tablet (5 mg total) by mouth daily.    Patient have been counseled extensively about nutrition and exercise. Other issues discussed during this visit include: low cholesterol diet, weight control and daily exercise, foot care, annual eye examinations at Ophthalmology, importance of adherence with medications and regular follow-up. We also discussed long term complications of uncontrolled diabetes and hypertension.     The patient was given clear instructions to go to ER or return to medical center if symptoms don't improve, worsen or new problems develop. The patient verbalized understanding. The patient was told to call to get lab results if they haven't heard anything in the next week.   This note has been created with Surveyor, quantity. Any transcriptional errors are unintentional.   Heather Perna, NP 08/29/2022, 11:05 AM

## 2022-09-05 ENCOUNTER — Other Ambulatory Visit (INDEPENDENT_AMBULATORY_CARE_PROVIDER_SITE_OTHER): Payer: Self-pay | Admitting: Primary Care

## 2022-09-05 DIAGNOSIS — J45909 Unspecified asthma, uncomplicated: Secondary | ICD-10-CM

## 2022-09-14 ENCOUNTER — Other Ambulatory Visit (INDEPENDENT_AMBULATORY_CARE_PROVIDER_SITE_OTHER): Payer: Self-pay | Admitting: Primary Care

## 2022-09-14 DIAGNOSIS — F5101 Primary insomnia: Secondary | ICD-10-CM

## 2022-09-14 NOTE — Telephone Encounter (Signed)
Will forward to provider  

## 2022-09-22 ENCOUNTER — Other Ambulatory Visit (INDEPENDENT_AMBULATORY_CARE_PROVIDER_SITE_OTHER): Payer: Self-pay | Admitting: Primary Care

## 2022-09-22 NOTE — Telephone Encounter (Signed)
Requested Prescriptions  Pending Prescriptions Disp Refills   fluticasone (FLONASE) 50 MCG/ACT nasal spray [Pharmacy Med Name: FLUTICASONE PROPIONATE 50 MCG/ACT NASAL SUSPENSION] 16 g 1    Sig: PLACE 2 SPRAYS INTO BOTH NOSTRILS DAILY.     Ear, Nose, and Throat: Nasal Preparations - Corticosteroids Passed - 09/22/2022 10:54 AM      Passed - Valid encounter within last 12 months    Recent Outpatient Visits           3 weeks ago Medication refill   [place], [place], NP   4 months ago Rash and nonspecific skin eruption   [place] [facility] Kerin Perna, NP   5 months ago Essential hypertension   [place], Edgar P, NP   7 months ago Essential hypertension   [place], [place] P, NP   9 months ago Exposure to sexually transmitted disease (STD)   [place], [place], NP       Future Appointments             In 6 days Oletta Lamas, [place] Cage, NP [place]

## 2022-09-28 ENCOUNTER — Ambulatory Visit (INDEPENDENT_AMBULATORY_CARE_PROVIDER_SITE_OTHER): Payer: Medicaid Other | Admitting: Primary Care

## 2022-09-28 ENCOUNTER — Other Ambulatory Visit (HOSPITAL_COMMUNITY)
Admission: RE | Admit: 2022-09-28 | Discharge: 2022-09-28 | Disposition: A | Discharge status: Home / Self Care | Payer: Medicaid Other | Source: Ambulatory Visit | Attending: Primary Care | Admitting: Primary Care

## 2022-09-28 ENCOUNTER — Encounter (INDEPENDENT_AMBULATORY_CARE_PROVIDER_SITE_OTHER): Payer: Self-pay | Admitting: Primary Care

## 2022-09-28 VITALS — BP 118/80 | HR 100 | Temp 97.7°F | Resp 20 | Wt 211.0 lb

## 2022-09-28 DIAGNOSIS — Z124 Encounter for screening for malignant neoplasm of cervix: Secondary | ICD-10-CM | POA: Insufficient documentation

## 2022-09-28 NOTE — Progress Notes (Signed)
  [facility]  WELL-WOMAN PHYSICAL & PAP Patient name: Heather Perry MRN [id number]  Date of birth: [birth year] Chief Complaint:   Gynecologic Exam  History of Present Illness:   Heather Perry is a 57 y.o. No obstetric history on file. female being seen today for a routine well-woman exam.   CC:gyn   The current method of family planning is post menopausal status.  No LMP recorded. Patient has had a hysterectomy. Last pap 04/26/19.  Last mammogram: 05/09/22.  Family h/o breast cancer: no  Last colonoscopy: 06/30/16. Results were: normal. Family h/o colorectal cancer: No  Review of Systems:    Denies any headaches, blurred vision, fatigue, shortness of breath, chest pain, abdominal pain, abnormal vaginal discharge/itching/odor/irritation, problems with periods, bowel movements, urination, or intercourse unless otherwise stated above.  Pertinent History Reviewed:   Reviewed past medical,surgical, social and family history.  Reviewed problem list, medications and allergies.  Physical Assessment:   Vitals:   09/28/22 1150  BP: 118/80  Pulse: 100  Resp: 20  Temp: 97.7 F (36.5 C)  TempSrc: Oral  SpO2: 97%  Weight: 211 lb (95.7 kg)  Body mass index is 38.59 kg/m.        Physical Examination:  General appearance - well appearing, and in no distress Mental status - alert, oriented to person, place, and time Psych:  She has a normal mood and affect Skin - warm and dry, normal color, no suspicious lesions noted Chest - effort normal, all lung fields clear to auscultation bilaterally Heart - normal rate and regular rhythm Neck:  midline trachea, no thyromegaly or nodules Breasts - breasts appear normal, no suspicious masses, no skin or nipple changes or axillary nodes Educated patient on proper self breast examination and had patient to demonstrate SBE. Abdomen - soft, nontender, nondistended, no masses or organomegaly Pelvic-VULVA: normal appearing vulva with no  masses, tenderness or lesions   VAGINA: normal appearing vagina with normal color and discharge, no lesions   CERVIX: normal appearing cervix without discharge or lesions, no CMT UTERUS: uterus is felt to be normal size, shape, consistency and nontender  ADNEXA: No adnexal masses or tenderness noted. Extremities:  No swelling or varicosities noted  No results found for this or any previous visit (from the past 24 hour(s)).   Assessment & Plan:  Caron was seen today for gynecologic exam.  Diagnoses and all orders for this visit:  Cervical cancer screening -     Cervicovaginal ancillary only -     Cytology - PAP([place])      Follow-up: No follow-ups on file.  This note has been created with Education officer, environmental. Any transcriptional errors are unintentional.   Grayce Sessions, NP 10/02/2022, 1:01 AM

## 2022-09-28 NOTE — Progress Notes (Signed)
PAP

## 2022-09-29 LAB — CERVICOVAGINAL ANCILLARY ONLY
Bacterial Vaginitis (gardnerella): NEGATIVE
Candida Glabrata: NEGATIVE
Candida Vaginitis: NEGATIVE
Chlamydia: NEGATIVE
Comment: NEGATIVE
Comment: NEGATIVE
Comment: NEGATIVE
Comment: NEGATIVE
Comment: NEGATIVE
Comment: NORMAL
Neisseria Gonorrhea: NEGATIVE
Trichomonas: NEGATIVE

## 2022-10-03 LAB — CYTOLOGY - PAP
Chlamydia: NEGATIVE
Comment: NEGATIVE
Comment: NEGATIVE
Comment: NEGATIVE
Comment: NORMAL
Diagnosis: NEGATIVE
High risk HPV: NEGATIVE
Neisseria Gonorrhea: NEGATIVE
Trichomonas: NEGATIVE

## 2022-10-06 ENCOUNTER — Other Ambulatory Visit (INDEPENDENT_AMBULATORY_CARE_PROVIDER_SITE_OTHER): Payer: Self-pay | Admitting: Primary Care

## 2022-10-06 NOTE — Telephone Encounter (Signed)
Copied from CRM [id number]. Topic: General - Other >> Oct 06, 2022 10:08 AM Everette C wrote: Reason for CRM: Medication Refill - Medication: phenytoin (DILANTIN) 100 MG ER capsule [[id number]]  fluticasone (FLONASE) 50 MCG/ACT nasal spray [[id number]]  Has the patient contacted their pharmacy? Yes.   (Agent: If no, request that the patient contact the pharmacy for the refill. If patient does not wish to contact the pharmacy document the reason why and proceed with request.) (Agent: If yes, when and what did the pharmacy advise?)  Preferred Pharmacy (with phone number or street name): [facility] - [place], [place] - [address] [place] [address] Phone: [phone] Fax: [phone] Hours: Not open 24 hours   Has the patient been seen for an appointment in the last year OR does the patient have an upcoming appointment? Yes.    Agent: Please be advised that RX refills may take up to 3 business days. We ask that you follow-up with your pharmacy.

## 2022-10-07 NOTE — Telephone Encounter (Signed)
Will forward to provider  

## 2022-10-07 NOTE — Telephone Encounter (Signed)
Unable to refill per protocol, Rx request for Flonase is too soon. Last refill 09/22/22 for 16 g and 1 refill.  Requested Prescriptions  Pending Prescriptions Disp Refills   phenytoin (DILANTIN) 100 MG ER capsule      Sig: Take 1 capsule (100 mg total) by mouth 3 (three) times daily.     Not Delegated - Neurology:  Anticonvulsants - phenytoin Failed - 10/06/2022  1:23 PM      Failed - This refill cannot be delegated      Failed - ALT in normal range and within 360 days    ALT  Date Value Ref Range Status  07/31/2022 47 (H) 0 - 44 U/L Final    Comment:    HEMOLYSIS AT THIS LEVEL MAY AFFECT RESULT         Failed - AST in normal range and within 360 days    AST  Date Value Ref Range Status  07/31/2022 52 (H) 15 - 41 U/L Final    Comment:    HEMOLYSIS AT THIS LEVEL MAY AFFECT RESULT         Failed - HCT in normal range and within 360 days    HCT  Date Value Ref Range Status  07/31/2022 35.0 (L) 36.0 - 46.0 % Final   Hematocrit  Date Value Ref Range Status  01/11/2017 38.4 34.0 - 46.6 % Final         Failed - Phenytoin (serum) in normal range and within 360 days    No results found for: "PHENYTOIN", "PHENYTFREE", "PHENYTOINBD", "PHENPERCFREE"       Passed - HGB in normal range and within 360 days    Hemoglobin  Date Value Ref Range Status  07/31/2022 12.1 12.0 - 15.0 g/dL Final  16/03/9603 54.0 11.1 - 15.9 g/dL Final         Passed - PLT in normal range and within 360 days    Platelets  Date Value Ref Range Status  07/31/2022 153 150 - 400 K/uL Final  01/11/2017 173 150 - 379 x10E3/uL Final         Passed - WBC in normal range and within 360 days    WBC  Date Value Ref Range Status  07/31/2022 4.2 4.0 - 10.5 K/uL Final         Passed - Completed PHQ-2 or PHQ-9 in the last 360 days      Passed - Patient is not pregnant      Passed - Valid encounter within last 12 months    Recent Outpatient Visits           1 week ago Cervical cancer screening   [place]  [facility] Grayce Sessions, NP   1 month ago Medication refill   [place] [facility] Grayce Sessions, NP   4 months ago Rash and nonspecific skin eruption   [place] [facility] Grayce Sessions, NP   5 months ago Essential hypertension   [place] [facility] Grayce Sessions, NP   7 months ago Essential hypertension   [employer] [facility] Grayce Sessions, NP       Future Appointments             In 5 days Grayce Sessions, NP [place] [facility]             fluticasone (FLONASE) 50 MCG/ACT nasal spray 16 g 1    Sig: Place  2 sprays into both nostrils daily.     Ear, Nose, and Throat: Nasal Preparations - Corticosteroids Passed - 10/06/2022  1:23 PM      Passed - Valid encounter within last 12 months    Recent Outpatient Visits           1 week ago Cervical cancer screening   [place] [facility] Grayce Sessions, NP   1 month ago Medication refill   [place] [facility] Grayce Sessions, NP   4 months ago Rash and nonspecific skin eruption   [place] [facility] Grayce Sessions, NP   5 months ago Essential hypertension   [place] [facility] Grayce Sessions, NP   7 months ago Essential hypertension   [place] [facility] Grayce Sessions, NP       Future Appointments             In 5 days Randa Evens, Kinnie Scales, NP [place] [facility]

## 2022-10-07 NOTE — Telephone Encounter (Signed)
Requested medication (s) are due for refill today: routing for review  Requested medication (s) are on the active medication list: historical  Last refill:  01/19/22  Future visit scheduled: yes  Notes to clinic:  Unable to refill per protocol, last refill by another provider.  Historical provider, routing for review.     Requested Prescriptions  Pending Prescriptions Disp Refills   phenytoin (DILANTIN) 100 MG ER capsule      Sig: Take 1 capsule (100 mg total) by mouth 3 (three) times daily.     Not Delegated - Neurology:  Anticonvulsants - phenytoin Failed - 10/06/2022  1:23 PM      Failed - This refill cannot be delegated      Failed - ALT in normal range and within 360 days    ALT  Date Value Ref Range Status  07/31/2022 47 (H) 0 - 44 U/L Final    Comment:    HEMOLYSIS AT THIS LEVEL MAY AFFECT RESULT         Failed - AST in normal range and within 360 days    AST  Date Value Ref Range Status  07/31/2022 52 (H) 15 - 41 U/L Final    Comment:    HEMOLYSIS AT THIS LEVEL MAY AFFECT RESULT         Failed - HCT in normal range and within 360 days    HCT  Date Value Ref Range Status  07/31/2022 35.0 (L) 36.0 - 46.0 % Final   Hematocrit  Date Value Ref Range Status  01/11/2017 38.4 34.0 - 46.6 % Final         Failed - Phenytoin (serum) in normal range and within 360 days    No results found for: "PHENYTOIN", "PHENYTFREE", "PHENYTOINBD", "PHENPERCFREE"       Passed - HGB in normal range and within 360 days    Hemoglobin  Date Value Ref Range Status  07/31/2022 12.1 12.0 - 15.0 g/dL Final  45/40/9811 91.4 11.1 - 15.9 g/dL Final         Passed - PLT in normal range and within 360 days    Platelets  Date Value Ref Range Status  07/31/2022 153 150 - 400 K/uL Final  01/11/2017 173 150 - 379 x10E3/uL Final         Passed - WBC in normal range and within 360 days    WBC  Date Value Ref Range Status  07/31/2022 4.2 4.0 - 10.5 K/uL Final         Passed - Completed  PHQ-2 or PHQ-9 in the last 360 days      Passed - Patient is not pregnant      Passed - Valid encounter within last 12 months    Recent Outpatient Visits           1 week ago Cervical cancer screening   [place] [facility] Grayce Sessions, NP   1 month ago Medication refill   [place] [facility] Grayce Sessions, NP   4 months ago Rash and nonspecific skin eruption   [place] [facility] Grayce Sessions, NP   5 months ago Essential hypertension   [place] [facility] Grayce Sessions, NP   7 months ago Essential hypertension   [place] [facility] Grayce Sessions, NP       Future Appointments             In 5 days  Grayce Sessions, NP [place] [facility]            Refused Prescriptions Disp Refills   fluticasone (FLONASE) 50 MCG/ACT nasal spray 16 g 1    Sig: Place 2 sprays into both nostrils daily.     Ear, Nose, and Throat: Nasal Preparations - Corticosteroids Passed - 10/06/2022  1:23 PM      Passed - Valid encounter within last 12 months    Recent Outpatient Visits           1 week ago Cervical cancer screening   [place] [facility] Grayce Sessions, NP   1 month ago Medication refill   [place] [facility] Grayce Sessions, NP   4 months ago Rash and nonspecific skin eruption   [place] [facility] Grayce Sessions, NP   5 months ago Essential hypertension   [place] [facility] Grayce Sessions, NP   7 months ago Essential hypertension   [place] [facility] Grayce Sessions, NP       Future Appointments             In 5 days Randa Evens, Kinnie Scales, NP [place] [facility]

## 2022-10-12 ENCOUNTER — Encounter (INDEPENDENT_AMBULATORY_CARE_PROVIDER_SITE_OTHER): Payer: Self-pay | Admitting: Primary Care

## 2022-10-12 ENCOUNTER — Ambulatory Visit (INDEPENDENT_AMBULATORY_CARE_PROVIDER_SITE_OTHER): Payer: Self-pay

## 2022-10-12 ENCOUNTER — Other Ambulatory Visit (INDEPENDENT_AMBULATORY_CARE_PROVIDER_SITE_OTHER): Payer: Self-pay | Admitting: Primary Care

## 2022-10-12 DIAGNOSIS — R051 Acute cough: Secondary | ICD-10-CM

## 2022-10-12 MED ORDER — BENZONATATE 100 MG PO CAPS
100.0000 mg | ORAL_CAPSULE | Freq: Two times a day (BID) | ORAL | 0 refills | Status: DC | PRN
Start: 2022-10-12 — End: 2023-03-15

## 2022-10-12 NOTE — Telephone Encounter (Signed)
Patient called, no answer, no voicemail.  Summary: Cough, congestion   The patient called in stating she has been having a bad cough and congestion since she saw her provider on April 10th. She has been on allergy medication but states it no longer helps. Please assist patient further. She uses  [facility] - [place], [place] - [address] Phone: [phone] Fax: [phone]

## 2022-10-12 NOTE — Telephone Encounter (Signed)
Will forward to proivder 

## 2022-10-12 NOTE — Telephone Encounter (Signed)
3rd attempt, pt called, not able to LVM d/t no VM. Unable to reach patient after 3 attempts by [facility] NT, routing to the provider for resolution per protocol.  Summary: Cough, congestion    The patient called in stating she has been having a bad cough and congestion since she saw her provider on April 10th. She has been on allergy medication but states it no longer helps. Please assist patient further. She uses  [facility] - [place], [place] - [address] Phone: [phone] Fax: [phone]

## 2022-10-12 NOTE — Telephone Encounter (Signed)
Pt called, unable to LVM d/t no VM available. Will try again later.   Summary: Cough, congestion   The patient called in stating she has been having a bad cough and congestion since she saw her provider on April 10th. She has been on allergy medication but states it no longer helps. Please assist patient further. She uses  [facility] - [place], [place] - [address] Phone: [phone] Fax: [phone]

## 2022-10-13 ENCOUNTER — Other Ambulatory Visit (INDEPENDENT_AMBULATORY_CARE_PROVIDER_SITE_OTHER): Payer: Self-pay | Admitting: Primary Care

## 2022-10-13 NOTE — Telephone Encounter (Signed)
Contacted pt and made aware that [facility] sent in cough medication. Pt states she is requesting Dilantin  for her seizures. I asked pt if she sees a neurologist pt states she does. I informed pt that she will need to contact her neurologist to get her Dilantin. Pt then stated that her pcp can fill the medication. I informed pt that per Marcelino Duster she will not refill she will need to contact neurologist and pt hung up

## 2022-10-13 NOTE — Telephone Encounter (Signed)
Requested medications are due for refill today.  unsure  Requested medications are on the active medications list.  yes  Last refill. 01/19/2022   Future visit scheduled.   yes  Notes to clinic.  Medication is historical.    Requested Prescriptions  Pending Prescriptions Disp Refills   phenytoin (DILANTIN) 100 MG ER capsule      Sig: Take 1 capsule (100 mg total) by mouth 3 (three) times daily.     Not Delegated - Neurology:  Anticonvulsants - phenytoin Failed - 10/13/2022  9:11 AM      Failed - This refill cannot be delegated      Failed - ALT in normal range and within 360 days    ALT  Date Value Ref Range Status  07/31/2022 47 (H) 0 - 44 U/L Final    Comment:    HEMOLYSIS AT THIS LEVEL MAY AFFECT RESULT         Failed - AST in normal range and within 360 days    AST  Date Value Ref Range Status  07/31/2022 52 (H) 15 - 41 U/L Final    Comment:    HEMOLYSIS AT THIS LEVEL MAY AFFECT RESULT         Failed - HCT in normal range and within 360 days    HCT  Date Value Ref Range Status  07/31/2022 35.0 (L) 36.0 - 46.0 % Final   Hematocrit  Date Value Ref Range Status  01/11/2017 38.4 34.0 - 46.6 % Final         Failed - Phenytoin (serum) in normal range and within 360 days    No results found for: "PHENYTOIN", "PHENYTFREE", "PHENYTOINBD", "PHENPERCFREE"       Passed - HGB in normal range and within 360 days    Hemoglobin  Date Value Ref Range Status  07/31/2022 12.1 12.0 - 15.0 g/dL Final  16/03/9603 54.0 11.1 - 15.9 g/dL Final         Passed - PLT in normal range and within 360 days    Platelets  Date Value Ref Range Status  07/31/2022 153 150 - 400 K/uL Final  01/11/2017 173 150 - 379 x10E3/uL Final         Passed - WBC in normal range and within 360 days    WBC  Date Value Ref Range Status  07/31/2022 4.2 4.0 - 10.5 K/uL Final         Passed - Completed PHQ-2 or PHQ-9 in the last 360 days      Passed - Patient is not pregnant      Passed - Valid  encounter within last 12 months    Recent Outpatient Visits           2 weeks ago Cervical cancer screening   [place] [facility] Grayce Sessions, NP   1 month ago Medication refill   [place] [facility] Grayce Sessions, NP   4 months ago Rash and nonspecific skin eruption   [place] [facility] Grayce Sessions, NP   6 months ago Essential hypertension   [place] [facility] Grayce Sessions, NP   8 months ago Essential hypertension   [place] [facility] Grayce Sessions, NP       Future Appointments             In 2 weeks Randa Evens, Kinnie Scales, NP [place] [facility]

## 2022-10-13 NOTE — Telephone Encounter (Signed)
Medication Refill - Medication:  phenytoin (DILANTIN) 100 MG ER capsule   Has the patient contacted their pharmacy? Yes.   Pharmacy gave patient 12 pills to last her until she could get the script from her PCP  Preferred Pharmacy (with phone number or street name):  [facility] - [place], [place] - [address] Phone: [phone]  Fax: [phone]      Has the patient been seen for an appointment in the last year OR does the patient have an upcoming appointment? No.  Agent: Please be advised that RX refills may take up to 3 business days. We ask that you follow-up with your pharmacy.  Patient had not had her medication in a month until yesterday. She says her Neurologist is in [place] but she does not have a way.

## 2022-10-14 NOTE — Telephone Encounter (Signed)
Will forward to provider  

## 2022-10-28 ENCOUNTER — Encounter (INDEPENDENT_AMBULATORY_CARE_PROVIDER_SITE_OTHER): Payer: Self-pay | Admitting: Primary Care

## 2022-10-28 ENCOUNTER — Ambulatory Visit (INDEPENDENT_AMBULATORY_CARE_PROVIDER_SITE_OTHER): Payer: Medicaid Other | Admitting: Primary Care

## 2022-10-28 VITALS — BP 140/82 | HR 100 | Resp 16

## 2022-10-28 DIAGNOSIS — E2839 Other primary ovarian failure: Secondary | ICD-10-CM

## 2022-10-28 DIAGNOSIS — I10 Essential (primary) hypertension: Secondary | ICD-10-CM

## 2022-10-28 DIAGNOSIS — Z79899 Other long term (current) drug therapy: Secondary | ICD-10-CM | POA: Diagnosis not present

## 2022-10-28 DIAGNOSIS — E785 Hyperlipidemia, unspecified: Secondary | ICD-10-CM | POA: Diagnosis not present

## 2022-10-28 NOTE — Progress Notes (Signed)
physical

## 2022-10-28 NOTE — Progress Notes (Signed)
[facility]        Patient presents to clinic today for annual exam.  Patient is fasting for labs.  Acute Concerns: None  Chronic Issues: Past Medical History:  Diagnosis Date   Allergy    Anxiety    Arthritis    Asthma    Blood transfusion without reported diagnosis    Depression    GERD (gastroesophageal reflux disease)    High cholesterol    Hypertension    Obesity    Osteoporosis    Recurrent boils    Seizures (HCC)    last seizure "3 years ago" per pt 06-16-16 KBW   Sleep apnea    no CPAP used   Uterine fibroid      Health Maintenance: Immunizations --up-to-date Colon Cancer Screening --06/30/2016 due in 10 years Mammogram --05/09/2022 due in 2 years PAP --09/28/2022 Bone Density --ordered  HIV/Hep C Screening completed negative  Past Medical History:  Diagnosis Date   Allergy    Anxiety    Arthritis    Asthma    Blood transfusion without reported diagnosis    Depression    GERD (gastroesophageal reflux disease)    High cholesterol    Hypertension    Obesity    Osteoporosis    Recurrent boils    Seizures (HCC)    last seizure "3 years ago" per pt 06-16-16 KBW   Sleep apnea    no CPAP used   Uterine fibroid     Past Surgical History:  Procedure Laterality Date   ABDOMINAL HYSTERECTOMY     BREAST BIOPSY Right 06/28/2022   [place] RT BREAST BX W LOC DEV 1ST LESION IMG BX SPEC US GUIDE 06/28/2022 [facility] MAMMOGRAPHY   CESAREAN SECTION     KNEE SURGERY Right    x2   UPPER GASTROINTESTINAL ENDOSCOPY      Current Outpatient Medications on File Prior to Visit  Medication Sig Dispense Refill   AMITIZA 24 MCG capsule TAKE 1 CAPSULE (24 MCG TOTAL) BY MOUTH 2 (TWO) TIMES DAILY WITH A MEAL. 60 capsule 1   amLODipine (NORVASC) 5 MG tablet Take 1 tablet (5 mg total) by mouth daily. 90 tablet 1   aspirin EC 81 MG tablet Take 81 mg by mouth daily.     atorvastatin (LIPITOR) 20 MG tablet TAKE ONE TABLET BY MOUTH ONCE DAILY FOR CHOLESTEROL  90 tablet 1   benzonatate (TESSALON) 100 MG capsule Take 1 capsule (100 mg total) by mouth 2 (two) times daily as needed for cough. 20 capsule 0   budesonide-formoterol (SYMBICORT) 160-4.5 MCG/ACT inhaler Inhale 2 puffs into the lungs 2 (two) times daily. (Patient taking differently: Inhale 2 puffs into the lungs 2 (two) times daily as needed (wheezing, sob).) 1 Inhaler 1   celecoxib (CELEBREX) 100 MG capsule Take 1 capsule (100 mg total) by mouth 2 (two) times daily. 180 capsule 1   clonazePAM (KLONOPIN) 1 MG tablet Take 0.5 mg by mouth 2 (two) times daily as needed for anxiety.     cyclobenzaprine (FLEXERIL) 10 MG tablet TAKE 1 TABLET (10 MG TOTAL) BY MOUTH 3 (THREE) TIMES DAILY AS NEEDED FOR MUSCLE SPASMS. 90 tablet 1   dicyclomine (BENTYL) 20 MG tablet Take 1 tablet (20 mg total) by mouth 2 (two) times daily. 20 tablet 0   esomeprazole (NEXIUM) 40 MG capsule Take one capsule as needed for indigestion 90 capsule 1   fluticasone (FLONASE) 50 MCG/ACT nasal spray PLACE 2 SPRAYS INTO BOTH NOSTRILS DAILY.  16 g 1   gabapentin (NEURONTIN) 300 MG capsule Take 300 mg by mouth 3 (three) times daily.     hydrochlorothiazide (HYDRODIURIL) 25 MG tablet Take 1 tablet (25 mg total) by mouth daily. 90 tablet 3   hydroxychloroquine (PLAQUENIL) 200 MG tablet Take 400 mg by mouth daily.     hydroxypropyl methylcellulose / hypromellose (ISOPTO TEARS / GONIOVISC) 2.5 % ophthalmic solution Place 1 drop into both eyes 3 (three) times daily as needed for dry eyes. 15 mL 3   ipratropium-albuterol (DUONEB) 0.5-2.5 (3) MG/3ML SOLN Take 3 mLs by nebulization every 6 (six) hours as needed. (Patient taking differently: Take 3 mLs by nebulization every 6 (six) hours as needed (for SOB).) 360 mL 1   leflunomide (ARAVA) 20 MG tablet Take 20 mg by mouth daily.     lidocaine (LIDODERM) 5 % Place 1 patch onto the skin daily. Remove & Discard patch within 12 hours or as directed by MD 30 patch 0   methylPREDNISolone (MEDROL DOSEPAK)  4 MG TBPK tablet Take as direted 1 each 0   Misc. Devices (WALKER) MISC 1 Device by Does not apply route daily as needed. 1 each 0   oxyCODONE (ROXICODONE) 15 MG immediate release tablet Take 15 mg by mouth 4 (four) times daily as needed for pain.     permethrin (ELIMITE) 5 % cream Apply to affected area once 60 g 0   phenytoin (DILANTIN) 100 MG ER capsule Take 100 mg by mouth 3 (three) times daily.     traZODone (DESYREL) 50 MG tablet TAKE 1/2 TO 1 TABLET BY MOUTH AT BEDTIME AS NEEDED FOR SLEEP. 30 tablet 3   triamcinolone 0.1% oint-Eucerin equivalent cream 1:1 mixture Apply topically 3 (three) times daily. 463 g 3   triamcinolone cream (KENALOG) 0.1 % Apply 1 Application topically 3 (three) times daily. 453.6 g 1   VENTOLIN HFA 108 (90 Base) MCG/ACT inhaler INHALE 2 PUFFS BY MOUTH EVERY 6 HOURS AS NEEDED FOR SHORTNESS OF BREATH OR WHEEZING. 18 g 1   No current facility-administered medications on file prior to visit.    Allergies  Allergen Reactions   Hydrocodone-Acetaminophen Itching    Started itching.  Pt states that she can take this medication Other reaction(s): Other (See Comments) Pruritus   Lortab [Hydrocodone-Acetaminophen]     Started itching   Niacin Hives   Penicillin G Hives and Nausea And Vomiting    Did it involve swelling of the face/tongue/throat, SOB, or low BP? No Did it involve sudden or severe rash/hives, skin peeling, or any reaction on the inside of your mouth or nose? Yes Did you need to seek medical attention at a hospital or doctor's office? No When did it last happen?    Over 20 years Ago   If all above answers are "NO", may proceed with cephalosporin use.     Penicillins Itching    Family History  Problem Relation Age of Onset   Breast cancer Maternal Aunt        unsure how old of onset   Breast cancer Maternal Aunt        unsure how old of onset   Colon cancer Neg Hx    Esophageal cancer Neg Hx    Rectal cancer Neg Hx    Stomach cancer Neg Hx      Social History   Socioeconomic History   Marital status: Single    Spouse name: Not on file   Number of children: Not on file  Years of education: Not on file   Highest education level: Not on file  Occupational History   Not on file  Tobacco Use   Smoking status: Never   Smokeless tobacco: Never  Vaping Use   Vaping Use: Never used  Substance and Sexual Activity   Alcohol use: No   Drug use: No    Types: "Crack" cocaine    Comment: last smoked 2011   Sexual activity: Not on file  Other Topics Concern   Not on file  Social History Narrative   Not on file   Social Determinants of Health   Financial Resource Strain: Not on file  Food Insecurity: Not on file  Transportation Needs: Not on file  Physical Activity: Not on file  Stress: Not on file  Social Connections: Not on file  Intimate Partner Violence: Not on file    ROS Comprehensive ROS Pertinent positive and negative noted in HPI   Blood Pressure (Abnormal) 140/82   Pulse 100   Respiration 16   Oxygen Saturation 97%    Physical exam: General: Vital signs reviewed.  Patient is well-developed and well-nourished, xx in no acute distress and cooperative with exam. Head: Normocephalic and atraumatic. Eyes: EOMI, conjunctivae normal, no scleral icterus. Neck: Supple, trachea midline, normal ROM, no JVD, masses, thyromegaly, or carotid bruit present. Cardiovascular: RRR, S1 normal, S2 normal, no murmurs, gallops, or rubs. Pulmonary/Chest: Clear to auscultation bilaterally, no wheezes, rales, or rhonchi. Abdominal: Soft, non-tender, non-distended, BS +, no masses, organomegaly, or guarding present. Musculoskeletal: No joint deformities, erythema, or stiffness, ROM full and nontender. Extremities: No lower extremity edema bilaterally,  pulses symmetric and intact bilaterally. No cyanosis or clubbing. Neurological: A&O x3, Strength is normal Skin: Warm, dry and intact. No rashes or erythema. Psychiatric: Normal  mood and affect. speech and behavior is normal. Cognition and memory are normal.    Recent Results (from the past 2160 hour(s))  Comprehensive metabolic panel     Status: Abnormal   Collection Time: 07/31/22 10:04 AM  Result Value Ref Range   Sodium 136 135 - 145 mmol/L   Potassium 3.7 3.5 - 5.1 mmol/L    Comment: HEMOLYSIS AT THIS LEVEL MAY AFFECT RESULT   Chloride 101 98 - 111 mmol/L   CO2 27 22 - 32 mmol/L   Glucose, Bld 107 (H) 70 - 99 mg/dL    Comment: Glucose reference range applies only to samples taken after fasting for at least 8 hours.   BUN 10 6 - 20 mg/dL   Creatinine, Ser 1.61 0.44 - 1.00 mg/dL   Calcium 8.9 8.9 - 09.6 mg/dL   Total Protein 6.2 (L) 6.5 - 8.1 g/dL   Albumin 3.3 (L) 3.5 - 5.0 g/dL   AST 52 (H) 15 - 41 U/L    Comment: HEMOLYSIS AT THIS LEVEL MAY AFFECT RESULT   ALT 47 (H) 0 - 44 U/L    Comment: HEMOLYSIS AT THIS LEVEL MAY AFFECT RESULT   Alkaline Phosphatase 113 38 - 126 U/L   Total Bilirubin 0.4 0.3 - 1.2 mg/dL    Comment: HEMOLYSIS AT THIS LEVEL MAY AFFECT RESULT   GFR, Estimated >60 >60 mL/min    Comment: (NOTE) Calculated using the CKD-EPI Creatinine Equation (2021)    Anion gap 8 5 - 15    Comment: Performed at [facility], [address], [place], [place] [address]  Lipase, blood     Status: None   Collection Time: 07/31/22 10:04 AM  Result Value  Ref Range   Lipase 31 11 - 51 U/L    Comment: Performed at [facility], [address], [place], [place] [address]  CBC with Diff     Status: Abnormal   Collection Time: 07/31/22 10:04 AM  Result Value Ref Range   WBC 4.2 4.0 - 10.5 K/uL   RBC 3.70 (L) 3.87 - 5.11 MIL/uL   Hemoglobin 12.1 12.0 - 15.0 g/dL   HCT 21.3 (L) 08.6 - 57.8 %   MCV 94.6 80.0 - 100.0 fL   MCH 32.7 26.0 - 34.0 pg   MCHC 34.6 30.0 - 36.0 g/dL   RDW 46.9 62.9 - 52.8 %   Platelets 153 150 - 400 K/uL   nRBC 0.0 0.0 - 0.2 %   Neutrophils Relative % 42 %   Neutro Abs 1.8 1.7 - 7.7 K/uL   Lymphocytes Relative 42  %   Lymphs Abs 1.8 0.7 - 4.0 K/uL   Monocytes Relative 10 %   Monocytes Absolute 0.4 0.1 - 1.0 K/uL   Eosinophils Relative 4 %   Eosinophils Absolute 0.2 0.0 - 0.5 K/uL   Basophils Relative 1 %   Basophils Absolute 0.0 0.0 - 0.1 K/uL   Immature Granulocytes 1 %   Abs Immature Granulocytes 0.02 0.00 - 0.07 K/uL    Comment: Performed at [facility], [address] [place], [place] [address]  Urinalysis, Routine w reflex microscopic -Urine, Clean Catch     Status: Abnormal   Collection Time: 07/31/22 10:04 AM  Result Value Ref Range   Color, Urine AMBER (A) YELLOW    Comment: BIOCHEMICALS MAY BE AFFECTED BY COLOR   APPearance CLOUDY (A) CLEAR   Specific Gravity, Urine 1.021 1.005 - 1.030   pH 5.0 5.0 - 8.0   Glucose, UA NEGATIVE NEGATIVE mg/dL   Hgb urine dipstick NEGATIVE NEGATIVE   Bilirubin Urine NEGATIVE NEGATIVE   Ketones, ur NEGATIVE NEGATIVE mg/dL   Protein, ur 30 (A) NEGATIVE mg/dL   Nitrite NEGATIVE NEGATIVE   Leukocytes,Ua NEGATIVE NEGATIVE   RBC / HPF 0-5 0 - 5 RBC/hpf   WBC, UA 0-5 0 - 5 WBC/hpf   Bacteria, UA MANY (A) NONE SEEN   Squamous Epithelial / HPF 21-50 0 - 5 /HPF   Mucus PRESENT    Hyaline Casts, UA PRESENT     Comment: Performed at [facility], [address], [place], [place] [address]  Resp panel by RT-PCR (RSV, Flu A&B, Covid) Anterior Nasal Swab     Status: None   Collection Time: 07/31/22 11:49 AM   Specimen: Anterior Nasal Swab  Result Value Ref Range   SARS Coronavirus 2 by RT PCR NEGATIVE NEGATIVE   Influenza A by PCR NEGATIVE NEGATIVE   Influenza B by PCR NEGATIVE NEGATIVE    Comment: (NOTE) The Xpert Xpress SARS-CoV-2/FLU/RSV plus assay is intended as an aid in the diagnosis of influenza from Nasopharyngeal swab specimens and should not be used as a sole basis for treatment. Nasal washings and aspirates are unacceptable for Xpert Xpress SARS-CoV-2/FLU/RSV testing.  Fact Sheet for  Patients: BloggerCourse.com  Fact Sheet for Healthcare Providers: SeriousBroker.it  This test is not yet approved or cleared by the [place] FDA and has been authorized for detection and/or diagnosis of SARS-CoV-2 by FDA under an Emergency Use Authorization (EUA). This EUA will remain in effect (meaning this test can be used) for the duration of the COVID-19 declaration under Section 564(b)(1) of the Act, 21  U.S.C. section 360bbb-3(b)(1), unless the authorization is terminated or revoked.     Resp Syncytial Virus by PCR NEGATIVE NEGATIVE    Comment: (NOTE) Fact Sheet for Patients: BloggerCourse.com  Fact Sheet for Healthcare Providers: SeriousBroker.it  This test is not yet approved or cleared by the [place] FDA and has been authorized for detection and/or diagnosis of SARS-CoV-2 by FDA under an Emergency Use Authorization (EUA). This EUA will remain in effect (meaning this test can be used) for the duration of the COVID-19 declaration under Section 564(b)(1) of the Act, 21 U.S.C. section 360bbb-3(b)(1), unless the authorization is terminated or revoked.  Performed at [facility], [address], [place], [place] [address]   Cervicovaginal ancillary only     Status: None   Collection Time: 09/28/22 12:36 PM  Result Value Ref Range   Neisseria Gonorrhea Negative    Chlamydia Negative    Trichomonas Negative    Bacterial Vaginitis (gardnerella) Negative    Candida Vaginitis Negative    Candida Glabrata Negative    Comment      Normal Reference Range Bacterial Vaginosis - Negative   Comment Normal Reference Range Candida Species - Negative    Comment Normal Reference Range Candida Galbrata - Negative    Comment Normal Reference Range Trichomonas - Negative    Comment Normal Reference Ranger Chlamydia - Negative    Comment      Normal Reference Range  Neisseria Gonorrhea - Negative  Cytology - PAP([place])     Status: None   Collection Time: 09/28/22 12:36 PM  Result Value Ref Range   High risk HPV Negative    Neisseria Gonorrhea Negative    Chlamydia Negative    Trichomonas Negative    Adequacy Satisfactory for evaluation.    Diagnosis      - Negative for intraepithelial lesion or malignancy (NILM)   Comment Normal Reference Range HPV - Negative    Comment Normal Reference Range Trichomonas - Negative    Comment Normal Reference Ranger Chlamydia - Negative    Comment      Normal Reference Range Neisseria Gonorrhea - Negative    Assessment/Plan: Heather Perry was seen today for annual exam.  Diagnoses and all orders for this visit:  Medication management -     CMP14+EGFR -     CBC with Differential  Essential hypertension BP goal - < 130/80 Explained that having normal blood pressure is the goal and medications are helping to get to goal and maintain normal blood pressure. DIET: Limit salt intake, read nutrition labels to check salt content, limit fried and high fatty foods  Avoid using multisymptom OTC cold preparations that generally contain sudafed which can rise BP. Consult with pharmacist on best cold relief products to use for persons with HTN EXERCISE Discussed incorporating exercise such as walking - 30 minutes most days of the week and can do in 10 minute intervals     Morbid obesity (HCC) Discussed diet and exercise for person with BMI >25. Instructed: You must burn more calories than you eat. Losing 5 percent of your body weight should be considered a success. In the longer term, losing more than 15 percent of your body weight and staying at this weight is an extremely good result. However, keep in mind that even losing 5 percent of your body weight leads to important health benefits, so try not to get discouraged if you're not able to lose more than this. Will recheck weight in 3-6 months.  Hyperlipidemia with  target LDL less than 100  Healthy lifestyle diet of fruits vegetables fish nuts whole grains and low saturated fat . Foods high in cholesterol or liver, fatty meats,cheese, butter avocados, nuts and seeds, chocolate and fried foods.    -     Lipid Panel  Estrogen deficiency -     DG Bone Density; Future    This note has been created with Education officer, environmental. Any transcriptional errors are unintentional.   Grayce Sessions, NP 10/28/2022, 11:53 AM    This visit occurred during the SARS-CoV-2 public health emergency.  Safety protocols were in place, including screening questions prior to the visit, additional usage of staff PPE, and extensive cleaning of exam room while observing appropriate contact time as indicated for disinfecting solutions.    Grayce Sessions, NP

## 2022-10-29 LAB — CMP14+EGFR
ALT: 50 IU/L — ABNORMAL HIGH (ref 0–32)
AST: 61 IU/L — ABNORMAL HIGH (ref 0–40)
Albumin/Globulin Ratio: 1.6 (ref 1.2–2.2)
Albumin: 4.3 g/dL (ref 3.8–4.9)
Alkaline Phosphatase: 178 IU/L — ABNORMAL HIGH (ref 44–121)
BUN/Creatinine Ratio: 16 (ref 9–23)
BUN: 10 mg/dL (ref 6–24)
Bilirubin Total: 0.6 mg/dL (ref 0.0–1.2)
CO2: 24 mmol/L (ref 20–29)
Calcium: 9.3 mg/dL (ref 8.7–10.2)
Chloride: 97 mmol/L (ref 96–106)
Creatinine, Ser: 0.63 mg/dL (ref 0.57–1.00)
Globulin, Total: 2.7 g/dL (ref 1.5–4.5)
Glucose: 112 mg/dL — ABNORMAL HIGH (ref 70–99)
Potassium: 3.6 mmol/L (ref 3.5–5.2)
Sodium: 137 mmol/L (ref 134–144)
Total Protein: 7 g/dL (ref 6.0–8.5)
eGFR: 103 mL/min/{1.73_m2} (ref >59)

## 2022-10-29 LAB — LIPID PANEL
Chol/HDL Ratio: 2.2 ratio (ref 0.0–4.4)
Cholesterol, Total: 213 mg/dL — ABNORMAL HIGH (ref 100–199)
HDL: 97 mg/dL (ref >39)
LDL Chol Calc (NIH): 90 mg/dL (ref 0–99)
Triglycerides: 158 mg/dL — ABNORMAL HIGH (ref 0–149)
VLDL Cholesterol Cal: 26 mg/dL (ref 5–40)

## 2022-10-29 LAB — CBC WITH DIFFERENTIAL/PLATELET
Basophils Absolute: 0 10*3/uL (ref 0.0–0.2)
Basos: 1 %
EOS (ABSOLUTE): 0.3 10*3/uL (ref 0.0–0.4)
Eos: 6 %
Hematocrit: 38.5 % (ref 34.0–46.6)
Hemoglobin: 12.8 g/dL (ref 11.1–15.9)
Immature Grans (Abs): 0 10*3/uL (ref 0.0–0.1)
Immature Granulocytes: 0 %
Lymphocytes Absolute: 1.5 10*3/uL (ref 0.7–3.1)
Lymphs: 36 %
MCH: 29.6 pg (ref 26.6–33.0)
MCHC: 33.2 g/dL (ref 31.5–35.7)
MCV: 89 fL (ref 79–97)
Monocytes Absolute: 0.4 10*3/uL (ref 0.1–0.9)
Monocytes: 10 %
Neutrophils Absolute: 2 10*3/uL (ref 1.4–7.0)
Neutrophils: 47 %
Platelets: 136 10*3/uL — ABNORMAL LOW (ref 150–450)
RBC: 4.32 x10E6/uL (ref 3.77–5.28)
RDW: 13.8 % (ref 11.7–15.4)
WBC: 4.1 10*3/uL (ref 3.4–10.8)

## 2022-11-03 ENCOUNTER — Telehealth (INDEPENDENT_AMBULATORY_CARE_PROVIDER_SITE_OTHER): Payer: Self-pay

## 2022-11-03 NOTE — Telephone Encounter (Signed)
Contacted pt to go over lab results pt is aware  Pt states she is taking ASA everyday. Pt doesn't have any questions or concerns

## 2022-11-08 ENCOUNTER — Telehealth: Payer: Self-pay | Admitting: Primary Care

## 2022-11-08 NOTE — Telephone Encounter (Signed)
Copied from CRM [id number]. Topic: General - Other >> Nov 08, 2022 10:42 AM Everette C wrote: Reason for CRM: The patient would like to be contacted by a member of staff when possible to review their recent labs  Please contact further when available

## 2022-11-08 NOTE — Telephone Encounter (Signed)
Returned pt call and made aware of results on 5/16. Went over lab results again pt doesn't have any questions or concerns

## 2022-12-06 ENCOUNTER — Emergency Department (HOSPITAL_COMMUNITY): Payer: Medicaid Other

## 2022-12-06 ENCOUNTER — Encounter (HOSPITAL_COMMUNITY): Payer: Self-pay

## 2022-12-06 ENCOUNTER — Emergency Department (HOSPITAL_COMMUNITY)
Admission: EM | Admit: 2022-12-06 | Discharge: 2022-12-06 | Disposition: A | Discharge status: Home / Self Care | Payer: Medicaid Other | Attending: Emergency Medicine | Admitting: Emergency Medicine

## 2022-12-06 ENCOUNTER — Other Ambulatory Visit: Payer: Self-pay

## 2022-12-06 DIAGNOSIS — K2900 Acute gastritis without bleeding: Secondary | ICD-10-CM | POA: Diagnosis not present

## 2022-12-06 DIAGNOSIS — K2100 Gastro-esophageal reflux disease with esophagitis, without bleeding: Secondary | ICD-10-CM | POA: Diagnosis not present

## 2022-12-06 DIAGNOSIS — I10 Essential (primary) hypertension: Secondary | ICD-10-CM | POA: Insufficient documentation

## 2022-12-06 DIAGNOSIS — R079 Chest pain, unspecified: Secondary | ICD-10-CM | POA: Diagnosis present

## 2022-12-06 LAB — CBC WITH DIFFERENTIAL/PLATELET
Abs Immature Granulocytes: 0.01 10*3/uL (ref 0.00–0.07)
Basophils Absolute: 0 10*3/uL (ref 0.0–0.1)
Basophils Relative: 1 %
Eosinophils Absolute: 0.2 10*3/uL (ref 0.0–0.5)
Eosinophils Relative: 6 %
HCT: 40.2 % (ref 36.0–46.0)
Hemoglobin: 13.6 g/dL (ref 12.0–15.0)
Immature Granulocytes: 0 %
Lymphocytes Relative: 30 %
Lymphs Abs: 1.1 10*3/uL (ref 0.7–4.0)
MCH: 30.7 pg (ref 26.0–34.0)
MCHC: 33.8 g/dL (ref 30.0–36.0)
MCV: 90.7 fL (ref 80.0–100.0)
Monocytes Absolute: 0.3 10*3/uL (ref 0.1–1.0)
Monocytes Relative: 9 %
Neutro Abs: 2 10*3/uL (ref 1.7–7.7)
Neutrophils Relative %: 54 %
Platelets: 145 10*3/uL — ABNORMAL LOW (ref 150–400)
RBC: 4.43 MIL/uL (ref 3.87–5.11)
RDW: 14.2 % (ref 11.5–15.5)
WBC: 3.7 10*3/uL — ABNORMAL LOW (ref 4.0–10.5)
nRBC: 0 % (ref 0.0–0.2)

## 2022-12-06 LAB — LIPASE, BLOOD: Lipase: 29 U/L (ref 11–51)

## 2022-12-06 LAB — COMPREHENSIVE METABOLIC PANEL
ALT: 36 U/L (ref 0–44)
AST: 55 U/L — ABNORMAL HIGH (ref 15–41)
Albumin: 3.8 g/dL (ref 3.5–5.0)
Alkaline Phosphatase: 140 U/L — ABNORMAL HIGH (ref 38–126)
Anion gap: 12 (ref 5–15)
BUN: 6 mg/dL (ref 6–20)
CO2: 28 mmol/L (ref 22–32)
Calcium: 9.3 mg/dL (ref 8.9–10.3)
Chloride: 100 mmol/L (ref 98–111)
Creatinine, Ser: 0.69 mg/dL (ref 0.44–1.00)
GFR, Estimated: >60 mL/min (ref >60)
Glucose, Bld: 123 mg/dL — ABNORMAL HIGH (ref 70–99)
Potassium: 3.5 mmol/L (ref 3.5–5.1)
Sodium: 140 mmol/L (ref 135–145)
Total Bilirubin: 0.7 mg/dL (ref 0.3–1.2)
Total Protein: 7.2 g/dL (ref 6.5–8.1)

## 2022-12-06 LAB — MAGNESIUM: Magnesium: 2 mg/dL (ref 1.7–2.4)

## 2022-12-06 LAB — TROPONIN I (HIGH SENSITIVITY): Troponin I (High Sensitivity): 5 ng/L (ref <18)

## 2022-12-06 MED ORDER — NITROGLYCERIN 2 % TD OINT
1.0000 [in_us] | TOPICAL_OINTMENT | Freq: Once | TRANSDERMAL | Status: AC
  Administered 2022-12-06: 1 [in_us] via TOPICAL
  Filled 2022-12-06: qty 1

## 2022-12-06 MED ORDER — FENTANYL CITRATE PF 50 MCG/ML IJ SOSY
50.0000 ug | PREFILLED_SYRINGE | Freq: Once | INTRAMUSCULAR | Status: AC
  Administered 2022-12-06: 50 ug via INTRAVENOUS
  Filled 2022-12-06: qty 1

## 2022-12-06 MED ORDER — FAMOTIDINE IN NACL 20-0.9 MG/50ML-% IV SOLN
20.0000 mg | Freq: Once | INTRAVENOUS | Status: AC
  Administered 2022-12-06: 20 mg via INTRAVENOUS
  Filled 2022-12-06: qty 50

## 2022-12-06 MED ORDER — LIDOCAINE VISCOUS HCL 2 % MT SOLN
15.0000 mL | Freq: Once | OROMUCOSAL | Status: AC
  Administered 2022-12-06: 15 mL via ORAL
  Filled 2022-12-06: qty 15

## 2022-12-06 MED ORDER — IOHEXOL 350 MG/ML SOLN
100.0000 mL | Freq: Once | INTRAVENOUS | Status: AC | PRN
  Administered 2022-12-06: 100 mL via INTRAVENOUS

## 2022-12-06 MED ORDER — OXYCODONE HCL 5 MG PO TABS
15.0000 mg | ORAL_TABLET | Freq: Once | ORAL | Status: AC
  Administered 2022-12-06: 15 mg via ORAL
  Filled 2022-12-06: qty 3

## 2022-12-06 MED ORDER — SUCRALFATE 1 GM/10ML PO SUSP: 1.0000 g | Freq: Three times a day (TID) | ORAL | 1 refills | Status: DC

## 2022-12-06 MED ORDER — PANTOPRAZOLE SODIUM 40 MG PO TBEC
40.0000 mg | DELAYED_RELEASE_TABLET | Freq: Once | ORAL | Status: AC
  Administered 2022-12-06: 40 mg via ORAL
  Filled 2022-12-06: qty 1

## 2022-12-06 MED ORDER — ALUM & MAG HYDROXIDE-SIMETH 200-200-20 MG/5ML PO SUSP
30.0000 mL | Freq: Once | ORAL | Status: AC
  Administered 2022-12-06: 30 mL via ORAL
  Filled 2022-12-06: qty 30

## 2022-12-06 NOTE — ED Triage Notes (Signed)
Pt c/o central chest pain x5 days.  Pain score 6/10.  Pt reports pain increases w/ eating.  Pt reports nausea and arm pain, but only during the first 1-2 days.

## 2022-12-06 NOTE — Discharge Instructions (Signed)
Continue taking your Nexium daily.  This will promote stomach and esophagus healing.  There was another medication that was prescribed today called Carafate.  Take this with meals.  Avoid alcohol, tobacco, and NSAID medications.  Call the number below to set up a follow-up appointment with a gastroenterologist.  Return to the emergency department for any new or worsening symptoms of concern.

## 2022-12-06 NOTE — ED Provider Notes (Signed)
EMERGENCY DEPARTMENT AT [facility] Provider Note   CSN: [id number] Arrival date & time: 12/06/22  1039     History  Chief Complaint  Patient presents with   Chest Pain    Heather Perry is a 57 y.o. female.  HPI Patient presents for chest pain.  Medical history includes HTN, HLD, anxiety, depression, GERD, seizures, sleep apnea, arthritis.  Onset of chest pain was 5 days ago.  It is lower sternal/epigastric in location.  Patient has been taking Pepto-Bismol and Tums at home with some relief.  Pain does seem to worsen after eating.  She initially had some nausea and vomiting but this resolved after the first 2 days.  Pain worsened in severity this morning.  She took some Pepto-Bismol.  Current pain is 6/10 in severity.  On prior days, she had some discomfort in her right arm.  She took Robaxin for this.  Currently, pain does not radiate.    Home Medications Prior to Admission medications   Medication Sig Start Date End Date Taking? Authorizing Provider  sucralfate (CARAFATE) 1 GM/10ML suspension Take 10 mLs (1 g total) by mouth with breakfast, with lunch, and with evening meal. 12/06/22  Yes Gloris Manchester, MD  AMITIZA 24 MCG capsule TAKE 1 CAPSULE (24 MCG TOTAL) BY MOUTH 2 (TWO) TIMES DAILY WITH A MEAL. 03/11/21   Raulkar, Drema Pry, MD  amLODipine (NORVASC) 5 MG tablet Take 1 tablet (5 mg total) by mouth daily. 08/29/22   Grayce Sessions, NP  aspirin EC 81 MG tablet Take 81 mg by mouth daily.    [provider]  atorvastatin (LIPITOR) 20 MG tablet TAKE ONE TABLET BY MOUTH ONCE DAILY FOR CHOLESTEROL 08/29/22   Grayce Sessions, NP  benzonatate (TESSALON) 100 MG capsule Take 1 capsule (100 mg total) by mouth 2 (two) times daily as needed for cough. 10/12/22   Grayce Sessions, NP  budesonide-formoterol (SYMBICORT) 160-4.5 MCG/ACT inhaler Inhale 2 puffs into the lungs 2 (two) times daily. Patient taking differently: Inhale 2 puffs into the lungs 2 (two)  times daily as needed (wheezing, sob). 05/18/16   Henrietta Hoover, NP  celecoxib (CELEBREX) 100 MG capsule Take 1 capsule (100 mg total) by mouth 2 (two) times daily. 08/29/22   Grayce Sessions, NP  clonazePAM (KLONOPIN) 1 MG tablet Take 0.5 mg by mouth 2 (two) times daily as needed for anxiety. 12/15/21   [provider]  cyclobenzaprine (FLEXERIL) 10 MG tablet TAKE 1 TABLET (10 MG TOTAL) BY MOUTH 3 (THREE) TIMES DAILY AS NEEDED FOR MUSCLE SPASMS. 05/22/22   Grayce Sessions, NP  dicyclomine (BENTYL) 20 MG tablet Take 1 tablet (20 mg total) by mouth 2 (two) times daily. 07/31/22   Alphia Kava, MD  esomeprazole (NEXIUM) 40 MG capsule Take one capsule as needed for indigestion 08/29/22   Grayce Sessions, NP  fluticasone (FLONASE) 50 MCG/ACT nasal spray PLACE 2 SPRAYS INTO BOTH NOSTRILS DAILY. 09/22/22   Grayce Sessions, NP  gabapentin (NEURONTIN) 300 MG capsule Take 300 mg by mouth 3 (three) times daily. 12/24/21   [provider]  hydrochlorothiazide (HYDRODIURIL) 25 MG tablet Take 1 tablet (25 mg total) by mouth daily. 08/29/22   Grayce Sessions, NP  hydroxychloroquine (PLAQUENIL) 200 MG tablet Take 400 mg by mouth daily. 03/21/17   [provider]  hydroxypropyl methylcellulose / hypromellose (ISOPTO TEARS / GONIOVISC) 2.5 % ophthalmic solution Place 1 drop into both eyes 3 (three) times daily as  needed for dry eyes. 02/11/22   Grayce Sessions, NP  ipratropium-albuterol (DUONEB) 0.5-2.5 (3) MG/3ML SOLN Take 3 mLs by nebulization every 6 (six) hours as needed. Patient taking differently: Take 3 mLs by nebulization every 6 (six) hours as needed (for SOB). 05/18/16   Henrietta Hoover, NP  leflunomide (ARAVA) 20 MG tablet Take 20 mg by mouth daily. 11/11/19   [provider]  lidocaine (LIDODERM) 5 % Place 1 patch onto the skin daily. Remove & Discard patch within 12 hours or as directed by MD 04/05/22   Alvira Monday, MD  methylPREDNISolone  (MEDROL DOSEPAK) 4 MG TBPK tablet Take as direted 06/03/22   Jannifer Franklin, MD  Misc. Devices (WALKER) MISC 1 Device by Does not apply route daily as needed. 07/03/21   Rhys Martini, PA-C  oxyCODONE (ROXICODONE) 15 MG immediate release tablet Take 15 mg by mouth 4 (four) times daily as needed for pain. 12/29/21   [provider]  permethrin (ELIMITE) 5 % cream Apply to affected area once 06/18/22   Fayrene Helper, PA-C  phenytoin (DILANTIN) 100 MG ER capsule Take 100 mg by mouth 3 (three) times daily.    [provider]  traZODone (DESYREL) 50 MG tablet TAKE 1/2 TO 1 TABLET BY MOUTH AT BEDTIME AS NEEDED FOR SLEEP. 09/14/22   Grayce Sessions, NP  triamcinolone 0.1% oint-Eucerin equivalent cream 1:1 mixture Apply topically 3 (three) times daily. 05/18/22   Grayce Sessions, NP  triamcinolone cream (KENALOG) 0.1 % Apply 1 Application topically 3 (three) times daily. 05/18/22   Grayce Sessions, NP  VENTOLIN HFA 108 (90 Base) MCG/ACT inhaler INHALE 2 PUFFS BY MOUTH EVERY 6 HOURS AS NEEDED FOR SHORTNESS OF BREATH OR WHEEZING. 09/05/22   Grayce Sessions, NP      Allergies    Hydrocodone-acetaminophen, Lortab [hydrocodone-acetaminophen], Niacin, Penicillin g, and Penicillins    Review of Systems   Review of Systems  Cardiovascular:  Positive for chest pain.  Gastrointestinal:  Positive for nausea and vomiting.  All other systems reviewed and are negative.   Physical Exam Updated Vital Signs BP (!) 147/98   Pulse (!) 111   Temp 98.1 F (36.7 C) (Oral)   Resp (!) 22   Ht 5\' 2"  (1.575 m)   Wt 95.7 kg   SpO2 98%   BMI 38.59 kg/m  Physical Exam Vitals and nursing note reviewed.  Constitutional:      General: She is not in acute distress.    Appearance: She is well-developed. She is not ill-appearing, toxic-appearing or diaphoretic.  HENT:     Head: Normocephalic and atraumatic.  Eyes:     Conjunctiva/sclera: Conjunctivae normal.  Cardiovascular:     Rate and  Rhythm: Normal rate and regular rhythm.     Heart sounds: No murmur heard. Pulmonary:     Effort: Pulmonary effort is normal. No respiratory distress.     Breath sounds: Normal breath sounds. No decreased breath sounds, wheezing, rhonchi or rales.  Chest:     Chest wall: Tenderness present.  Abdominal:     Palpations: Abdomen is soft.     Tenderness: There is abdominal tenderness.  Musculoskeletal:        General: No swelling.     Cervical back: Neck supple.     Right lower leg: No edema.     Left lower leg: No edema.  Skin:    General: Skin is warm and dry.     Coloration: Skin is not  cyanotic or pale.  Neurological:     General: No focal deficit present.     Mental Status: She is alert and oriented to person, place, and time.  Psychiatric:        Mood and Affect: Mood normal.        Behavior: Behavior normal.     ED Results / Procedures / Treatments   Labs (all labs ordered are listed, but only abnormal results are displayed) Labs Reviewed  COMPREHENSIVE METABOLIC PANEL - Abnormal; Notable for the following components:      Result Value   Glucose, Bld 123 (*)    AST 55 (*)    Alkaline Phosphatase 140 (*)    All other components within normal limits  CBC WITH DIFFERENTIAL/PLATELET - Abnormal; Notable for the following components:   WBC 3.7 (*)    Platelets 145 (*)    All other components within normal limits  MAGNESIUM  LIPASE, BLOOD  TROPONIN I (HIGH SENSITIVITY)  TROPONIN I (HIGH SENSITIVITY)    EKG EKG Interpretation  Date/Time:  Tuesday December 06 2022 10:47:28 EDT Ventricular Rate:  107 PR Interval:  148 QRS Duration: 70 QT Interval:  333 QTC Calculation: 445 R Axis:   42 Text Interpretation: Sinus tachycardia Low voltage, precordial leads Confirmed by Gloris Manchester ([id number]) on 12/06/2022 12:01:40 PM  Radiology CT Angio Chest/Abd/Pel for Dissection W and/or Wo Contrast  Result Date: 12/06/2022 CLINICAL DATA:  Acute aortic syndrome suspected, chest pain EXAM:  CT ANGIOGRAPHY CHEST, ABDOMEN AND PELVIS TECHNIQUE: Non-contrast CT of the chest was initially obtained. Multidetector CT imaging through the chest, abdomen and pelvis was performed using the standard protocol during bolus administration of intravenous contrast. Multiplanar reconstructed images and MIPs were obtained and reviewed to evaluate the vascular anatomy. RADIATION DOSE REDUCTION: This exam was performed according to the departmental dose-optimization program which includes automated exposure control, adjustment of the mA and/or kV according to patient size and/or use of iterative reconstruction technique. CONTRAST:  OMNIPAQUE IOHEXOL 350 MG/ML SOLN COMPARISON:  CT abdomen pelvis, 07/31/2022 FINDINGS: CTA CHEST FINDINGS VASCULAR Aorta: Satisfactory opacification of the aorta. Normal contour and caliber of the thoracic aorta. No evidence of aneurysm, dissection, or other acute aortic pathology. Cardiovascular: No evidence of pulmonary embolism on limited non-tailored examination. Normal heart size. No pericardial effusion. Review of the MIP images confirms the above findings. NON VASCULAR Mediastinum/Nodes: No enlarged mediastinal, hilar, or axillary lymph nodes. Circumferential wall thickening of the lower esophagus (series 6, image 107). Thyroid gland and trachea demonstrate no significant findings. Lungs/Pleura: Lungs are clear. No pleural effusion or pneumothorax. Musculoskeletal: No chest wall abnormality. No acute osseous findings. Review of the MIP images confirms the above findings. CTA ABDOMEN AND PELVIS FINDINGS VASCULAR Normal contour and caliber of the abdominal aorta. No evidence of aneurysm, dissection, or other acute aortic pathology. Standard branching pattern of the abdominal aorta with solitary bilateral renal arteries. Review of the MIP images confirms the above findings. NON-VASCULAR Hepatobiliary: No solid liver abnormality is seen. Hepatic steatosis. Coarse contour of the liver. No  gallstones, gallbladder wall thickening, or biliary dilatation. Pancreas: Unremarkable. No pancreatic ductal dilatation or surrounding inflammatory changes. Spleen: Normal in size without significant abnormality. Adrenals/Urinary Tract: Adrenal glands are unremarkable. Kidneys are normal, without renal calculi, solid lesion, or hydronephrosis. Bladder is unremarkable. Stomach/Bowel: Small diverticulum of the gastric fundus. Stomach is otherwise within normal limits. Appendix appears normal. No evidence of bowel wall thickening, distention, or inflammatory changes. Sigmoid diverticulosis. Lymphatic: No enlarged abdominal or  pelvic lymph nodes. Reproductive: Status post hysterectomy. Other: No abdominal wall hernia or abnormality. No ascites. Musculoskeletal: No acute osseous findings. IMPRESSION: 1. Normal contour and caliber of the thoracic and abdominal aorta. No evidence of aneurysm, dissection, or other acute aortic pathology. No significant atherosclerosis. 2. Circumferential wall thickening of the lower esophagus, suggestive of reflux esophagitis. 3. Hepatic steatosis. Coarse contour of the liver, suggestive of cirrhosis. 4. Sigmoid diverticulosis without evidence of acute diverticulitis. 5. Status post hysterectomy. Electronically Signed   By: Jearld Lesch M.D.   On: 12/06/2022 14:45   DG Chest Portable 1 View  Result Date: 12/06/2022 CLINICAL DATA:  Chest pain for 5 days. EXAM: PORTABLE CHEST 1 VIEW COMPARISON:  Chest radiograph 06/03/2022 FINDINGS: The cardiomediastinal silhouette is normal There is no focal consolidation or pulmonary edema. There is no pleural effusion or pneumothorax There is no acute osseous abnormality. IMPRESSION: No radiographic evidence of acute cardiopulmonary process. Electronically Signed   By: Lesia Hausen M.D.   On: 12/06/2022 12:26    Procedures Procedures    Medications Ordered in ED Medications  nitroGLYCERIN (NITROGLYN) 2 % ointment 1 inch (1 inch Topical Given  12/06/22 1106)  fentaNYL (SUBLIMAZE) injection 50 mcg (50 mcg Intravenous Given 12/06/22 1120)  alum & mag hydroxide-simeth (MAALOX/MYLANTA) 200-200-20 MG/5ML suspension 30 mL (30 mLs Oral Given 12/06/22 1108)    And  lidocaine (XYLOCAINE) 2 % viscous mouth solution 15 mL (15 mLs Oral Given 12/06/22 1108)  famotidine (PEPCID) IVPB 20 mg premix (0 mg Intravenous Stopped 12/06/22 1148)  fentaNYL (SUBLIMAZE) injection 50 mcg (50 mcg Intravenous Given 12/06/22 1416)  iohexol (OMNIPAQUE) 350 MG/ML injection 100 mL (100 mLs Intravenous Contrast Given 12/06/22 1402)  oxyCODONE (Oxy IR/ROXICODONE) immediate release tablet 15 mg (15 mg Oral Given 12/06/22 1620)  pantoprazole (PROTONIX) EC tablet 40 mg (40 mg Oral Given 12/06/22 1620)    ED Course/ Medical Decision Making/ A&P                             Medical Decision Making Amount and/or Complexity of Data Reviewed Labs: ordered. Radiology: ordered.  Risk OTC drugs. Prescription drug management.   This patient presents to the ED for concern of chest pain, this involves an extensive number of treatment options, and is a complaint that carries with it a high risk of complications and morbidity.  The differential diagnosis includes ACS, PUD, GERD, pancreatitis, aortic dissection, pericarditis, PE, pneumonia   Co morbidities that complicate the patient evaluation  HTN, HLD, anxiety, depression, GERD, seizures, sleep apnea, arthritis   Additional history obtained:  Additional history obtained from N/A External records from outside source obtained and reviewed including EMR   Lab Tests:  I Ordered, and personally interpreted labs.  The pertinent results include: Normal hemoglobin, baseline leukopenia, normal troponin, normal kidney function, normal electrolytes   Imaging Studies ordered:  I ordered imaging studies including chest x-ray, CTA chest I independently visualized and interpreted imaging which showed evidence of reflux esophagitis  without other acute findings. I agree with the radiologist interpretation   Cardiac Monitoring: / EKG:  The patient was maintained on a cardiac monitor.  I personally viewed and interpreted the cardiac monitored which showed an underlying rhythm of: Sinus rhythm  Problem List / ED Course / Critical interventions / Medication management  Patient presents for lower sternal/epigastric pain that has been intermittent over the past 5 days and has worsened in severity this morning.  Pain is  worsened postprandially.  Although symptoms are descriptive of PUD/reflux, patient to undergo cardiac workup here in the ED.  EKG does not show STEMI.  GI cocktail was ordered for empiric treatment of GI etiology.  Patient did have improved symptoms after this.  She did continue to endorse chest pain and fentanyl was given.  Lab work shows normal lipase, normal troponin, normal electrolytes.  Imaging studies are notable for evidence of reflux esophagitis.  This is consistent with her symptoms.  Patient is currently prescribed Nexium.  She was advised to continue this.  Carafate was prescribed.  She was advised to follow-up with gastroenterology.  She was discharged in stable condition. I ordered medication including GI cocktail, fentanyl, Percocet for analgesia Reevaluation of the patient after these medicines showed that the patient improved I have reviewed the patients home medicines and have made adjustments as needed   Social Determinants of Health:  Has access to outpatient care         Final Clinical Impression(s) / ED Diagnoses Final diagnoses:  Acute gastritis without hemorrhage, unspecified gastritis type  Gastroesophageal reflux disease with esophagitis without hemorrhage    Rx / DC Orders ED Discharge Orders          Ordered    sucralfate (CARAFATE) 1 GM/10ML suspension  3 times daily with meals        12/06/22 1602              Gloris Manchester, MD 12/06/22 1812

## 2022-12-13 ENCOUNTER — Other Ambulatory Visit (INDEPENDENT_AMBULATORY_CARE_PROVIDER_SITE_OTHER): Payer: Self-pay | Admitting: Primary Care

## 2022-12-13 DIAGNOSIS — J45909 Unspecified asthma, uncomplicated: Secondary | ICD-10-CM

## 2022-12-16 ENCOUNTER — Telehealth: Payer: Self-pay | Admitting: Primary Care

## 2022-12-16 NOTE — Telephone Encounter (Signed)
Copied from CRM [id number]. Topic: General - Other >> Dec 15, 2022  3:02 PM Santiya F wrote: Reason for CRM: [facility] with [facility] is calling in requesting a status update on a form she faxed over regarding the pt's incontinence supplies. Please follow up with [phone]

## 2022-12-16 NOTE — Telephone Encounter (Signed)
Form has been received and will be faxed back today.

## 2022-12-16 NOTE — Telephone Encounter (Signed)
Form has been faxed.

## 2022-12-19 ENCOUNTER — Other Ambulatory Visit: Payer: Self-pay

## 2022-12-19 ENCOUNTER — Other Ambulatory Visit (INDEPENDENT_AMBULATORY_CARE_PROVIDER_SITE_OTHER): Payer: Self-pay | Admitting: Primary Care

## 2022-12-19 ENCOUNTER — Emergency Department (HOSPITAL_COMMUNITY): Payer: Medicaid Other

## 2022-12-19 ENCOUNTER — Emergency Department (HOSPITAL_COMMUNITY)
Admission: EM | Admit: 2022-12-19 | Discharge: 2022-12-19 | Disposition: A | Discharge status: Home / Self Care | Payer: Medicaid Other | Attending: Emergency Medicine | Admitting: Emergency Medicine

## 2022-12-19 ENCOUNTER — Encounter (HOSPITAL_COMMUNITY): Payer: Self-pay | Admitting: Emergency Medicine

## 2022-12-19 DIAGNOSIS — M545 Low back pain, unspecified: Secondary | ICD-10-CM | POA: Insufficient documentation

## 2022-12-19 DIAGNOSIS — W19XXXA Unspecified fall, initial encounter: Secondary | ICD-10-CM

## 2022-12-19 DIAGNOSIS — Z79899 Other long term (current) drug therapy: Secondary | ICD-10-CM | POA: Insufficient documentation

## 2022-12-19 DIAGNOSIS — W010XXA Fall on same level from slipping, tripping and stumbling without subsequent striking against object, initial encounter: Secondary | ICD-10-CM | POA: Diagnosis not present

## 2022-12-19 DIAGNOSIS — Z7982 Long term (current) use of aspirin: Secondary | ICD-10-CM | POA: Diagnosis not present

## 2022-12-19 DIAGNOSIS — Y92512 Supermarket, store or market as the place of occurrence of the external cause: Secondary | ICD-10-CM | POA: Insufficient documentation

## 2022-12-19 MED ORDER — OXYCODONE HCL 5 MG PO TABS
10.0000 mg | ORAL_TABLET | Freq: Once | ORAL | Status: AC
  Administered 2022-12-19: 10 mg via ORAL
  Filled 2022-12-19: qty 2

## 2022-12-19 NOTE — Discharge Instructions (Signed)
You were seen in the emergency department for back pain following a fall.  Your CT scan was negative for any obvious fractures, dislocations.  I would recommend following up with her primary care provider for further management of her symptoms or not improving.  You can continue to take your oxycodone that you have a prescription for for continued pain relief.  If you have any worsening of your symptoms or begin to experience new neurological concerning symptoms such as saddle numbness, loss of bowel or bladder control, please return to the emergency department.

## 2022-12-19 NOTE — ED Triage Notes (Signed)
Pt BIB [facility] due to fall in [employer].  There was "red sauce on the floor and she slipped and fell down."  Pt does have chronic back pain issues.  VS BP 150/90, HR 100

## 2022-12-19 NOTE — ED Notes (Signed)
Got patient into a gown on the monitor patient is resting with call bell in reach 

## 2022-12-19 NOTE — ED Provider Notes (Signed)
[place] [facility]ERGENCY DEPARTMENT AT [facility] Provider Note   CSN: [id number] Arrival date & time: 12/19/22  1204     History Chief Complaint  Patient presents with   Heather Perry    Heather Perry is a 57 y.o. female.  Patient presents emergency department complaints of a mechanical fall.  She reports that she was at a grocery store earlier today when there was a liquid on the ground that she slipped on resulting in a fall directly onto her low back.  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  Endorsing significant pain in this area but denies any lower extremity weakness or numbness, saddle paresthesia, bowel or bladder incontinence.  Typically takes oxycodone 15 at home for pain control.   Fall       Home Medications Prior to Admission medications   Medication Sig Start Date End Date Taking? Authorizing Provider  AMITIZA 24 MCG capsule TAKE 1 CAPSULE (24 MCG TOTAL) BY MOUTH 2 (TWO) TIMES DAILY WITH A MEAL. 03/11/21   Raulkar, Drema Pry, MD  amLODipine (NORVASC) 5 MG tablet Take 1 tablet (5 mg total) by mouth daily. 08/29/22   Grayce Sessions, NP  aspirin EC 81 MG tablet Take 81 mg by mouth daily.    [provider]  atorvastatin (LIPITOR) 20 MG tablet TAKE ONE TABLET BY MOUTH ONCE DAILY FOR CHOLESTEROL 08/29/22   Grayce Sessions, NP  benzonatate (TESSALON) 100 MG capsule Take 1 capsule (100 mg total) by mouth 2 (two) times daily as needed for cough. 10/12/22   Grayce Sessions, NP  budesonide-formoterol (SYMBICORT) 160-4.5 MCG/ACT inhaler Inhale 2 puffs into the lungs 2 (two) times daily. Patient taking differently: Inhale 2 puffs into the lungs 2 (two) times daily as needed (wheezing, sob). 05/18/16   Henrietta Hoover, NP  celecoxib (CELEBREX) 100 MG capsule Take 1 capsule (100 mg total) by mouth 2 (two) times daily. 08/29/22   Grayce Sessions, NP  clonazePAM (KLONOPIN) 1 MG tablet Take 0.5 mg by mouth 2 (two) times daily as needed for  anxiety. 12/15/21   [provider]  cyclobenzaprine (FLEXERIL) 10 MG tablet TAKE 1 TABLET (10 MG TOTAL) BY MOUTH 3 (THREE) TIMES DAILY AS NEEDED FOR MUSCLE SPASMS. 05/22/22   Grayce Sessions, NP  dicyclomine (BENTYL) 20 MG tablet Take 1 tablet (20 mg total) by mouth 2 (two) times daily. 07/31/22   Alphia Kava, MD  esomeprazole (NEXIUM) 40 MG capsule Take one capsule as needed for indigestion 08/29/22   Grayce Sessions, NP  fluticasone (FLONASE) 50 MCG/ACT nasal spray PLACE 2 SPRAYS INTO BOTH NOSTRILS DAILY. 09/22/22   Grayce Sessions, NP  gabapentin (NEURONTIN) 300 MG capsule Take 300 mg by mouth 3 (three) times daily. 12/24/21   [provider]  hydrochlorothiazide (HYDRODIURIL) 25 MG tablet Take 1 tablet (25 mg total) by mouth daily. 08/29/22   Grayce Sessions, NP  hydroxychloroquine (PLAQUENIL) 200 MG tablet Take 400 mg by mouth daily. 03/21/17   [provider]  hydroxypropyl methylcellulose / hypromellose (ISOPTO TEARS / GONIOVISC) 2.5 % ophthalmic solution Place 1 drop into both eyes 3 (three) times daily as needed for dry eyes. 02/11/22   Grayce Sessions, NP  ipratropium-albuterol (DUONEB) 0.5-2.5 (3) MG/3ML SOLN Take 3 mLs by nebulization every 6 (six) hours as needed. Patient taking differently: Take 3 mLs by nebulization every 6 (six) hours as needed (for SOB). 05/18/16   Henrietta Hoover, NP  leflunomide (ARAVA)  20 MG tablet Take 20 mg by mouth daily. 11/11/19   [provider]  lidocaine (LIDODERM) 5 % Place 1 patch onto the skin daily. Remove & Discard patch within 12 hours or as directed by MD 04/05/22   Alvira Monday, MD  methylPREDNISolone (MEDROL DOSEPAK) 4 MG TBPK tablet Take as direted 06/03/22   Jannifer Franklin, MD  Misc. Devices (WALKER) MISC 1 Device by Does not apply route daily as needed. 07/03/21   Rhys Martini, PA-C  oxyCODONE (ROXICODONE) 15 MG immediate release tablet Take 15 mg by mouth 4 (four) times daily as needed  for pain. 12/29/21   [provider]  permethrin (ELIMITE) 5 % cream Apply to affected area once 06/18/22   Fayrene Helper, PA-C  phenytoin (DILANTIN) 100 MG ER capsule Take 100 mg by mouth 3 (three) times daily.    [provider]  sucralfate (CARAFATE) 1 GM/10ML suspension Take 10 mLs (1 g total) by mouth with breakfast, with lunch, and with evening meal. 12/06/22   Gloris Manchester, MD  traZODone (DESYREL) 50 MG tablet TAKE 1/2 TO 1 TABLET BY MOUTH AT BEDTIME AS NEEDED FOR SLEEP. 09/14/22   Grayce Sessions, NP  triamcinolone 0.1% oint-Eucerin equivalent cream 1:1 mixture Apply topically 3 (three) times daily. 05/18/22   Grayce Sessions, NP  triamcinolone cream (KENALOG) 0.1 % Apply 1 Application topically 3 (three) times daily. 05/18/22   Grayce Sessions, NP  VENTOLIN HFA 108 (90 Base) MCG/ACT inhaler INHALE 2 PUFFS BY MOUTH EVERY 6 HOURS AS NEEDED FOR SHORTNESS OF BREATH OR WHEEZING. 12/13/22   Grayce Sessions, NP      Allergies    Hydrocodone-acetaminophen, Lortab [hydrocodone-acetaminophen], Niacin, Penicillin g, and Penicillins    Review of Systems   Review of Systems  Musculoskeletal:  Positive for back pain.  All other systems reviewed and are negative.   Physical Exam Updated Vital Signs BP (!) 132/102 (BP Location: Right Arm)   Pulse 95   Temp 98.2 F (36.8 C) (Oral)   Resp 20   Ht 5\' 2"  (1.575 m)   Wt 93.4 kg   SpO2 100%   BMI 37.68 kg/m  Physical Exam Vitals and nursing note reviewed.  Constitutional:      General: She is not in acute distress.    Appearance: She is well-developed.  HENT:     Head: Normocephalic and atraumatic.  Eyes:     Conjunctiva/sclera: Conjunctivae normal.  Cardiovascular:     Rate and Rhythm: Normal rate and regular rhythm.     Heart sounds: No murmur heard. Pulmonary:     Effort: Pulmonary effort is normal. No respiratory distress.     Breath sounds: Normal breath sounds.  Abdominal:     General: Abdomen is  flat. Bowel sounds are normal. There is no distension.     Palpations: Abdomen is soft.     Tenderness: There is no abdominal tenderness.  Musculoskeletal:        General: Tenderness and signs of injury present. No swelling or deformity.     Cervical back: Neck supple.     Comments: Preserved range of motion in the lumbar spine.  No obvious bony deformity in this area.  Skin:    General: Skin is warm and dry.     Capillary Refill: Capillary refill takes less than 2 seconds.  Neurological:     Mental Status: She is alert.  Psychiatric:        Mood and Affect: Mood normal.  ED Results / Procedures / Treatments   Labs (all labs ordered are listed, but only abnormal results are displayed) Labs Reviewed - No data to display  EKG None  Radiology No results found.  Procedures Procedures   Medications Ordered in ED Medications  oxyCODONE (Oxy IR/ROXICODONE) immediate release tablet 10 mg (10 mg Oral Given 12/19/22 1251)    ED Course/ Medical Decision Making/ A&P                           Medical Decision Making Amount and/or Complexity of Data Reviewed Radiology: ordered.  Risk Prescription drug management.   This patient presents to the ED for concern of fall.  Differential diagnosis includes lumbar strain, lumbar disc herniation, sacral fracture, contusion of the lumbar spine   Imaging Studies ordered:  I ordered imaging studies including CT lumbar spine I independently visualized and interpreted imaging which showed no evidence of any fractures, dislocations, or evident disc herniation I agree with the radiologist interpretation   Medicines ordered and prescription drug management:  I ordered medication including oxycodone for pain Reevaluation of the patient after these medicines showed that the patient improved I have reviewed the patients home medicines and have made adjustments as needed   Problem List / ED Course:  Patient presents to the emergency  department with complaints of mechanical fall.  She reports that she slipped on sauce that was on the ground at the grocery store.  She landed on her lower back and is endorsing pain in this area.  Patient has a history of chronic low back pain as well.  Reports a history of a disc herniation but denies any surgical history in the lower back.  Has not take anything for pain up to this point.  Given concerning history of prior disc herniation in this area, will evaluate with CT imaging but patient is currently denying any lower extremity weakness or numbness, saddle paresthesia, bowel or bladder incontinence.  Low concern for cauda equina syndrome. CT imaging negative for any acute abnormality.  Informed patient of these findings and advised patient to follow-up with primary care provider in the outpatient setting as tolerated.  She reports that she has enough oxycodone at home and is renewing her prescription tomorrow.  No need for a bridging prescription.  Informed patient of return precautions for thing such as concerning neurological presentation consistent with cauda equina syndrome.  Patient verbalized understanding all return precautions and agreeable to treatment plan at this time.  No concerns for other areas of trauma or injury as there is no other area of focal tenderness in the spinal region.  Patient also denies any tenderness in the abdomen the patient's abdomen is soft on physical exam without any evident tenderness.  No pain or weakness in any extremity, cervical spine, head. Patient is discharged home in good condition. All questions answered prior to patient discharge.  Final Clinical Impression(s) / ED Diagnoses Final diagnoses:  Fall, initial encounter  Acute midline low back pain without sciatica    Rx / DC Orders ED Discharge Orders     None         Salomon Mast 12/21/22 1610    Wynetta Fines, MD 12/21/22 1718

## 2022-12-20 NOTE — Telephone Encounter (Signed)
Requested medication (s) are due for refill today: routing for review  Requested medication (s) are on the active medication list: no  Last refill:  unknown  Future visit scheduled: yes  Notes to clinic:  Unable to refill per protocol, Rx expired. Medication is not on current list, routing for approval.      Requested Prescriptions  Pending Prescriptions Disp Refills   cetirizine (ZYRTEC) 10 MG tablet [Pharmacy Med Name: CETIRIZINE HCL 10 MG ORAL TABLET] 90 tablet     Sig: TAKE ONE TABLET BY MOUTH ONCE DAILY FOR ALLERGIES     Ear, Nose, and Throat:  Antihistamines 2 Passed - 12/19/2022  2:18 PM      Passed - Cr in normal range and within 360 days    Creat  Date Value Ref Range Status  04/25/2016 0.72 0.50 - 1.05 mg/dL Final    Comment:      For patients > or = 59 years of age: The upper reference limit for Creatinine is approximately 13% higher for people identified as African-American.      Creatinine, Ser  Date Value Ref Range Status  12/06/2022 0.69 0.44 - 1.00 mg/dL Final         Passed - Valid encounter within last 12 months    Recent Outpatient Visits           1 month ago Medication management   [place] [facility] Grayce Sessions, NP   2 months ago Cervical cancer screening   [place] [facility] Grayce Sessions, NP   3 months ago Medication refill   [place] [facility] Grayce Sessions, NP   7 months ago Rash and nonspecific skin eruption   [place] [facility] Grayce Sessions, NP   8 months ago Essential hypertension   [place] [facility] Grayce Sessions, NP       Future Appointments             In 1 month Randa Evens, Kinnie Scales, NP  [facility]

## 2022-12-23 ENCOUNTER — Ambulatory Visit (INDEPENDENT_AMBULATORY_CARE_PROVIDER_SITE_OTHER): Payer: Self-pay

## 2022-12-23 ENCOUNTER — Other Ambulatory Visit (INDEPENDENT_AMBULATORY_CARE_PROVIDER_SITE_OTHER): Payer: Self-pay | Admitting: Primary Care

## 2022-12-23 DIAGNOSIS — W19XXXD Unspecified fall, subsequent encounter: Secondary | ICD-10-CM

## 2022-12-23 NOTE — Telephone Encounter (Signed)
  Chief Complaint: back pain Symptoms: lower back pain d/t fall on 7/1, R arm and R shoulder pain  Frequency: 12/19/22 Pertinent Negatives: NA Disposition: [] ED /[] Urgent Care (no appt availability in office) / [x] Appointment(In office/virtual)/ []  [place] [facility]/ [] Home Care/ [] Refused Recommended Disposition /[] [place] [facility]/ []  Follow-up with PCP Additional Notes: pt had fall in [employer] on 12/19/22, went to ED nd was seen for back pain. Pt says pain constant and 9/10 currently. She is taking her oxycodone that she has prescribed. Offered appt for 12/28/22 but it wont allow me to schedule so called [facility] and spoke with Jay'a about appt. She had me connect the pt and she was able to discuss appt with pt.   Reason for Disposition  [1] SEVERE back pain (e.g., excruciating, unable to do any normal activities) AND [2] not improved 2 hours after pain medicine  Answer Assessment - Initial Assessment Questions 1. ONSET: "When did the pain begin?"      12/19/22 2. LOCATION: "Where does it hurt?" (upper, mid or lower back)     Lower back  3. SEVERITY: "How bad is the pain?"  (e.g., Scale 1-10; mild, moderate, or severe)   - MILD (1-3): Doesn't interfere with normal activities.    - MODERATE (4-7): Interferes with normal activities or awakens from sleep.    - SEVERE (8-10): Excruciating pain, unable to do any normal activities.      9/10 4. PATTERN: "Is the pain constant?" (e.g., yes, no; constant, intermittent)      Constant  5. RADIATION: "Does the pain shoot into your legs or somewhere else?"     R arm R shoulder  6. CAUSE:  "What do you think is causing the back pain?"      Fall  8. MEDICINES: "What have you taken so far for the pain?" (e.g., nothing, acetaminophen, NSAIDS)     Oxycodone  10. OTHER SYMPTOMS: "Do you have any other symptoms?" (e.g., fever, abdomen pain, burning with urination, blood in urine)       no  Protocols used: Back Pain-A-AH

## 2022-12-31 ENCOUNTER — Encounter (HOSPITAL_COMMUNITY): Payer: Self-pay

## 2022-12-31 ENCOUNTER — Other Ambulatory Visit: Payer: Self-pay

## 2022-12-31 ENCOUNTER — Emergency Department (HOSPITAL_COMMUNITY)
Admission: EM | Admit: 2022-12-31 | Discharge: 2022-12-31 | Disposition: A | Discharge status: Home / Self Care | Payer: Medicaid Other | Attending: Emergency Medicine | Admitting: Emergency Medicine

## 2022-12-31 DIAGNOSIS — Z79899 Other long term (current) drug therapy: Secondary | ICD-10-CM | POA: Diagnosis not present

## 2022-12-31 DIAGNOSIS — G8929 Other chronic pain: Secondary | ICD-10-CM | POA: Diagnosis not present

## 2022-12-31 DIAGNOSIS — M545 Low back pain, unspecified: Secondary | ICD-10-CM | POA: Diagnosis present

## 2022-12-31 DIAGNOSIS — M5442 Lumbago with sciatica, left side: Secondary | ICD-10-CM | POA: Diagnosis not present

## 2022-12-31 DIAGNOSIS — I10 Essential (primary) hypertension: Secondary | ICD-10-CM | POA: Insufficient documentation

## 2022-12-31 DIAGNOSIS — Z7982 Long term (current) use of aspirin: Secondary | ICD-10-CM | POA: Diagnosis not present

## 2022-12-31 MED ORDER — DEXAMETHASONE SODIUM PHOSPHATE 10 MG/ML IJ SOLN
10.0000 mg | Freq: Once | INTRAMUSCULAR | Status: AC
  Administered 2022-12-31: 10 mg via INTRAMUSCULAR
  Filled 2022-12-31: qty 1

## 2022-12-31 MED ORDER — METHYLPREDNISOLONE 4 MG PO TBPK: ORAL_TABLET | ORAL | 0 refills | Status: DC

## 2022-12-31 MED ORDER — KETOROLAC TROMETHAMINE 15 MG/ML IJ SOLN
15.0000 mg | Freq: Once | INTRAMUSCULAR | Status: AC
  Administered 2022-12-31: 15 mg via INTRAMUSCULAR
  Filled 2022-12-31: qty 1

## 2022-12-31 NOTE — ED Triage Notes (Signed)
BIBA - pt c/o left flank and left sided back pain. Pt had a fall in [employer] on 7/1 - went to the ER and ruled out any fx. Pt states she takes roxy 15mg  at home for chronic back pain and the pain is not improving. Denies urinary symptoms. Pt also voices concern for constipation.

## 2022-12-31 NOTE — ED Provider Notes (Signed)
[place] EMERGENCY DEPARTMENT AT [facility] Provider Note   CSN: [id number] Arrival date & time: 12/31/22  0357     History  Chief Complaint  Patient presents with   Back Pain    Flank pain    Heather Perry is a 57 y.o. female.  Patient presents to the emergency department complaining of left-sided low back pain with occasional radiation down the left leg.  Patient with history of chronic back pain with lumbar radiculopathy.  Patient currently taking 15 mg Roxicodone 4 times daily as needed for pain.  Patient endorses a fall in [place] on July 1, subsequently evaluated at the emergency department with no acute fracture or abnormality noted on imaging, which exacerbated her pain.  She does have upcoming appointment with her spinal surgeon on Tuesday and also sees pain management.  Patient currently denies saddle anesthesia, urinary incontinence, urinary retention, fecal incontinence, extremity weakness.  Patient denies any new trauma to the area.  Past medical history significant for hypertension, anxiety, major depressive disorder, lumbar degenerative disc disease, osteoporosis  HPI     Home Medications Prior to Admission medications   Medication Sig Start Date End Date Taking? Authorizing Provider  methylPREDNISolone (MEDROL DOSEPAK) 4 MG TBPK tablet Take as directed per package instructions 12/31/22  Yes Darrick Grinder, PA-C  AMITIZA 24 MCG capsule TAKE 1 CAPSULE (24 MCG TOTAL) BY MOUTH 2 (TWO) TIMES DAILY WITH A MEAL. 03/11/21   Raulkar, Drema Pry, MD  amLODipine (NORVASC) 5 MG tablet Take 1 tablet (5 mg total) by mouth daily. 08/29/22   Grayce Sessions, NP  aspirin EC 81 MG tablet Take 81 mg by mouth daily.    [provider]  atorvastatin (LIPITOR) 20 MG tablet TAKE ONE TABLET BY MOUTH ONCE DAILY FOR CHOLESTEROL 08/29/22   Grayce Sessions, NP  benzonatate (TESSALON) 100 MG capsule Take 1 capsule (100 mg total) by mouth 2 (two) times daily as needed for  cough. 10/12/22   Grayce Sessions, NP  budesonide-formoterol (SYMBICORT) 160-4.5 MCG/ACT inhaler Inhale 2 puffs into the lungs 2 (two) times daily. Patient taking differently: Inhale 2 puffs into the lungs 2 (two) times daily as needed (wheezing, sob). 05/18/16   Henrietta Hoover, NP  celecoxib (CELEBREX) 100 MG capsule Take 1 capsule (100 mg total) by mouth 2 (two) times daily. 08/29/22   Grayce Sessions, NP  cetirizine (ZYRTEC) 10 MG tablet TAKE ONE TABLET BY MOUTH ONCE DAILY FOR ALLERGIES 12/21/22   Grayce Sessions, NP  clonazePAM (KLONOPIN) 1 MG tablet Take 0.5 mg by mouth 2 (two) times daily as needed for anxiety. 12/15/21   [provider]  cyclobenzaprine (FLEXERIL) 10 MG tablet TAKE 1 TABLET (10 MG TOTAL) BY MOUTH 3 (THREE) TIMES DAILY AS NEEDED FOR MUSCLE SPASMS. 05/22/22   Grayce Sessions, NP  dicyclomine (BENTYL) 20 MG tablet Take 1 tablet (20 mg total) by mouth 2 (two) times daily. 07/31/22   Alphia Kava, MD  esomeprazole (NEXIUM) 40 MG capsule Take one capsule as needed for indigestion 08/29/22   Grayce Sessions, NP  fluticasone (FLONASE) 50 MCG/ACT nasal spray PLACE 2 SPRAYS INTO BOTH NOSTRILS DAILY. 09/22/22   Grayce Sessions, NP  gabapentin (NEURONTIN) 300 MG capsule Take 300 mg by mouth 3 (three) times daily. 12/24/21   [provider]  hydrochlorothiazide (HYDRODIURIL) 25 MG tablet Take 1 tablet (25 mg total) by mouth daily. 08/29/22   Grayce Sessions, NP  hydroxychloroquine (PLAQUENIL) 200 MG  tablet Take 400 mg by mouth daily. 03/21/17   [provider]  hydroxypropyl methylcellulose / hypromellose (ISOPTO TEARS / GONIOVISC) 2.5 % ophthalmic solution Place 1 drop into both eyes 3 (three) times daily as needed for dry eyes. 02/11/22   Grayce Sessions, NP  ipratropium-albuterol (DUONEB) 0.5-2.5 (3) MG/3ML SOLN Take 3 mLs by nebulization every 6 (six) hours as needed. Patient taking differently: Take 3 mLs by nebulization every 6 (six)  hours as needed (for SOB). 05/18/16   Henrietta Hoover, NP  leflunomide (ARAVA) 20 MG tablet Take 20 mg by mouth daily. 11/11/19   [provider]  lidocaine (LIDODERM) 5 % Place 1 patch onto the skin daily. Remove & Discard patch within 12 hours or as directed by MD 04/05/22   Alvira Monday, MD  Misc. Devices (WALKER) MISC 1 Device by Does not apply route daily as needed. 07/03/21   Rhys Martini, PA-C  oxyCODONE (ROXICODONE) 15 MG immediate release tablet Take 15 mg by mouth 4 (four) times daily as needed for pain. 12/29/21   [provider]  permethrin (ELIMITE) 5 % cream Apply to affected area once 06/18/22   Fayrene Helper, PA-C  phenytoin (DILANTIN) 100 MG ER capsule Take 100 mg by mouth 3 (three) times daily.    [provider]  sucralfate (CARAFATE) 1 GM/10ML suspension Take 10 mLs (1 g total) by mouth with breakfast, with lunch, and with evening meal. 12/06/22   Gloris Manchester, MD  traZODone (DESYREL) 50 MG tablet TAKE 1/2 TO 1 TABLET BY MOUTH AT BEDTIME AS NEEDED FOR SLEEP. 09/14/22   Grayce Sessions, NP  triamcinolone 0.1% oint-Eucerin equivalent cream 1:1 mixture Apply topically 3 (three) times daily. 05/18/22   Grayce Sessions, NP  triamcinolone cream (KENALOG) 0.1 % Apply 1 Application topically 3 (three) times daily. 05/18/22   Grayce Sessions, NP  VENTOLIN HFA 108 (90 Base) MCG/ACT inhaler INHALE 2 PUFFS BY MOUTH EVERY 6 HOURS AS NEEDED FOR SHORTNESS OF BREATH OR WHEEZING. 12/13/22   Grayce Sessions, NP      Allergies    Hydrocodone-acetaminophen, Lortab [hydrocodone-acetaminophen], Niacin, Penicillin g, and Penicillins    Review of Systems   Review of Systems  Physical Exam Updated Vital Signs BP (!) 129/106   Pulse 93   Temp 97.7 F (36.5 C) (Oral)   Resp 18   Ht 5\' 2"  (1.575 m)   Wt 93.4 kg   SpO2 99%   BMI 37.68 kg/m  Physical Exam Vitals and nursing note reviewed.  Constitutional:      General: She is not in acute  distress.    Appearance: She is well-developed.  HENT:     Head: Normocephalic and atraumatic.  Eyes:     Conjunctiva/sclera: Conjunctivae normal.  Cardiovascular:     Rate and Rhythm: Normal rate and regular rhythm.  Pulmonary:     Effort: Pulmonary effort is normal. No respiratory distress.     Breath sounds: Normal breath sounds.  Abdominal:     Palpations: Abdomen is soft.     Tenderness: There is no abdominal tenderness.  Musculoskeletal:        General: No swelling.     Cervical back: Normal range of motion and neck supple.     Comments: No midline spinal tenderness.  Patient with tenderness to palpation of the left lumbar region.  Negative SLR bilaterally.  Skin:    General: Skin is warm and dry.     Capillary Refill: Capillary  refill takes less than 2 seconds.  Neurological:     Mental Status: She is alert.     Sensory: No sensory deficit.     Motor: No weakness.     Gait: Gait normal.  Psychiatric:        Mood and Affect: Mood normal.     ED Results / Procedures / Treatments   Labs (all labs ordered are listed, but only abnormal results are displayed) Labs Reviewed - No data to display  EKG None  Radiology No results found.  Procedures Procedures    Medications Ordered in ED Medications  dexamethasone (DECADRON) injection 10 mg (10 mg Intramuscular Given 12/31/22 0445)  ketorolac (TORADOL) 15 MG/ML injection 15 mg (15 mg Intramuscular Given 12/31/22 0445)    ED Course/ Medical Decision Making/ A&P                             Medical Decision Making Risk Prescription drug management.   This patient presents to the ED for concern of back pain, this involves an extensive number of treatment options, and is a complaint that carries with it a high risk of complications and morbidity.  The differential diagnosis includes degenerative disc disease, lumbar radiculopathy, fracture, dislocation, others   Co morbidities that complicate the patient  evaluation  History of chronic low back pain   Additional history obtained:  Additional history obtained from EMS External records from outside source obtained and reviewed including notes from pain management  Problem List / ED Course / Critical interventions / Medication management   I ordered medication including Toradol and Decadron for back pain Reevaluation of the patient after these medicines showed that the patient improved I have reviewed the patients home medicines and have made adjustments as needed   Social Determinants of Health:  Patient has Medicaid for primary insurance type   Test / Admission - Considered:  Patient with no red flag symptoms to suggest cauda equina.  No indication at this time for repeat spinal imaging.  Patient had advanced spinal imaging earlier this month.  Patient's symptoms seem like a flare of her underlying lumbar degenerative disc disease.  Patient with follow-up planned for Tuesday with her spinal specialist.  Will discharge with Medrol Dosepak and recommendations for supportive care at home.  Patient voices understanding with plan.  No indication for admission.  Discharge home.         Final Clinical Impression(s) / ED Diagnoses Final diagnoses:  Chronic left-sided low back pain with left-sided sciatica    Rx / DC Orders ED Discharge Orders          Ordered    methylPREDNISolone (MEDROL DOSEPAK) 4 MG TBPK tablet        12/31/22 0432              Darrick Grinder, PA-C 12/31/22 0536    Nira Conn, MD 12/31/22 [phone]

## 2022-12-31 NOTE — Discharge Instructions (Signed)
You were evaluated today for low back pain.  Your back pain seems consistent with your underlying chronic back pain and radiculopathy.  I have prescribed a steroid Dosepak to help treat your inflammation.  Please keep your scheduled follow-up appointment with your spinal specialist on Tuesday.  If you develop any life-threatening symptoms please return to the emergency department.

## 2023-01-09 ENCOUNTER — Ambulatory Visit (INDEPENDENT_AMBULATORY_CARE_PROVIDER_SITE_OTHER): Payer: Medicaid Other | Admitting: Physician Assistant

## 2023-01-09 ENCOUNTER — Encounter (INDEPENDENT_AMBULATORY_CARE_PROVIDER_SITE_OTHER): Payer: Self-pay

## 2023-01-09 VITALS — BP 136/78 | HR 104 | Resp 16 | Wt 199.6 lb

## 2023-01-09 DIAGNOSIS — M5136 Other intervertebral disc degeneration, lumbar region: Secondary | ICD-10-CM

## 2023-01-09 DIAGNOSIS — Z09 Encounter for follow-up examination after completed treatment for conditions other than malignant neoplasm: Secondary | ICD-10-CM

## 2023-01-09 MED ORDER — PREDNISONE 10 MG PO TABS
10.0000 mg | ORAL_TABLET | Freq: Every day | ORAL | 0 refills | Status: DC
Start: 2023-01-09 — End: 2023-03-15

## 2023-01-09 MED ORDER — METHOCARBAMOL 500 MG PO TABS
500.0000 mg | ORAL_TABLET | Freq: Four times a day (QID) | ORAL | 0 refills | Status: DC
Start: 2023-01-09 — End: 2023-01-30

## 2023-01-09 NOTE — Progress Notes (Signed)
Patient ID: Heather Perry, female   DOB: Nov 02, 1965, 57 y.o.   MRN: [id number]     Heather Perry, is a 57 y.o. female  [id number]  [id number]  DOB - 08-08-1965  Chief Complaint  Patient presents with   Hospitalization Follow-up    ED       Subjective:   Heather Perry is a 57 y.o. female here today for a follow up visit  After being seen at the ED 12/31/2022 for back pain.  The pain was improving on prednisone but she finished it.  She does have an appt on 7/26 for spinal injection. She is requesting to be OOW this week while she does this.    From ED note: Heather Perry is a 57 y.o. female.  Patient presents to the emergency department complaining of left-sided low back pain with occasional radiation down the left leg.  Patient with history of chronic back pain with lumbar radiculopathy.  Patient currently taking 15 mg Roxicodone 4 times daily as needed for pain.  Patient endorses a fall in [place] on July 1, subsequently evaluated at the emergency department with no acute fracture or abnormality noted on imaging, which exacerbated her pain.  She does have upcoming appointment with her spinal surgeon on Tuesday and also sees pain management.  Patient currently denies saddle anesthesia, urinary incontinence, urinary retention, fecal incontinence, extremity weakness.  Patient denies any new trauma to the area.  Past medical history significant for hypertension, anxiety, major depressive disorder, lumbar degenerative disc disease, osteoporosis   From A/P:  ordered medication including Toradol and Decadron for back pain Reevaluation of the patient after these medicines showed that the patient improved I have reviewed the patients home medicines and have made adjustments as needed   IMPRESSION: 1. No acute fracture or traumatic malalignment of the lumbar spine. 2. Advanced facet arthropathy at L4-L5 contributing to moderate spinal canal stenosis and moderate bilateral neural  foraminal stenosis. 3. Severe bilateral neural foraminal stenosis at L5-S1 due to endplate spurring and moderate bilateral facet arthropathy. No problems updated.  ALLERGIES: Allergies  Allergen Reactions   Hydrocodone-Acetaminophen Itching    Started itching.  Pt states that she can take this medication Other reaction(s): Other (See Comments) Pruritus   Lortab [Hydrocodone-Acetaminophen]     Started itching   Niacin Hives   Penicillin G Hives and Nausea And Vomiting    Did it involve swelling of the face/tongue/throat, SOB, or low BP? No Did it involve sudden or severe rash/hives, skin peeling, or any reaction on the inside of your mouth or nose? Yes Did you need to seek medical attention at a hospital or doctor's office? No When did it last happen?    Over 20 years Ago   If all above answers are "NO", may proceed with cephalosporin use.     Penicillins Itching    PAST MEDICAL HISTORY: Past Medical History:  Diagnosis Date   Allergy    Anxiety    Arthritis    Asthma    Blood transfusion without reported diagnosis    Depression    GERD (gastroesophageal reflux disease)    High cholesterol    Hypertension    Obesity    Osteoporosis    Recurrent boils    Seizures (HCC)    last seizure "3 years ago" per pt 06-16-16 KBW   Sleep apnea    no CPAP used   Uterine fibroid     MEDICATIONS AT HOME: Prior to Admission medications   Medication Sig  Start Date End Date Taking? Authorizing Provider  methocarbamol (ROBAXIN) 500 MG tablet Take 1 tablet (500 mg total) by mouth 4 (four) times daily. 01/09/23  Yes Georgian Co M, PA-C  predniSONE (DELTASONE) 10 MG tablet Take 1 tablet (10 mg total) by mouth daily with breakfast. 01/09/23  Yes Ethal Gotay M, PA-C  AMITIZA 24 MCG capsule TAKE 1 CAPSULE (24 MCG TOTAL) BY MOUTH 2 (TWO) TIMES DAILY WITH A MEAL. 03/11/21   Raulkar, Drema Pry, MD  amLODipine (NORVASC) 5 MG tablet Take 1 tablet (5 mg total) by mouth daily. 08/29/22    Grayce Sessions, NP  aspirin EC 81 MG tablet Take 81 mg by mouth daily.    [provider]  atorvastatin (LIPITOR) 20 MG tablet TAKE ONE TABLET BY MOUTH ONCE DAILY FOR CHOLESTEROL 08/29/22   Grayce Sessions, NP  benzonatate (TESSALON) 100 MG capsule Take 1 capsule (100 mg total) by mouth 2 (two) times daily as needed for cough. 10/12/22   Grayce Sessions, NP  budesonide-formoterol (SYMBICORT) 160-4.5 MCG/ACT inhaler Inhale 2 puffs into the lungs 2 (two) times daily. Patient taking differently: Inhale 2 puffs into the lungs 2 (two) times daily as needed (wheezing, sob). 05/18/16   Henrietta Hoover, NP  celecoxib (CELEBREX) 100 MG capsule Take 1 capsule (100 mg total) by mouth 2 (two) times daily. 08/29/22   Grayce Sessions, NP  cetirizine (ZYRTEC) 10 MG tablet TAKE ONE TABLET BY MOUTH ONCE DAILY FOR ALLERGIES 12/21/22   Grayce Sessions, NP  clonazePAM (KLONOPIN) 1 MG tablet Take 0.5 mg by mouth 2 (two) times daily as needed for anxiety. 12/15/21   [provider]  cyclobenzaprine (FLEXERIL) 10 MG tablet TAKE 1 TABLET (10 MG TOTAL) BY MOUTH 3 (THREE) TIMES DAILY AS NEEDED FOR MUSCLE SPASMS. 05/22/22   Grayce Sessions, NP  dicyclomine (BENTYL) 20 MG tablet Take 1 tablet (20 mg total) by mouth 2 (two) times daily. 07/31/22   Alphia Kava, MD  esomeprazole (NEXIUM) 40 MG capsule Take one capsule as needed for indigestion 08/29/22   Grayce Sessions, NP  fluticasone (FLONASE) 50 MCG/ACT nasal spray PLACE 2 SPRAYS INTO BOTH NOSTRILS DAILY. 09/22/22   Grayce Sessions, NP  gabapentin (NEURONTIN) 300 MG capsule Take 300 mg by mouth 3 (three) times daily. 12/24/21   [provider]  hydrochlorothiazide (HYDRODIURIL) 25 MG tablet Take 1 tablet (25 mg total) by mouth daily. 08/29/22   Grayce Sessions, NP  hydroxychloroquine (PLAQUENIL) 200 MG tablet Take 400 mg by mouth daily. 03/21/17   [provider]  hydroxypropyl methylcellulose / hypromellose  (ISOPTO TEARS / GONIOVISC) 2.5 % ophthalmic solution Place 1 drop into both eyes 3 (three) times daily as needed for dry eyes. 02/11/22   Grayce Sessions, NP  ipratropium-albuterol (DUONEB) 0.5-2.5 (3) MG/3ML SOLN Take 3 mLs by nebulization every 6 (six) hours as needed. Patient taking differently: Take 3 mLs by nebulization every 6 (six) hours as needed (for SOB). 05/18/16   Henrietta Hoover, NP  leflunomide (ARAVA) 20 MG tablet Take 20 mg by mouth daily. 11/11/19   [provider]  lidocaine (LIDODERM) 5 % Place 1 patch onto the skin daily. Remove & Discard patch within 12 hours or as directed by MD 04/05/22   Alvira Monday, MD  methylPREDNISolone (MEDROL DOSEPAK) 4 MG TBPK tablet Take as directed per package instructions 12/31/22   Darrick Grinder, PA-C  Misc. Devices (WALKER) MISC 1 Device by Does not apply  route daily as needed. 07/03/21   Rhys Martini, PA-C  oxyCODONE (ROXICODONE) 15 MG immediate release tablet Take 15 mg by mouth 4 (four) times daily as needed for pain. 12/29/21   [provider]  permethrin (ELIMITE) 5 % cream Apply to affected area once 06/18/22   Fayrene Helper, PA-C  phenytoin (DILANTIN) 100 MG ER capsule Take 100 mg by mouth 3 (three) times daily.    [provider]  sucralfate (CARAFATE) 1 GM/10ML suspension Take 10 mLs (1 g total) by mouth with breakfast, with lunch, and with evening meal. 12/06/22   Gloris Manchester, MD  traZODone (DESYREL) 50 MG tablet TAKE 1/2 TO 1 TABLET BY MOUTH AT BEDTIME AS NEEDED FOR SLEEP. 09/14/22   Grayce Sessions, NP  triamcinolone 0.1% oint-Eucerin equivalent cream 1:1 mixture Apply topically 3 (three) times daily. 05/18/22   Grayce Sessions, NP  triamcinolone cream (KENALOG) 0.1 % Apply 1 Application topically 3 (three) times daily. 05/18/22   Grayce Sessions, NP  VENTOLIN HFA 108 (90 Base) MCG/ACT inhaler INHALE 2 PUFFS BY MOUTH EVERY 6 HOURS AS NEEDED FOR SHORTNESS OF BREATH OR WHEEZING. 12/13/22    Grayce Sessions, NP    ROS: Neg HEENT Neg resp Neg cardiac Neg GI Neg GU Neg psych Neg neuro  Objective:   Vitals:   01/09/23 1037 01/09/23 1039  BP: 137/83 136/78  Pulse: (!) 104   Resp: 16   SpO2: 96%   Weight: 199 lb 9.6 oz (90.5 kg)    Exam General appearance : Awake, alert, not in any distress. Speech Clear. Not toxic looking HEENT: Atraumatic and Normocephalic Neck: Supple, no JVD. No cervical lymphadenopathy.  Chest: Good air entry bilaterally, CTAB.  No rales/rhonchi/wheezing CVS: S1 S2 regular, no murmurs.  Extremities: B/L Lower Ext shows no edema, both legs are warm to touch Neurology: Awake alert, and oriented X 3, CN II-XII intact, Non focal Skin: No Rash  Data Review Lab Results  Component Value Date   HGBA1C 5.1 10/28/2021   HGBA1C 5.3 04/25/2016    Assessment & Plan   1. Lumbar degenerative disc disease No red flags.  She has f/up this week for injections at [facility] - predniSONE (DELTASONE) 10 MG tablet; Take 1 tablet (10 mg total) by mouth daily with breakfast.  Dispense: 21 tablet; Refill: 0 - methocarbamol (ROBAXIN) 500 MG tablet; Take 1 tablet (500 mg total) by mouth 4 (four) times daily.  Dispense: 60 tablet; Refill: 0  2. Encounter for examination following treatment at hospital    Has f/up with PCP  The patient was given clear instructions to go to ER or return to medical center if symptoms don't improve, worsen or new problems develop. The patient verbalized understanding. The patient was told to call to get lab results if they haven't heard anything in the next week.      Georgian Co, PA-C [facility] [place], [place] [phone]   01/09/2023, 11:29 AM

## 2023-01-19 ENCOUNTER — Other Ambulatory Visit (INDEPENDENT_AMBULATORY_CARE_PROVIDER_SITE_OTHER): Payer: Self-pay | Admitting: Primary Care

## 2023-01-19 DIAGNOSIS — F5101 Primary insomnia: Secondary | ICD-10-CM

## 2023-01-20 NOTE — Telephone Encounter (Signed)
Requested Prescriptions  Pending Prescriptions Disp Refills   fluticasone (FLONASE) 50 MCG/ACT nasal spray [Pharmacy Med Name: FLUTICASONE PROPIONATE 50 MCG/ACT NASAL SUSPENSION] 16 g 0    Sig: PLACE 2 SPRAYS INTO BOTH NOSTRILS DAILY.     Ear, Nose, and Throat: Nasal Preparations - Corticosteroids Passed - 01/19/2023 12:26 PM      Passed - Valid encounter within last 12 months    Recent Outpatient Visits           1 week ago Lumbar degenerative disc disease   [place] [facility] [place], Marzella Schlein, PA-C   2 months ago Medication management   [place] [facility] Grayce Sessions, NP   3 months ago Cervical cancer screening   [place] [facility] Grayce Sessions, NP   4 months ago Medication refill   [place] [facility] Grayce Sessions, NP   8 months ago Rash and nonspecific skin eruption   [place] [facility] Grayce Sessions, NP       Future Appointments             In 1 week Randa Evens Kinnie Scales, NP [place] [facility]             traZODone (DESYREL) 50 MG tablet [Pharmacy Med Name: TRAZODONE HCL 50 MG ORAL TABLET] 90 tablet 0    Sig: TAKE 1/2 TO 1 TABLET BY MOUTH AT BEDTIME AS NEEDED FOR SLEEP.     Psychiatry: Antidepressants - Serotonin Modulator Passed - 01/19/2023 12:26 PM      Passed - Completed PHQ-2 or PHQ-9 in the last 360 days      Passed - Valid encounter within last 6 months    Recent Outpatient Visits           1 week ago Lumbar degenerative disc disease   [place] [facility] Anders Simmonds, PA-C   2 months ago Medication management   [place] [facility] Grayce Sessions, NP   3 months ago Cervical cancer screening   [place] [facility] Grayce Sessions, NP   4 months ago Medication refill   [place] [facility] Grayce Sessions, NP   8  months ago Rash and nonspecific skin eruption   [place] [facility] Grayce Sessions, NP       Future Appointments             In 1 week Randa Evens, Kinnie Scales, NP [place] [facility]

## 2023-01-27 ENCOUNTER — Telehealth (INDEPENDENT_AMBULATORY_CARE_PROVIDER_SITE_OTHER): Payer: Self-pay | Admitting: Primary Care

## 2023-01-27 NOTE — Telephone Encounter (Signed)
Spoke to pt. Will be at apt.  

## 2023-01-30 ENCOUNTER — Ambulatory Visit (INDEPENDENT_AMBULATORY_CARE_PROVIDER_SITE_OTHER): Payer: Medicaid Other | Admitting: Primary Care

## 2023-01-30 ENCOUNTER — Encounter (INDEPENDENT_AMBULATORY_CARE_PROVIDER_SITE_OTHER): Payer: Self-pay | Admitting: Primary Care

## 2023-01-30 VITALS — BP 128/83 | HR 101 | Resp 16 | Wt 203.4 lb

## 2023-01-30 DIAGNOSIS — F1410 Cocaine abuse, uncomplicated: Secondary | ICD-10-CM | POA: Insufficient documentation

## 2023-01-30 DIAGNOSIS — F5101 Primary insomnia: Secondary | ICD-10-CM | POA: Diagnosis not present

## 2023-01-30 DIAGNOSIS — W19XXXD Unspecified fall, subsequent encounter: Secondary | ICD-10-CM | POA: Diagnosis not present

## 2023-01-30 DIAGNOSIS — M5136 Other intervertebral disc degeneration, lumbar region: Secondary | ICD-10-CM | POA: Diagnosis not present

## 2023-01-30 DIAGNOSIS — I10 Essential (primary) hypertension: Secondary | ICD-10-CM

## 2023-01-30 DIAGNOSIS — Z0000 Encounter for general adult medical examination without abnormal findings: Secondary | ICD-10-CM

## 2023-01-30 DIAGNOSIS — M3500 Sicca syndrome, unspecified: Secondary | ICD-10-CM | POA: Insufficient documentation

## 2023-01-30 DIAGNOSIS — Z760 Encounter for issue of repeat prescription: Secondary | ICD-10-CM

## 2023-01-30 MED ORDER — AMLODIPINE BESYLATE 5 MG PO TABS
5.0000 mg | ORAL_TABLET | Freq: Every day | ORAL | 1 refills | Status: DC
Start: 2023-01-30 — End: 2023-07-25

## 2023-01-30 MED ORDER — HYDROCHLOROTHIAZIDE 25 MG PO TABS: 25.0000 mg | ORAL_TABLET | Freq: Every day | ORAL | 3 refills | Status: DC

## 2023-01-30 MED ORDER — TRAZODONE HCL 50 MG PO TABS
25.0000 mg | ORAL_TABLET | Freq: Every evening | ORAL | 0 refills | Status: DC | PRN
Start: 2023-01-30 — End: 2023-04-26

## 2023-01-30 MED ORDER — METHOCARBAMOL 500 MG PO TABS
500.0000 mg | ORAL_TABLET | Freq: Three times a day (TID) | ORAL | 1 refills | Status: DC | PRN
Start: 2023-01-30 — End: 2023-07-27

## 2023-01-30 NOTE — Progress Notes (Signed)
[facility]  Heather Perry, is a 57 y.o. female  [id number]:[id number]  [id number]:[id number]  DOB - August 19, 1965  Chief Complaint  Patient presents with   Hypertension       Subjective:   Heather Perry is a 57 y.o. female here today for a follow up visit ED for a fall in [place] on a spill without a sign over it. Patient is now suffering from headache, cervical and lumbar pain. Insomnia due to unable to find a comfortable position to sleep.No chest pain, No abdominal pain - No Nausea, No new weakness tingling or numbness, No Cough - shortness of breath. HTN well controlled.   No problems updated.  Allergies  Allergen Reactions   Hydrocodone-Acetaminophen Itching    Started itching.  Pt states that she can take this medication Other reaction(s): Other (See Comments) Pruritus   Lortab [Hydrocodone-Acetaminophen]     Started itching   Niacin Hives   Penicillin G Hives and Nausea And Vomiting    Did it involve swelling of the face/tongue/throat, SOB, or low BP? No Did it involve sudden or severe rash/hives, skin peeling, or any reaction on the inside of your mouth or nose? Yes Did you need to seek medical attention at a hospital or doctor's office? No When did it last happen?    Over 20 years Ago   If all above answers are "NO", may proceed with cephalosporin use.     Penicillins Itching    Past Medical History:  Diagnosis Date   Allergy    Anxiety    Arthritis    Asthma    Blood transfusion without reported diagnosis    Depression    GERD (gastroesophageal reflux disease)    High cholesterol    Hypertension    Obesity    Osteoporosis    Recurrent boils    Seizures (HCC)    last seizure "3 years ago" per pt 06-16-16 KBW   Sleep apnea    no CPAP used   Uterine fibroid     Current Outpatient Medications on File Prior to Visit  Medication Sig Dispense Refill   AMITIZA 24 MCG capsule TAKE 1 CAPSULE (24 MCG TOTAL) BY MOUTH 2 (TWO) TIMES DAILY WITH A MEAL. 60  capsule 1   amLODipine (NORVASC) 5 MG tablet Take 1 tablet (5 mg total) by mouth daily. 90 tablet 1   aspirin EC 81 MG tablet Take 81 mg by mouth daily.     atorvastatin (LIPITOR) 20 MG tablet TAKE ONE TABLET BY MOUTH ONCE DAILY FOR CHOLESTEROL 90 tablet 1   benzonatate (TESSALON) 100 MG capsule Take 1 capsule (100 mg total) by mouth 2 (two) times daily as needed for cough. (Patient not taking: Reported on 01/30/2023) 20 capsule 0   budesonide-formoterol (SYMBICORT) 160-4.5 MCG/ACT inhaler Inhale 2 puffs into the lungs 2 (two) times daily. (Patient taking differently: Inhale 2 puffs into the lungs 2 (two) times daily as needed (wheezing, sob).) 1 Inhaler 1   celecoxib (CELEBREX) 100 MG capsule Take 1 capsule (100 mg total) by mouth 2 (two) times daily. 180 capsule 1   cetirizine (ZYRTEC) 10 MG tablet TAKE ONE TABLET BY MOUTH ONCE DAILY FOR ALLERGIES 90 tablet 1   clonazePAM (KLONOPIN) 1 MG tablet Take 0.5 mg by mouth 2 (two) times daily as needed for anxiety.     cyclobenzaprine (FLEXERIL) 10 MG tablet TAKE 1 TABLET (10 MG TOTAL) BY MOUTH 3 (THREE) TIMES DAILY AS NEEDED FOR MUSCLE SPASMS. 90 tablet 1  dicyclomine (BENTYL) 20 MG tablet Take 1 tablet (20 mg total) by mouth 2 (two) times daily. 20 tablet 0   esomeprazole (NEXIUM) 40 MG capsule Take one capsule as needed for indigestion 90 capsule 1   fluticasone (FLONASE) 50 MCG/ACT nasal spray PLACE 2 SPRAYS INTO BOTH NOSTRILS DAILY. 16 g 0   gabapentin (NEURONTIN) 300 MG capsule Take 300 mg by mouth 3 (three) times daily.     hydrochlorothiazide (HYDRODIURIL) 25 MG tablet Take 1 tablet (25 mg total) by mouth daily. 90 tablet 3   hydroxychloroquine (PLAQUENIL) 200 MG tablet Take 400 mg by mouth daily.     hydroxypropyl methylcellulose / hypromellose (ISOPTO TEARS / GONIOVISC) 2.5 % ophthalmic solution Place 1 drop into both eyes 3 (three) times daily as needed for dry eyes. 15 mL 3   ipratropium-albuterol (DUONEB) 0.5-2.5 (3) MG/3ML SOLN Take 3 mLs by  nebulization every 6 (six) hours as needed. (Patient taking differently: Take 3 mLs by nebulization every 6 (six) hours as needed (for SOB).) 360 mL 1   leflunomide (ARAVA) 20 MG tablet Take 20 mg by mouth daily.     lidocaine (LIDODERM) 5 % Place 1 patch onto the skin daily. Remove & Discard patch within 12 hours or as directed by MD 30 patch 0   methocarbamol (ROBAXIN) 500 MG tablet Take 1 tablet (500 mg total) by mouth 4 (four) times daily. 60 tablet 0   methylPREDNISolone (MEDROL DOSEPAK) 4 MG TBPK tablet Take as directed per package instructions (Patient not taking: Reported on 01/30/2023) 21 tablet 0   Misc. Devices (WALKER) MISC 1 Device by Does not apply route daily as needed. 1 each 0   oxyCODONE (ROXICODONE) 15 MG immediate release tablet Take 15 mg by mouth 4 (four) times daily as needed for pain.     permethrin (ELIMITE) 5 % cream Apply to affected area once 60 g 0   phenytoin (DILANTIN) 100 MG ER capsule Take 100 mg by mouth 3 (three) times daily.     predniSONE (DELTASONE) 10 MG tablet Take 1 tablet (10 mg total) by mouth daily with breakfast. 21 tablet 0   sucralfate (CARAFATE) 1 GM/10ML suspension Take 10 mLs (1 g total) by mouth with breakfast, with lunch, and with evening meal. 420 mL 1   traZODone (DESYREL) 50 MG tablet TAKE 1/2 TO 1 TABLET BY MOUTH AT BEDTIME AS NEEDED FOR SLEEP. 90 tablet 0   triamcinolone 0.1% oint-Eucerin equivalent cream 1:1 mixture Apply topically 3 (three) times daily. 463 g 3   triamcinolone cream (KENALOG) 0.1 % Apply 1 Application topically 3 (three) times daily. 453.6 g 1   VENTOLIN HFA 108 (90 Base) MCG/ACT inhaler INHALE 2 PUFFS BY MOUTH EVERY 6 HOURS AS NEEDED FOR SHORTNESS OF BREATH OR WHEEZING. 18 g 1   No current facility-administered medications on file prior to visit.    Objective:   Vitals:   01/30/23 1049  BP: 128/83  Pulse: (Abnormal) 101  Resp: 16  SpO2: 97%  Weight: 203 lb 6.4 oz (92.3 kg)    Comprehensive ROS Pertinent positive  and negative noted in HPI   Exam General appearance : Awake, alert, not in any distress. Speech Clear. Not toxic looking HEENT: Atraumatic and Normocephalic, pupils equally reactive to light and accomodation Neck: Supple, no JVD. No cervical lymphadenopathy.  Chest: Good air entry bilaterally, no added sounds  CVS: S1 S2 regular, no murmurs.  Abdomen: Bowel sounds present, Non tender and not distended with no gaurding, rigidity or  rebound. Extremities: B/L Lower Ext shows no edema, both legs are warm to touch Neurology: Awake alert, and oriented X 3, Non focal Skin: No Rash  Data Review Lab Results  Component Value Date   HGBA1C 5.1 10/28/2021   HGBA1C 5.3 04/25/2016    Assessment & Plan   Ronnye was seen today for hypertension.  Diagnoses and all orders for this visit:  Primary insomnia -     traZODone (DESYREL) 50 MG tablet; Take 0.5-1 tablets (25-50 mg total) by mouth at bedtime as needed. for sleep  Lumbar degenerative disc disease -     methocarbamol (ROBAXIN) 500 MG tablet; Take 1 tablet (500 mg total) by mouth every 8 (eight) hours as needed for muscle spasms.  Fall, subsequent encounter See HPI  Essential hypertension Well controlled  BP goal - < 130/80 DIET: Limit salt intake, read nutrition labels to check salt content, limit fried and high fatty foods  Avoid using multisymptom OTC cold preparations that generally contain sudafed which can rise BP. Consult with pharmacist on best cold relief products to use for persons with HTN EXERCISE Discussed incorporating exercise such as walking - 30 minutes most days of the week and can do in 10 minute intervals    - Medication refill -     hydrochlorothiazide (HYDRODIURIL) 25 MG tablet; Take 1 tablet (25 mg total) by mouth daily.      amLODipine (NORVASC) 5 MG tablet; Take 1 tablet (5 mg total) by mouth daily.    Patient have been counseled extensively about nutrition and exercise. Other issues discussed during this  visit include: low cholesterol diet, weight control and daily exercise, foot care, annual eye examinations at Ophthal[facility], importance of adherence with medications and regular follow-up. We also discussed long term complications of uncontrolled diabetes and hypertension.  The patient was given clear instructions to go to ER or return to medical center if symptoms don't improve, worsen or new problems develop. The patient verbalized understanding. The patient was told to call to get lab results if they haven't heard anything in the next week.   This note has been created with Education officer, environmental. Any transcriptional errors are unintentional.   Grayce Sessions, NP 01/30/2023, 11:00 AM

## 2023-02-02 ENCOUNTER — Telehealth: Payer: Self-pay

## 2023-02-02 ENCOUNTER — Encounter (HOSPITAL_COMMUNITY): Payer: Self-pay

## 2023-02-02 ENCOUNTER — Emergency Department (HOSPITAL_COMMUNITY)
Admission: EM | Admit: 2023-02-02 | Discharge: 2023-02-02 | Disposition: A | Discharge status: Home / Self Care | Payer: Medicaid Other | Attending: Emergency Medicine | Admitting: Emergency Medicine

## 2023-02-02 ENCOUNTER — Emergency Department (HOSPITAL_COMMUNITY): Payer: Medicaid Other

## 2023-02-02 ENCOUNTER — Other Ambulatory Visit: Payer: Self-pay

## 2023-02-02 DIAGNOSIS — Z7982 Long term (current) use of aspirin: Secondary | ICD-10-CM | POA: Insufficient documentation

## 2023-02-02 DIAGNOSIS — N3000 Acute cystitis without hematuria: Secondary | ICD-10-CM | POA: Insufficient documentation

## 2023-02-02 DIAGNOSIS — Z79899 Other long term (current) drug therapy: Secondary | ICD-10-CM | POA: Diagnosis not present

## 2023-02-02 DIAGNOSIS — M48061 Spinal stenosis, lumbar region without neurogenic claudication: Secondary | ICD-10-CM | POA: Diagnosis not present

## 2023-02-02 DIAGNOSIS — M5124 Other intervertebral disc displacement, thoracic region: Secondary | ICD-10-CM | POA: Insufficient documentation

## 2023-02-02 DIAGNOSIS — I10 Essential (primary) hypertension: Secondary | ICD-10-CM | POA: Insufficient documentation

## 2023-02-02 DIAGNOSIS — M549 Dorsalgia, unspecified: Secondary | ICD-10-CM | POA: Diagnosis present

## 2023-02-02 LAB — CBC WITH DIFFERENTIAL/PLATELET
Abs Immature Granulocytes: 0.01 10*3/uL (ref 0.00–0.07)
Basophils Absolute: 0 10*3/uL (ref 0.0–0.1)
Basophils Relative: 1 %
Eosinophils Absolute: 0.1 10*3/uL (ref 0.0–0.5)
Eosinophils Relative: 4 %
HCT: 39.6 % (ref 36.0–46.0)
Hemoglobin: 12.9 g/dL (ref 12.0–15.0)
Immature Granulocytes: 0 %
Lymphocytes Relative: 22 %
Lymphs Abs: 0.8 10*3/uL (ref 0.7–4.0)
MCH: 30.2 pg (ref 26.0–34.0)
MCHC: 32.6 g/dL (ref 30.0–36.0)
MCV: 92.7 fL (ref 80.0–100.0)
Monocytes Absolute: 0.5 10*3/uL (ref 0.1–1.0)
Monocytes Relative: 13 %
Neutro Abs: 2.1 10*3/uL (ref 1.7–7.7)
Neutrophils Relative %: 60 %
Platelets: 133 10*3/uL — ABNORMAL LOW (ref 150–400)
RBC: 4.27 MIL/uL (ref 3.87–5.11)
RDW: 13.6 % (ref 11.5–15.5)
WBC: 3.5 10*3/uL — ABNORMAL LOW (ref 4.0–10.5)
nRBC: 0 % (ref 0.0–0.2)

## 2023-02-02 LAB — URINALYSIS, ROUTINE W REFLEX MICROSCOPIC
Bilirubin Urine: NEGATIVE
Glucose, UA: NEGATIVE mg/dL
Ketones, ur: NEGATIVE mg/dL
Nitrite: NEGATIVE
Protein, ur: NEGATIVE mg/dL
Specific Gravity, Urine: 1.017 (ref 1.005–1.030)
pH: 5 (ref 5.0–8.0)

## 2023-02-02 LAB — COMPREHENSIVE METABOLIC PANEL
ALT: 36 U/L (ref 0–44)
AST: 42 U/L — ABNORMAL HIGH (ref 15–41)
Albumin: 3.3 g/dL — ABNORMAL LOW (ref 3.5–5.0)
Alkaline Phosphatase: 117 U/L (ref 38–126)
Anion gap: 8 (ref 5–15)
BUN: 7 mg/dL (ref 6–20)
CO2: 27 mmol/L (ref 22–32)
Calcium: 9 mg/dL (ref 8.9–10.3)
Chloride: 103 mmol/L (ref 98–111)
Creatinine, Ser: 0.88 mg/dL (ref 0.44–1.00)
GFR, Estimated: >60 mL/min (ref >60)
Glucose, Bld: 96 mg/dL (ref 70–99)
Potassium: 3.2 mmol/L — ABNORMAL LOW (ref 3.5–5.1)
Sodium: 138 mmol/L (ref 135–145)
Total Bilirubin: 0.9 mg/dL (ref 0.3–1.2)
Total Protein: 6.4 g/dL — ABNORMAL LOW (ref 6.5–8.1)

## 2023-02-02 LAB — LIPASE, BLOOD: Lipase: 29 U/L (ref 11–51)

## 2023-02-02 MED ORDER — SODIUM CHLORIDE 0.9 % IV BOLUS
500.0000 mL | Freq: Once | INTRAVENOUS | Status: AC
  Administered 2023-02-02: 500 mL via INTRAVENOUS

## 2023-02-02 MED ORDER — DIAZEPAM 5 MG PO TABS
5.0000 mg | ORAL_TABLET | Freq: Once | ORAL | Status: AC
  Administered 2023-02-02: 5 mg via ORAL
  Filled 2023-02-02: qty 1

## 2023-02-02 MED ORDER — IOHEXOL 350 MG/ML SOLN
75.0000 mL | Freq: Once | INTRAVENOUS | Status: AC | PRN
  Administered 2023-02-02: 75 mL via INTRAVENOUS

## 2023-02-02 MED ORDER — DICLOFENAC SODIUM 1 % EX GEL: 2.0000 g | Freq: Four times a day (QID) | CUTANEOUS | 0 refills | Status: DC

## 2023-02-02 MED ORDER — SODIUM CHLORIDE 0.9 % IV SOLN
1.0000 g | Freq: Once | INTRAVENOUS | Status: AC
  Administered 2023-02-02: 1 g via INTRAVENOUS
  Filled 2023-02-02: qty 10

## 2023-02-02 MED ORDER — OXYCODONE HCL 5 MG PO TABS
15.0000 mg | ORAL_TABLET | Freq: Once | ORAL | Status: AC
  Administered 2023-02-02: 15 mg via ORAL
  Filled 2023-02-02: qty 3

## 2023-02-02 MED ORDER — POTASSIUM CHLORIDE CRYS ER 20 MEQ PO TBCR
40.0000 meq | EXTENDED_RELEASE_TABLET | Freq: Once | ORAL | Status: AC
  Administered 2023-02-02: 40 meq via ORAL
  Filled 2023-02-02: qty 2

## 2023-02-02 MED ORDER — NITROFURANTOIN MONOHYD MACRO 100 MG PO CAPS: 100.0000 mg | ORAL_CAPSULE | Freq: Two times a day (BID) | ORAL | 0 refills | Status: DC

## 2023-02-02 MED ORDER — LIDOCAINE 5 % EX OINT: 1.0000 | TOPICAL_OINTMENT | CUTANEOUS | 0 refills | Status: DC | PRN

## 2023-02-02 MED ORDER — ACETAMINOPHEN 500 MG PO TABS
1000.0000 mg | ORAL_TABLET | Freq: Once | ORAL | Status: AC
  Administered 2023-02-02: 1000 mg via ORAL
  Filled 2023-02-02: qty 2

## 2023-02-02 MED ORDER — KETOROLAC TROMETHAMINE 15 MG/ML IJ SOLN
15.0000 mg | Freq: Once | INTRAMUSCULAR | Status: AC
  Administered 2023-02-02: 15 mg via INTRAVENOUS
  Filled 2023-02-02: qty 1

## 2023-02-02 NOTE — Telephone Encounter (Signed)
This RNCM received call from patient inquiring why the EDP did not indicate a fall on her paperwork. This RNCM advise patient that the MD will document based on the diagnosis she was treated for. This RNCM advised patient her c/o of fall at [facility] in 12/19/22 was documented. Then patient reports the pharmacy advised that her insurance does not cover the lidocaine ointment. Patient reports "the lidocaine patches do not stay on her back" . This RNCM spoke with [facility] who reports the lidocaine ointment on hand is not covered by insurance and patient can get OTC for $10.99. This RNCM also notified EDP Theresia Lo who reports she can try prescribing diclofenac ointment.

## 2023-02-02 NOTE — ED Notes (Signed)
Walked patient to the bathroom patient did well 

## 2023-02-02 NOTE — ED Provider Notes (Signed)
[facility] EMERGENCY DEPARTMENT AT [facility] Provider Note   CSN: [id number] Arrival date & time: 02/02/23  4098     History  Chief Complaint  Patient presents with   Back Pain    Heather Perry is a 57 y.o. female.  Patient is a 57 year old female with a past medical history of chronic back pain, Sjogren's, hypertension presenting to the emergency department with worsening back pain.  The patient states that she had a fall on July 1 and has been managing worsening back pain since then.  She was seen in the ED and had a negative CT of her lumbar spine at that time and has been following with pain management.  She states that when she got up yesterday morning her pain significantly worsened and radiated from her neck all the way down to her back and into her arms and legs.  She states that she took her home pain medication which would improve her pain for a few hours and as it wore off her pain would get worse.  She states that she also started to develop lower abdominal pain last night and diarrhea.  She denies any black or bloody stools.  She denies any bowel or bladder incontinence or saddle anesthesia.  She denies any numbness or weakness.  The patient also reports last night that her throat started to feel sore and very dry.  She states that she tried to drink more water without any improvement and took a home COVID test that was negative.  The history is provided by the patient.  Back Pain      Home Medications Prior to Admission medications   Medication Sig Start Date End Date Taking? Authorizing Provider  lidocaine (XYLOCAINE) 5 % ointment Apply 1 Application topically as needed. 02/02/23  Yes Theresia Lo, [place] K, DO  nitrofurantoin, macrocrystal-monohydrate, (MACROBID) 100 MG capsule Take 1 capsule (100 mg total) by mouth 2 (two) times daily. 02/02/23  Yes Kingsley, Benetta Spar K, DO  AMITIZA 24 MCG capsule TAKE 1 CAPSULE (24 MCG TOTAL) BY MOUTH 2 (TWO) TIMES DAILY WITH A  MEAL. 03/11/21   Raulkar, Drema Pry, MD  amLODipine (NORVASC) 5 MG tablet Take 1 tablet (5 mg total) by mouth daily. 01/30/23   Grayce Sessions, NP  aspirin EC 81 MG tablet Take 81 mg by mouth daily.    [provider]  atorvastatin (LIPITOR) 20 MG tablet TAKE ONE TABLET BY MOUTH ONCE DAILY FOR CHOLESTEROL 08/29/22   Grayce Sessions, NP  benzonatate (TESSALON) 100 MG capsule Take 1 capsule (100 mg total) by mouth 2 (two) times daily as needed for cough. Patient not taking: Reported on 01/30/2023 10/12/22   Grayce Sessions, NP  budesonide-formoterol [facility]) 160-4.5 MCG/ACT inhaler Inhale 2 puffs into the lungs 2 (two) times daily. Patient taking differently: Inhale 2 puffs into the lungs 2 (two) times daily as needed (wheezing, sob). 05/18/16   Henrietta Hoover, NP  celecoxib (CELEBREX) 100 MG capsule Take 1 capsule (100 mg total) by mouth 2 (two) times daily. 08/29/22   Grayce Sessions, NP  cetirizine (ZYRTEC) 10 MG tablet TAKE ONE TABLET BY MOUTH ONCE DAILY FOR ALLERGIES 12/21/22   Grayce Sessions, NP  clonazePAM (KLONOPIN) 1 MG tablet Take 0.5 mg by mouth 2 (two) times daily as needed for anxiety. 12/15/21   [provider]  dicyclomine (BENTYL) 20 MG tablet Take 1 tablet (20 mg total) by mouth 2 (two) times daily. 07/31/22   Alphia Kava,  MD  esomeprazole (NEXIUM) 40 MG capsule Take one capsule as needed for indigestion 08/29/22   Grayce Sessions, NP  fluticasone (FLONASE) 50 MCG/ACT nasal spray PLACE 2 SPRAYS INTO BOTH NOSTRILS DAILY. 01/20/23   Grayce Sessions, NP  gabapentin (NEURONTIN) 300 MG capsule Take 300 mg by mouth 3 (three) times daily. 12/24/21   [provider]  hydrochlorothiazide (HYDRODIURIL) 25 MG tablet Take 1 tablet (25 mg total) by mouth daily. 01/30/23   Grayce Sessions, NP  hydroxychloroquine (PLAQUENIL) 200 MG tablet Take 400 mg by mouth daily. 03/21/17   [provider]  hydroxypropyl methylcellulose /  hypromellose (ISOPTO TEARS / GONIOVISC) 2.5 % ophthalmic solution Place 1 drop into both eyes 3 (three) times daily as needed for dry eyes. 02/11/22   Grayce Sessions, NP  ipratropium-albuterol (DUONEB) 0.5-2.5 (3) MG/3ML SOLN Take 3 mLs by nebulization every 6 (six) hours as needed. Patient taking differently: Take 3 mLs by nebulization every 6 (six) hours as needed (for SOB). 05/18/16   Henrietta Hoover, NP  leflunomide (ARAVA) 20 MG tablet Take 20 mg by mouth daily. 11/11/19   [provider]  lidocaine (LIDODERM) 5 % Place 1 patch onto the skin daily. Remove & Discard patch within 12 hours or as directed by MD 04/05/22   Alvira Monday, MD  methocarbamol (ROBAXIN) 500 MG tablet Take 1 tablet (500 mg total) by mouth every 8 (eight) hours as needed for muscle spasms. 01/30/23   Grayce Sessions, NP  methylPREDNISolone (MEDROL DOSEPAK) 4 MG TBPK tablet Take as directed per package instructions Patient not taking: Reported on 01/30/2023 12/31/22   Darrick Grinder, PA-C  Misc. Devices (WALKER) MISC 1 Device by Does not apply route daily as needed. 07/03/21   Rhys Martini, PA-C  oxyCODONE (ROXICODONE) 15 MG immediate release tablet Take 15 mg by mouth 4 (four) times daily as needed for pain. 12/29/21   [provider]  permethrin (ELIMITE) 5 % cream Apply to affected area once 06/18/22   Fayrene Helper, PA-C  phenytoin (DILANTIN) 100 MG ER capsule Take 100 mg by mouth 3 (three) times daily.    [provider]  predniSONE (DELTASONE) 10 MG tablet Take 1 tablet (10 mg total) by mouth daily with breakfast. 01/09/23   Anders Simmonds, PA-C  sucralfate (CARAFATE) 1 GM/10ML suspension Take 10 mLs (1 g total) by mouth with breakfast, with lunch, and with evening meal. 12/06/22   Gloris Manchester, MD  traZODone (DESYREL) 50 MG tablet Take 0.5-1 tablets (25-50 mg total) by mouth at bedtime as needed. for sleep 01/30/23   Grayce Sessions, NP  triamcinolone 0.1% oint-Eucerin  equivalent cream 1:1 mixture Apply topically 3 (three) times daily. 05/18/22   Grayce Sessions, NP  triamcinolone cream (KENALOG) 0.1 % Apply 1 Application topically 3 (three) times daily. 05/18/22   Grayce Sessions, NP  VENTOLIN HFA 108 (90 Base) MCG/ACT inhaler INHALE 2 PUFFS BY MOUTH EVERY 6 HOURS AS NEEDED FOR SHORTNESS OF BREATH OR WHEEZING. 12/13/22   Grayce Sessions, NP      Allergies    Hydrocodone-acetaminophen, Lortab [hydrocodone-acetaminophen], Niacin, Penicillin g, and Penicillins    Review of Systems   Review of Systems  Musculoskeletal:  Positive for back pain.    Physical Exam Updated Vital Signs BP (!) 141/84 (BP Location: Right Arm)   Pulse 100   Temp 98.8 F (37.1 C) (Oral)   Resp 20   SpO2 100%  Physical Exam Vitals  and nursing note reviewed.  Constitutional:      Appearance: She is diaphoretic (mildly, forehead). She is not toxic-appearing.     Comments: Uncomfortable appearing  HENT:     Head: Normocephalic and atraumatic.     Nose: Nose normal.     Mouth/Throat:     Mouth: Mucous membranes are moist.     Pharynx: Posterior oropharyngeal erythema (posterior oropharynx) present.     Comments: Normal phonation, no tonsillar swelling or exudates, uvula midline, no trismus Eyes:     Extraocular Movements: Extraocular movements intact.     Conjunctiva/sclera: Conjunctivae normal.  Cardiovascular:     Rate and Rhythm: Regular rhythm. Tachycardia present.     Pulses: Normal pulses.     Heart sounds: Normal heart sounds.  Pulmonary:     Effort: Pulmonary effort is normal.     Breath sounds: Normal breath sounds.  Abdominal:     General: Abdomen is flat.     Palpations: Abdomen is soft.     Tenderness: There is no abdominal tenderness. There is no right CVA tenderness or left CVA tenderness.  Musculoskeletal:        General: Normal range of motion.     Cervical back: Normal range of motion.     Comments: No midline back tenderness, proximal  upper lumbar paraspinal muscle tenderness palpation bilaterally Negative straight leg raise bilaterally  Neurological:     General: No focal deficit present.     Mental Status: She is alert and oriented to person, place, and time.     Sensory: No sensory deficit (Paresthesias to plantar surface of the left foot).     Motor: No weakness (5 out of 5 strength in bilateral upper and lower extremities).  Psychiatric:        Mood and Affect: Mood normal.        Behavior: Behavior normal.     ED Results / Procedures / Treatments   Labs (all labs ordered are listed, but only abnormal results are displayed) Labs Reviewed  COMPREHENSIVE METABOLIC PANEL - Abnormal; Notable for the following components:      Result Value   Potassium 3.2 (*)    Total Protein 6.4 (*)    Albumin 3.3 (*)    AST 42 (*)    All other components within normal limits  CBC WITH DIFFERENTIAL/PLATELET - Abnormal; Notable for the following components:   WBC 3.5 (*)    Platelets 133 (*)    All other components within normal limits  URINALYSIS, ROUTINE W REFLEX MICROSCOPIC - Abnormal; Notable for the following components:   Color, Urine AMBER (*)    APPearance CLOUDY (*)    Hgb urine dipstick SMALL (*)    Leukocytes,Ua LARGE (*)    Bacteria, UA MANY (*)    All other components within normal limits  LIPASE, BLOOD    EKG None  Radiology CT L-SPINE NO CHARGE  Result Date: 02/02/2023 CLINICAL DATA:  Back pain EXAM: CT LUMBAR SPINE WITHOUT CONTRAST TECHNIQUE: Multidetector CT imaging of the lumbar spine was performed without intravenous contrast administration. Multiplanar CT image reconstructions were also generated. RADIATION DOSE REDUCTION: This exam was performed according to the departmental dose-optimization program which includes automated exposure control, adjustment of the mA and/or kV according to patient size and/or use of iterative reconstruction technique. COMPARISON:  CT L Spine 12/19/22 FINDINGS:  Segmentation: 5 lumbar type vertebrae. Alignment: Grade 1 anterolisthesis of L4 on L5. Trace retrolisthesis of L3 on L4. Vertebrae: No acute fracture  or focal pathologic process. Paraspinal and other soft tissues: See separately dictated CT chest abdomen and pelvis for visceral findings. Disc levels: Severe bilateral neural foraminal stenosis at L4-L5 and L5-S1. Moderate to severe spinal canal narrowing at L4-L5. IMPRESSION: 1. No acute fracture or traumatic listhesis. 2. Severe bilateral neural foraminal stenosis at L4-L5 and L5-S1. Moderate to severe spinal canal narrowing at L4-L5. Electronically Signed   By: Lorenza Cambridge M.D.   On: 02/02/2023 13:24   CT Angio Chest/Abd/Pel for Dissection W and/or W/WO  Result Date: 02/02/2023 CLINICAL DATA:  Acute aortic syndrome. EXAM: CT ANGIOGRAPHY CHEST, ABDOMEN AND PELVIS TECHNIQUE: Non-contrast CT of the chest was initially obtained. Multidetector CT imaging through the chest, abdomen and pelvis was performed using the standard protocol during bolus administration of intravenous contrast. Multiplanar reconstructed images and MIPs were obtained and reviewed to evaluate the vascular anatomy. RADIATION DOSE REDUCTION: This exam was performed according to the departmental dose-optimization program which includes automated exposure control, adjustment of the mA and/or kV according to patient size and/or use of iterative reconstruction technique. CONTRAST:  75mL OMNIPAQUE IOHEXOL 350 MG/ML SOLN COMPARISON:  CT angiogram chest abdomen pelvis 12/06/2022 FINDINGS: CTA CHEST FINDINGS Cardiovascular: No abnormal high density appearance to the thoracic aorta. The thoracic aorta has some pulsation artifact along the ascending aorta but no mediastinal hematoma. No dissection or aneurysm formation. The origin of the great vessels are preserved. Diameter of the ascending aorta at the level of the right pulmonary artery approaches 2.7 cm. The descending thoracic aorta same level has a  diameter approaching 2.0 cm. Diameter of the very proximal ascending aorta on coronal imaging has a diameter of 2.1 cm in the distal aortic arch 2.2 cm. Minimal atherosclerotic plaque. Heart is nonenlarged. No pericardial effusion. Mediastinum/Nodes: Slightly patulous thoracic esophagus. Heterogeneous and small thyroid gland. No specific abnormal lymph node enlargement identified in the axillary regions, hilum. No clear abnormal lymph node enlargement identified in the mediastinum. Lungs/Pleura: There is some peripheral interstitial areas of thickening bilaterally with some pleural thickening. No pleural effusion, pneumothorax or consolidation. There are some tiny lung nodules identified such as right lower lobe, series 7, image 76 measuring 3 mm. Similar foci in the right lung on series 7, image 71. These are unchanged from previous examination. Musculoskeletal: Scattered degenerative changes of the spine. Particularly with areas of disc height loss with sclerosis and osteophytes identified at T1-2 and T10-11 where there is trace listhesis at these levels. Immediately left of the disc space at T9-10 is a small rounded soft tissue nodule on axial series 6, image 102 measuring 11 by 12 mm. This has developed since the prior examination. There is only a small amount of tissue in this location on the prior examination. Review of the MIP images confirms the above findings. CTA ABDOMEN AND PELVIS FINDINGS VASCULAR Aorta: Normal caliber aorta without aneurysm, dissection, vasculitis or significant stenosis. Celiac: Patent without evidence of aneurysm, dissection, vasculitis or significant stenosis. SMA: Patent without evidence of aneurysm, dissection, vasculitis or significant stenosis. Renals: Both renal arteries are patent without evidence of aneurysm, dissection, vasculitis, fibromuscular dysplasia or significant stenosis. Small accessory right-sided renal artery as well. IMA: Patent without evidence of aneurysm,  dissection, vasculitis or significant stenosis. Inflow: Patent without evidence of aneurysm, dissection, vasculitis or significant stenosis. Veins: No obvious venous abnormality within the limitations of this arterial phase study. Review of the MIP images confirms the above findings. NON-VASCULAR Hepatobiliary: Fatty liver infiltration noted on this arterial phase examination. Gallbladder is  nondilated. Pancreas: Unremarkable. No pancreatic ductal dilatation or surrounding inflammatory changes. Spleen: Normal in size without focal abnormality. Adrenals/Urinary Tract: Adrenal glands are unremarkable. Kidneys are normal, without renal calculi, focal lesion, or hydronephrosis. Bladder is unremarkable. There are some tiny low-attenuation lesions along each kidney which are too small to completely characterize but likely benign cysts, Bosniak 2 lesions. No specific imaging follow-up. Stomach/Bowel: Posterior fundal gastric diverticulum. The stomach, small bowel and large bowel are nondilated there are scattered colonic stool. Normal appendix in the right lower quadrant few sigmoid colon diverticula Lymphatic: Specific abnormal lymph node enlargement identified in the abdomen Reproductive: Status post hysterectomy. No adnexal masses. Other: No free air or free fluid rectus muscle diastasis with protuberance of the anterior abdominal wall at the umbilicus. Musculoskeletal: Degenerative changes seen along the spine and pelvis. There is also areas of stenosis with anterolisthesis of L4 on L5. Review of the MIP images confirms the above findings. IMPRESSION: Slight atherosclerotic changes. No dissection or aneurysm formation. Once again there is a patulous esophagus. Please correlate with any symptoms. Developing small soft tissue nodule to the left of the spine at the T9-10 level. This has a differential including isolated lymph node or disc protrusion amongst others. Please correlate for specific neurologic symptom. Would  recommend either follow up MRI with and without contrast of the thoracic spine or follow up imaging in 3 months Fatty liver infiltration. Multilevel degenerative changes of the spine with stenosis Electronically Signed   By: Karen Kays M.D.   On: 02/02/2023 12:57    Procedures Procedures    Medications Ordered in ED Medications  acetaminophen (TYLENOL) tablet 1,000 mg (1,000 mg Oral Given 02/02/23 0846)  ketorolac (TORADOL) 15 MG/ML injection 15 mg (15 mg Intravenous Given 02/02/23 0847)  diazepam (VALIUM) tablet 5 mg (5 mg Oral Given 02/02/23 0846)  potassium chloride SA (KLOR-CON M) CR tablet 40 mEq (40 mEq Oral Given 02/02/23 1016)  sodium chloride 0.9 % bolus 500 mL (0 mLs Intravenous Stopped 02/02/23 1148)  oxyCODONE (Oxy IR/ROXICODONE) immediate release tablet 15 mg (15 mg Oral Given 02/02/23 1103)  iohexol (OMNIPAQUE) 350 MG/ML injection 75 mL (75 mLs Intravenous Contrast Given 02/02/23 1101)  cefTRIAXone (ROCEPHIN) 1 g in sodium chloride 0.9 % 100 mL IVPB (0 g Intravenous Stopped 02/02/23 1222)    ED Course/ Medical Decision Making/ A&P Clinical Course as of 02/02/23 1408  Thu Feb 02, 2023  1118 Large leuks with many bacteria consistent with UTI. Will be started on Ceftriaxone pending CT. [VK]  1301 Possible thoracic disc protrusion, no associated neurologic deficits on exam today and recommended outpatient follow up for further evaluation. [VK]  1344 Patient has severe neuraminal and moderate to severe stenosis of the L-spine. She will be recommended outpatient neurosurgery follow up for further management of her back pain. [VK]    Clinical Course User Index [VK] Rexford Maus, DO                                 Medical Decision Making This patient presents to the ED with chief complaint(s) of back pain with pertinent past medical history of lumbar DDD, Sjogren's, hypertension which further complicates the presenting complaint. The complaint involves an extensive differential  diagnosis and also carries with it a high risk of complications and morbidity.    The differential diagnosis includes muscle strain or spasm, have negative CT at time of injury and no new falls  making a fracture dislocation unlikely, considering possible lytic or other lesion due to chronicity of pain, with associated abdominal pain and diarrhea considering possible dissection, but urinary symptoms making UTI or pyelonephritis less likely, no focal logic deficits, saddle anesthesia or loss of bowel or bladder function making cauda equina unlikely  Additional history obtained: Additional history obtained from N/A Records reviewed Primary Care Documents and pain management records  ED Course and Reassessment: On patient's arrival to the emergency department she was mildly tachycardic, diaphoretic and uncomfortable appearing however without neurologic deficits and otherwise hemodynamically stable.  Patient will have workup including labs and CT dissection and lumbar spine and will be treated for muscle spasm with Tylenol, Valium and Toradol and will be closely reassessed.  Independent labs interpretation:  The following labs were independently interpreted: UA positive for UTI, mild hypokalemia  Independent visualization of imaging: - I independently visualized the following imaging with scope of interpretation limited to determining acute life threatening conditions related to emergency care: CT dissection, which revealed possible thoracic disc protrusion, lumbar spinal stenosis and foraminal stenosis, no other acute disease  Consultation: - Consulted or discussed management/test interpretation w/ external professional: N/A  Consideration for admission or further workup: Patient has no emergent conditions requiring admission or further work-up at this time and is stable for discharge home with primary care/spine surgeon/pain management follow-up  Social Determinants of health: N/A    Amount  and/or Complexity of Data Reviewed Labs: ordered. Radiology: ordered.  Risk OTC drugs. Prescription drug management.          Final Clinical Impression(s) / ED Diagnoses Final diagnoses:  Acute cystitis without hematuria  Lumbar foraminal stenosis  Thoracic disc herniation    Rx / DC Orders ED Discharge Orders          Ordered    nitrofurantoin, macrocrystal-monohydrate, (MACROBID) 100 MG capsule  2 times daily        02/02/23 1404    lidocaine (XYLOCAINE) 5 % ointment  As needed        02/02/23 1405              Rexford Maus, DO 02/02/23 1408

## 2023-02-02 NOTE — Discharge Instructions (Signed)
You were seen in the emergency department for your back pain and your diarrhea.  You had no signs of infection in your belly but do have a urinary tract infection and we have given you antibiotics you should complete this as prescribed.  You do have arthritis and narrowing of the bones in your back and you should follow-up with your back doctor for evaluation.  You also have a possible slipped disc in your thoracic spine and radiology recommended that you have repeat imaging with an MRI with 3 months and you can talk to your primary doctor or back surgeon about getting this scheduled.  You can continue to take your home pain medications as prescribed and avoid heavy lifting over 10 pounds until you are cleared by your doctor.  You should return to the emergency department if you develop numbness or weakness in your legs, you are unable to walk, you are unable to urinate or if you have any other new or concerning symptoms.

## 2023-02-02 NOTE — ED Triage Notes (Signed)
Pt BIB EMS. Larey Seat in [place] on July 1st. She does have chronic back pain but now since after the fell she is having increased back pain. She states that she is also having the sensation of something in her throat since last night.   Oxy, Flexeril, and Gab at 3am this morning  190/100 116 98% CBG 111  Patient states the pain radiates up her spine and down bilateral arms. Also patient states that she has been having diarrhea since yesterday due to food she ate yesterday.

## 2023-02-07 ENCOUNTER — Encounter (INDEPENDENT_AMBULATORY_CARE_PROVIDER_SITE_OTHER): Payer: Self-pay | Admitting: Primary Care

## 2023-02-07 ENCOUNTER — Ambulatory Visit (INDEPENDENT_AMBULATORY_CARE_PROVIDER_SITE_OTHER): Payer: Medicaid Other | Admitting: Primary Care

## 2023-02-07 VITALS — BP 123/79 | HR 101 | Resp 16 | Wt 205.6 lb

## 2023-02-07 DIAGNOSIS — Z09 Encounter for follow-up examination after completed treatment for conditions other than malignant neoplasm: Secondary | ICD-10-CM | POA: Diagnosis not present

## 2023-02-07 DIAGNOSIS — M5440 Lumbago with sciatica, unspecified side: Secondary | ICD-10-CM | POA: Diagnosis not present

## 2023-02-07 DIAGNOSIS — G8929 Other chronic pain: Secondary | ICD-10-CM | POA: Diagnosis not present

## 2023-02-07 NOTE — Progress Notes (Signed)
[facility]   Subjective:   Heather Perry is a 57 y.o. female presents for ED follow up on 02/02/23, for back pain located thoracic to lumbar. She has a new patient appt tomorrow with a spine specialist. Today she rates her pain as 5/10 she recently had to take pain medication to deescalate the 10/10 pain. She also c/o headaches frontal intermittent but at least 3-7 times a week. She has found that muscle relaxer's relieve the pain. Patient has , No chest pain, No abdominal pain - No Nausea, No new weakness tingling or numbness, No Cough - shortness of breath   Past Medical History:  Diagnosis Date   Allergy    Anxiety    Arthritis    Asthma    Blood transfusion without reported diagnosis    Depression    GERD (gastroesophageal reflux disease)    High cholesterol    Hypertension    Obesity    Osteoporosis    Recurrent boils    Seizures (HCC)    last seizure "3 years ago" per pt 06-16-16 KBW   Sleep apnea    no CPAP used   Uterine fibroid      Allergies  Allergen Reactions   Hydrocodone-Acetaminophen Itching    Started itching.  Pt states that she can take this medication Other reaction(s): Other (See Comments) Pruritus   Lortab [Hydrocodone-Acetaminophen]     Started itching   Niacin Hives   Penicillin G Hives and Nausea And Vomiting    Did it involve swelling of the face/tongue/throat, SOB, or low BP? No Did it involve sudden or severe rash/hives, skin peeling, or any reaction on the inside of your mouth or nose? Yes Did you need to seek medical attention at a hospital or doctor's office? No When did it last happen?    Over 20 years Ago   If all above answers are "NO", may proceed with cephalosporin use.     Penicillins Itching      Current Outpatient Medications on File Prior to Visit  Medication Sig Dispense Refill   AMITIZA 24 MCG capsule TAKE 1 CAPSULE (24 MCG TOTAL) BY MOUTH 2 (TWO) TIMES DAILY WITH A MEAL. 60 capsule 1   amLODipine (NORVASC)  5 MG tablet Take 1 tablet (5 mg total) by mouth daily. 90 tablet 1   aspirin EC 81 MG tablet Take 81 mg by mouth daily.     atorvastatin (LIPITOR) 20 MG tablet TAKE ONE TABLET BY MOUTH ONCE DAILY FOR CHOLESTEROL 90 tablet 1   benzonatate (TESSALON) 100 MG capsule Take 1 capsule (100 mg total) by mouth 2 (two) times daily as needed for cough. (Patient not taking: Reported on 01/30/2023) 20 capsule 0   budesonide-formoterol (SYMBICORT) 160-4.5 MCG/ACT inhaler Inhale 2 puffs into the lungs 2 (two) times daily. (Patient taking differently: Inhale 2 puffs into the lungs 2 (two) times daily as needed (wheezing, sob).) 1 Inhaler 1   celecoxib (CELEBREX) 100 MG capsule Take 1 capsule (100 mg total) by mouth 2 (two) times daily. 180 capsule 1   cetirizine (ZYRTEC) 10 MG tablet TAKE ONE TABLET BY MOUTH ONCE DAILY FOR ALLERGIES 90 tablet 1   clonazePAM (KLONOPIN) 1 MG tablet Take 0.5 mg by mouth 2 (two) times daily as needed for anxiety.     diclofenac Sodium (VOLTAREN) 1 % GEL Apply 2 g topically 4 (four) times daily. 100 g 0   dicyclomine (BENTYL) 20 MG tablet Take 1 tablet (20 mg total) by mouth 2 (  two) times daily. 20 tablet 0   esomeprazole (NEXIUM) 40 MG capsule Take one capsule as needed for indigestion 90 capsule 1   fluticasone (FLONASE) 50 MCG/ACT nasal spray PLACE 2 SPRAYS INTO BOTH NOSTRILS DAILY. 16 g 0   gabapentin (NEURONTIN) 300 MG capsule Take 300 mg by mouth 3 (three) times daily.     hydrochlorothiazide (HYDRODIURIL) 25 MG tablet Take 1 tablet (25 mg total) by mouth daily. 90 tablet 3   hydroxychloroquine (PLAQUENIL) 200 MG tablet Take 400 mg by mouth daily.     hydroxypropyl methylcellulose / hypromellose (ISOPTO TEARS / GONIOVISC) 2.5 % ophthalmic solution Place 1 drop into both eyes 3 (three) times daily as needed for dry eyes. 15 mL 3   ipratropium-albuterol (DUONEB) 0.5-2.5 (3) MG/3ML SOLN Take 3 mLs by nebulization every 6 (six) hours as needed. (Patient taking differently: Take 3 mLs by  nebulization every 6 (six) hours as needed (for SOB).) 360 mL 1   leflunomide (ARAVA) 20 MG tablet Take 20 mg by mouth daily.     lidocaine (LIDODERM) 5 % Place 1 patch onto the skin daily. Remove & Discard patch within 12 hours or as directed by MD 30 patch 0   lidocaine (XYLOCAINE) 5 % ointment Apply 1 Application topically as needed. 35.44 g 0   methocarbamol (ROBAXIN) 500 MG tablet Take 1 tablet (500 mg total) by mouth every 8 (eight) hours as needed for muscle spasms. 120 tablet 1   methylPREDNISolone (MEDROL DOSEPAK) 4 MG TBPK tablet Take as directed per package instructions (Patient not taking: Reported on 01/30/2023) 21 tablet 0   Misc. Devices (WALKER) MISC 1 Device by Does not apply route daily as needed. 1 each 0   nitrofurantoin, macrocrystal-monohydrate, (MACROBID) 100 MG capsule Take 1 capsule (100 mg total) by mouth 2 (two) times daily. 10 capsule 0   oxyCODONE (ROXICODONE) 15 MG immediate release tablet Take 15 mg by mouth 4 (four) times daily as needed for pain.     permethrin (ELIMITE) 5 % cream Apply to affected area once 60 g 0   phenytoin (DILANTIN) 100 MG ER capsule Take 100 mg by mouth 3 (three) times daily.     predniSONE (DELTASONE) 10 MG tablet Take 1 tablet (10 mg total) by mouth daily with breakfast. 21 tablet 0   sucralfate (CARAFATE) 1 GM/10ML suspension Take 10 mLs (1 g total) by mouth with breakfast, with lunch, and with evening meal. 420 mL 1   traZODone (DESYREL) 50 MG tablet Take 0.5-1 tablets (25-50 mg total) by mouth at bedtime as needed. for sleep 90 tablet 0   triamcinolone 0.1% oint-Eucerin equivalent cream 1:1 mixture Apply topically 3 (three) times daily. 463 g 3   triamcinolone cream (KENALOG) 0.1 % Apply 1 Application topically 3 (three) times daily. 453.6 g 1   VENTOLIN HFA 108 (90 Base) MCG/ACT inhaler INHALE 2 PUFFS BY MOUTH EVERY 6 HOURS AS NEEDED FOR SHORTNESS OF BREATH OR WHEEZING. 18 g 1   No current facility-administered medications on file prior  to visit.   Review of System: Comprehensive ROS Pertinent positive and negative noted in HPI    Objective:  Blood Pressure 123/79   Pulse (Abnormal) 101   Respiration 16   Weight 205 lb 9.6 oz (93.3 kg)   Oxygen Saturation 97%   Body Mass Index 37.60 kg/m   Filed Weights   02/07/23 1453  Weight: 205 lb 9.6 oz (93.3 kg)    Physical Exam: General Appearance: Well nourished, obese female  in mild distress. Eyes: PERRLA, EOMs, conjunctiva no swelling or erythema Neck: Supple, thyroid normal.  Respiratory: Respiratory effort normal, BS equal bilaterally without rales, rhonchi, wheezing or stridor.  Cardio: RRR with no MRGs. Brisk peripheral pulses without edema.  Abdomen: Soft, + BS.  Non tender, no guarding, rebound, hernias, masses. Lymphatics: Non tender without lymphadenopathy.  Musculoskeletal: right arm < rom left full rom  strength, normal gait.  Skin: Warm, dry without rashes, lesions, ecchymosis.  Neuro: Cranial nerves intact. Normal muscle tone, no cerebellar symptoms. Sensation intact.  Psych: Awake and oriented X 3, normal affect, Insight and Judgment appropriate.    Assessment:  Heather Perry was seen today for hospitalization follow-up.  Diagnoses and all orders for this visit:  Hospital discharge follow-up 2/2 Chronic bilateral low back pain with sciatica, sciatica laterality unspecified F/u for subsequent  fall and back pain. Appt tomorrow with Spine specialist.   This note has been created with Dragon speech recognition software and smart phrase technology. Any transcriptional errors are unintentional.   Grayce Sessions, NP 02/07/2023, 3:25 PM

## 2023-02-08 ENCOUNTER — Telehealth: Payer: Self-pay

## 2023-02-08 NOTE — Telephone Encounter (Signed)
PCS referral and office visit note faxed to [facility] CACs: [phone]

## 2023-02-10 NOTE — Telephone Encounter (Signed)
Noted  

## 2023-02-22 ENCOUNTER — Telehealth: Payer: Self-pay

## 2023-02-22 NOTE — Telephone Encounter (Signed)
Message received from Lori/ [facility] requesting a call back : [phone].  Call returned to [facility] and message was left with call back requested.

## 2023-02-23 NOTE — Telephone Encounter (Signed)
Call received from Toni/ [facility]. She explained that they spoke to the patient once and are trying to clarify what she needs as far as assistance with ADLS.  The patient said she works. They [employer] ) has tried to reach her multiple times since the initial calls to verify her needs and set up an in home assessment. The patient has not returned their calls.  Sheralyn Boatman would like to know if the patient really needs help with ADLs and not just housekeeping.  She was very clear that they do not help with housekeeping chores.  She said they will reach out to the patient one more time and will close the referral after that if they are not able to reach her.

## 2023-03-07 ENCOUNTER — Other Ambulatory Visit: Payer: Self-pay

## 2023-03-07 ENCOUNTER — Ambulatory Visit (INDEPENDENT_AMBULATORY_CARE_PROVIDER_SITE_OTHER): Payer: Self-pay

## 2023-03-07 ENCOUNTER — Emergency Department (HOSPITAL_COMMUNITY)
Admission: EM | Admit: 2023-03-07 | Discharge: 2023-03-07 | Disposition: A | Discharge status: Home / Self Care | Payer: Medicaid Other | Attending: Emergency Medicine | Admitting: Emergency Medicine

## 2023-03-07 DIAGNOSIS — I10 Essential (primary) hypertension: Secondary | ICD-10-CM | POA: Insufficient documentation

## 2023-03-07 DIAGNOSIS — Z7982 Long term (current) use of aspirin: Secondary | ICD-10-CM | POA: Insufficient documentation

## 2023-03-07 DIAGNOSIS — J45909 Unspecified asthma, uncomplicated: Secondary | ICD-10-CM | POA: Diagnosis not present

## 2023-03-07 DIAGNOSIS — Z7951 Long term (current) use of inhaled steroids: Secondary | ICD-10-CM | POA: Diagnosis not present

## 2023-03-07 DIAGNOSIS — Z79899 Other long term (current) drug therapy: Secondary | ICD-10-CM | POA: Insufficient documentation

## 2023-03-07 DIAGNOSIS — R109 Unspecified abdominal pain: Secondary | ICD-10-CM | POA: Diagnosis present

## 2023-03-07 DIAGNOSIS — N611 Abscess of the breast and nipple: Secondary | ICD-10-CM | POA: Insufficient documentation

## 2023-03-07 LAB — CBC
HCT: 36.7 % (ref 36.0–46.0)
Hemoglobin: 11.7 g/dL — ABNORMAL LOW (ref 12.0–15.0)
MCH: 29.8 pg (ref 26.0–34.0)
MCHC: 31.9 g/dL (ref 30.0–36.0)
MCV: 93.4 fL (ref 80.0–100.0)
Platelets: 123 10*3/uL — ABNORMAL LOW (ref 150–400)
RBC: 3.93 MIL/uL (ref 3.87–5.11)
RDW: 14.1 % (ref 11.5–15.5)
WBC: 3.3 10*3/uL — ABNORMAL LOW (ref 4.0–10.5)
nRBC: 0 % (ref 0.0–0.2)

## 2023-03-07 LAB — URINALYSIS, ROUTINE W REFLEX MICROSCOPIC
Bilirubin Urine: NEGATIVE
Glucose, UA: NEGATIVE mg/dL
Hgb urine dipstick: NEGATIVE
Ketones, ur: NEGATIVE mg/dL
Leukocytes,Ua: NEGATIVE
Nitrite: NEGATIVE
Protein, ur: NEGATIVE mg/dL
Specific Gravity, Urine: 1.015 (ref 1.005–1.030)
pH: 8 (ref 5.0–8.0)

## 2023-03-07 LAB — COMPREHENSIVE METABOLIC PANEL
ALT: 45 U/L — ABNORMAL HIGH (ref 0–44)
AST: 50 U/L — ABNORMAL HIGH (ref 15–41)
Albumin: 3.4 g/dL — ABNORMAL LOW (ref 3.5–5.0)
Alkaline Phosphatase: 96 U/L (ref 38–126)
Anion gap: 8 (ref 5–15)
BUN: 10 mg/dL (ref 6–20)
CO2: 24 mmol/L (ref 22–32)
Calcium: 8.6 mg/dL — ABNORMAL LOW (ref 8.9–10.3)
Chloride: 105 mmol/L (ref 98–111)
Creatinine, Ser: 0.61 mg/dL (ref 0.44–1.00)
GFR, Estimated: >60 mL/min (ref >60)
Glucose, Bld: 94 mg/dL (ref 70–99)
Potassium: 3.6 mmol/L (ref 3.5–5.1)
Sodium: 137 mmol/L (ref 135–145)
Total Bilirubin: 0.7 mg/dL (ref 0.3–1.2)
Total Protein: 6.1 g/dL — ABNORMAL LOW (ref 6.5–8.1)

## 2023-03-07 LAB — LIPASE, BLOOD: Lipase: 32 U/L (ref 11–51)

## 2023-03-07 MED ORDER — MAALOX MAX 400-400-40 MG/5ML PO SUSP: 15.0000 mL | Freq: Four times a day (QID) | ORAL | 0 refills | Status: DC | PRN

## 2023-03-07 MED ORDER — DOXYCYCLINE HYCLATE 100 MG PO CAPS: 100.0000 mg | ORAL_CAPSULE | Freq: Two times a day (BID) | ORAL | 0 refills | Status: AC

## 2023-03-07 MED ORDER — SUCRALFATE 1 GM/10ML PO SUSP: 1.0000 g | Freq: Three times a day (TID) | ORAL | 1 refills | Status: DC

## 2023-03-07 MED ORDER — OXYCODONE HCL 5 MG PO TABS
15.0000 mg | ORAL_TABLET | Freq: Once | ORAL | Status: AC
  Administered 2023-03-07: 15 mg via ORAL
  Filled 2023-03-07: qty 3

## 2023-03-07 NOTE — Telephone Encounter (Signed)
  Chief Complaint: severe stomach pain Symptoms: stomach pain statd ran out of Carafate and wants refill -  Frequency: 4 days  Pertinent Negatives: Patient denies vomiting Disposition: [] ED /[] Urgent Care (no appt availability in office) / [] Appointment(In office/virtual)/ []  [facility]/ [] Home Care/ [x] Refused Recommended Disposition /[] [facility]/ []  Follow-up with PCP Additional Notes: advised ED Unsure if pt will go. Pt kept focus on wanting the refill fo Carafate and Kenalog cream. Advised pt that it could something else causing her pain and it could be several hours before she gets the meds if PCP decides to order them.  Reason for Disposition  [1] SEVERE pain (e.g., excruciating) AND [2] present > 1 hour  Answer Assessment - Initial Assessment Questions 1. LOCATION: "Where does it hurt?"      Down legs  stomach pain  2. RADIATION: "Does the pain shoot anywhere else?" (e.g., chest, back)     Back pain too  3. ONSET: "When did the pain begin?" (e.g., minutes, hours or days ago)      12/19/22 fall    ran out of medicine 3-4 5. PATTERN "Does the pain come and go, or is it constant?"    - If it comes and goes: "How long does it last?" "Do you have pain now?"     (Note: Comes and goes means the pain is intermittent. It goes away completely between bouts.)    - If constant: "Is it getting better, staying the same, or getting worse?"      (Note: Constant means the pain never goes away completely; most serious pain is constant and gets worse.)      Goes away with pain pill  6. SEVERITY: "How bad is the pain?"  (e.g., Scale 1-10; mild, moderate, or severe)    - MILD (1-3): Doesn't interfere with normal activities, abdomen soft and not tender to touch.     - MODERATE (4-7): Interferes with normal activities or awakens from sleep, abdomen tender to touch.     - SEVERE (8-10): Excruciating pain, doubled over, unable to do any normal activities.       10 7. RECURRENT  SYMPTOM: "Have you ever had this type of stomach pain before?" If Yes, ask: "When was the last time?" and "What happened that time?"      yes 8. CAUSE: "What do you think is causing the stomach pain?"     *No Answer* 9. RELIEVING/AGGRAVATING FACTORS: "What makes it better or worse?" (e.g., antacids, bending or twisting motion, bowel movement)     *No Answer* 10. OTHER SYMPTOMS: "Do you have any other symptoms?" (e.g., back pain, diarrhea, fever, urination pain, vomiting)       *No Answer* 11. PREGNANCY: "Is there any chance you are pregnant?" "When was your last menstrual period?"       N/a  Protocols used: Abdominal Pain - [facility]

## 2023-03-07 NOTE — Telephone Encounter (Signed)
Could you contact pt and schedule an appt

## 2023-03-07 NOTE — Telephone Encounter (Signed)
Will forward to provider  

## 2023-03-07 NOTE — ED Triage Notes (Signed)
Pt. Stated, Lavenia Atlas had stomach pain and back pain for a week after I was seen here and given med and I ran out. I also have a bump on my rt. Breast for 3 days. I have to be out of here at 200pm cause I have a GI Dr. Alfonzo Beers .

## 2023-03-07 NOTE — Discharge Instructions (Signed)
We evaluated you for your stomach pain.  Your laboratory testing was reassuring.  Your stomach pain is probably related to inflammation in your stomach.  Please follow-up with your GI doctor today as you are scheduled to follow-up with them.  Please be sure you are taking your Nexium as prescribed.  I have prescribed you Carafate and Maalox.  We also noticed that you had a small abscess below your right breast.  We were able to get the pus out in the emergency department.  I prescribed you an antibiotic to take for this.  Please take this twice daily for 7 days.  Please be sure to follow-up with your primary doctor to make sure that this is healing.  Please return if you have any new or worsening symptoms such as severe pain, vomiting, lightheadedness or dizziness, fainting, chest pain, fevers or chills, or any other new symptoms.

## 2023-03-08 ENCOUNTER — Telehealth (INDEPENDENT_AMBULATORY_CARE_PROVIDER_SITE_OTHER): Payer: Self-pay | Admitting: Primary Care

## 2023-03-08 ENCOUNTER — Telehealth: Payer: Self-pay

## 2023-03-08 ENCOUNTER — Other Ambulatory Visit (INDEPENDENT_AMBULATORY_CARE_PROVIDER_SITE_OTHER): Payer: Self-pay | Admitting: Primary Care

## 2023-03-08 DIAGNOSIS — R21 Rash and other nonspecific skin eruption: Secondary | ICD-10-CM

## 2023-03-08 MED ORDER — TRIAMCINOLONE ACETONIDE 0.1 % EX CREA
1.0000 | TOPICAL_CREAM | Freq: Three times a day (TID) | CUTANEOUS | 1 refills | Status: DC
Start: 2023-03-08 — End: 2023-03-15

## 2023-03-08 NOTE — Telephone Encounter (Signed)
Will forward to provider  

## 2023-03-08 NOTE — Telephone Encounter (Signed)
error 

## 2023-03-08 NOTE — ED Provider Notes (Signed)
EMERGENCY DEPARTMENT AT [facility] Provider Note  CSN: [id number] Arrival date & time: 03/07/23 1011  Chief Complaint(s) Abdominal Pain, Back Pain, and Abscess  HPI Heather Perry is a 57 y.o. female history of GERD, hypertension, obesity, presenting to the emergency department with request for medication refill.  She requested medication refill for Carafate.  She reports that she has stomach pain that happens whenever she drinks soda or eats pizza, other fatty foods.  This makes her have a burning sensation.  She reports she takes Carafate and it makes her symptoms go away.  She has an appointment with her GI doctor in a couple of hours.  No nausea, vomiting, chest pain or shortness of breath, diarrhea, black stools, or other symptoms.  She reports she tried to call her primary doctor but they would not refill it for her over the phone.  Reports this is a chronic problem that has been going on for some time.  Patient also reports a bump under her right breast.  Reports it is mildly painful.  No fevers or chills.  No drainage.   Past Medical History Past Medical History:  Diagnosis Date   Allergy    Anxiety    Arthritis    Asthma    Blood transfusion without reported diagnosis    Depression    GERD (gastroesophageal reflux disease)    High cholesterol    Hypertension    Obesity    Osteoporosis    Recurrent boils    Seizures (HCC)    last seizure "3 years ago" per pt 06-16-16 KBW   Sleep apnea    no CPAP used   Uterine fibroid    Patient Active Problem List   Diagnosis Date Noted   Cocaine abuse, uncomplicated (HCC) 01/30/2023   Sjogren syndrome, unspecified (HCC) 01/30/2023   Lumbar degenerative disc disease 07/28/2020   Anxiety disorder 06/28/2017   MDD (major depressive disorder), recurrent episode, mild (HCC) 06/28/2017   Essential hypertension 06/26/2017   Hyperlipidemia with target LDL less than 100 06/26/2017   Chest pain with low risk for  cardiac etiology 06/26/2017   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 Authorizing Provider  alum & mag hydroxide-simeth (MAALOX MAX) 400-400-40 MG/5ML suspension Take 15 mLs by mouth every 6 (six) hours as needed for indigestion. 03/07/23  Yes Lonell Grandchild, MD  doxycycline (VIBRAMYCIN) 100 MG capsule Take 1 capsule (100 mg total) by mouth 2 (two) times daily for 7 days. 03/07/23 03/14/23 Yes Lonell Grandchild, MD  AMITIZA 24 MCG capsule TAKE 1 CAPSULE (24 MCG TOTAL) BY MOUTH 2 (TWO) TIMES DAILY WITH A MEAL. 03/11/21   Raulkar, Drema Pry, MD  amLODipine (NORVASC) 5 MG tablet Take 1 tablet (5 mg total) by mouth daily. 01/30/23   Grayce Sessions, NP  aspirin EC 81 MG tablet Take 81 mg by mouth daily.    [provider]  atorvastatin (LIPITOR) 20 MG tablet TAKE ONE TABLET BY MOUTH ONCE DAILY FOR CHOLESTEROL 08/29/22   Grayce Sessions, NP  benzonatate (TESSALON) 100 MG capsule Take 1 capsule (100 mg total) by mouth 2 (two) times daily as needed for cough. Patient not taking: Reported on 01/30/2023 10/12/22   Grayce Sessions, NP  budesonide-formoterol [facility]) 160-4.5 MCG/ACT inhaler Inhale 2 puffs into the lungs 2 (two) times  daily. Patient taking differently: Inhale 2 puffs into the lungs 2 (two) times daily as needed (wheezing, sob). 05/18/16   Henrietta Hoover, NP  celecoxib (CELEBREX) 100 MG capsule Take 1 capsule (100 mg total) by mouth 2 (two) times daily. 08/29/22   Grayce Sessions, NP  cetirizine (ZYRTEC) 10 MG tablet TAKE ONE TABLET BY MOUTH ONCE DAILY FOR ALLERGIES 12/21/22   Grayce Sessions, NP  clonazePAM (KLONOPIN) 1 MG tablet Take 0.5 mg by mouth 2 (two) times daily as needed for anxiety. 12/15/21   [provider]  diclofenac Sodium (VOLTAREN) 1 % GEL Apply 2 g  topically 4 (four) times daily. 02/02/23   Elayne Snare K, DO  dicyclomine (BENTYL) 20 MG tablet Take 1 tablet (20 mg total) by mouth 2 (two) times daily. 07/31/22   Alphia Kava, MD  esomeprazole (NEXIUM) 40 MG capsule Take one capsule as needed for indigestion 08/29/22   Grayce Sessions, NP  fluticasone (FLONASE) 50 MCG/ACT nasal spray PLACE 2 SPRAYS INTO BOTH NOSTRILS DAILY. 01/20/23   Grayce Sessions, NP  gabapentin (NEURONTIN) 300 MG capsule Take 300 mg by mouth 3 (three) times daily. 12/24/21   [provider]  hydrochlorothiazide (HYDRODIURIL) 25 MG tablet Take 1 tablet (25 mg total) by mouth daily. 01/30/23   Grayce Sessions, NP  hydroxychloroquine (PLAQUENIL) 200 MG tablet Take 400 mg by mouth daily. 03/21/17   [provider]  hydroxypropyl methylcellulose / hypromellose (ISOPTO TEARS / GONIOVISC) 2.5 % ophthalmic solution Place 1 drop into both eyes 3 (three) times daily as needed for dry eyes. 02/11/22   Grayce Sessions, NP  ipratropium-albuterol (DUONEB) 0.5-2.5 (3) MG/3ML SOLN Take 3 mLs by nebulization every 6 (six) hours as needed. Patient taking differently: Take 3 mLs by nebulization every 6 (six) hours as needed (for SOB). 05/18/16   Henrietta Hoover, NP  leflunomide (ARAVA) 20 MG tablet Take 20 mg by mouth daily. 11/11/19   [provider]  lidocaine (LIDODERM) 5 % Place 1 patch onto the skin daily. Remove & Discard patch within 12 hours or as directed by MD 04/05/22   Alvira Monday, MD  lidocaine (XYLOCAINE) 5 % ointment Apply 1 Application topically as needed. 02/02/23   Rexford Maus, DO  methocarbamol (ROBAXIN) 500 MG tablet Take 1 tablet (500 mg total) by mouth every 8 (eight) hours as needed for muscle spasms. 01/30/23   Grayce Sessions, NP  methylPREDNISolone (MEDROL DOSEPAK) 4 MG TBPK tablet Take as directed per package instructions Patient not taking: Reported on 01/30/2023 12/31/22   Darrick Grinder, PA-C  Misc.  Devices (WALKER) MISC 1 Device by Does not apply route daily as needed. 07/03/21   Rhys Martini, PA-C  nitrofurantoin, macrocrystal-monohydrate, (MACROBID) 100 MG capsule Take 1 capsule (100 mg total) by mouth 2 (two) times daily. 02/02/23   Elayne Snare K, DO  oxyCODONE (ROXICODONE) 15 MG immediate release tablet Take 15 mg by mouth 4 (four) times daily as needed for pain. 12/29/21   [provider]  permethrin (ELIMITE) 5 % cream Apply to affected area once 06/18/22   Fayrene Helper, PA-C  phenytoin (DILANTIN) 100 MG ER capsule Take 100 mg by mouth 3 (three) times daily.    [provider]  predniSONE (DELTASONE) 10 MG tablet Take 1 tablet (10 mg total) by mouth daily with breakfast. 01/09/23   Anders Simmonds, PA-C  sucralfate (CARAFATE) 1 GM/10ML suspension Take 10 mLs (1 g total) by mouth with  breakfast, with lunch, and with evening meal. 03/07/23   Lonell Grandchild, MD  traZODone (DESYREL) 50 MG tablet Take 0.5-1 tablets (25-50 mg total) by mouth at bedtime as needed. for sleep 01/30/23   Grayce Sessions, NP  triamcinolone 0.1% oint-Eucerin equivalent cream 1:1 mixture Apply topically 3 (three) times daily. 05/18/22   Grayce Sessions, NP  triamcinolone cream (KENALOG) 0.1 % Apply 1 Application topically 3 (three) times daily. 05/18/22   Grayce Sessions, NP  VENTOLIN HFA 108 (90 Base) MCG/ACT inhaler INHALE 2 PUFFS BY MOUTH EVERY 6 HOURS AS NEEDED FOR SHORTNESS OF BREATH OR WHEEZING. 12/13/22   Grayce Sessions, NP                                                                                                                                    Past Surgical History Past Surgical History:  Procedure Laterality Date   ABDOMINAL HYSTERECTOMY     BREAST BIOPSY Right 06/28/2022   [place] RT BREAST BX W LOC DEV 1ST LESION IMG BX SPEC US GUIDE 06/28/2022 [facility] M[facility]   CESAREAN SECTION     KNEE SURGERY Right    x2   UPPER GASTROINTESTINAL ENDOSCOPY     Family  History Family History  Problem Relation Age of Onset   Breast cancer Maternal Aunt        unsure how old of onset   Breast cancer Maternal Aunt        unsure how old of onset   Colon cancer Neg Hx    Esophageal cancer Neg Hx    Rectal cancer Neg Hx    Stomach cancer Neg Hx     Social History Social History   Tobacco Use   Smoking status: Never   Smokeless tobacco: Never  Vaping Use   Vaping status: Never Used  Substance Use Topics   Alcohol use: Yes    Alcohol/week: 2.0 standard drinks of alcohol    Types: 2 Cans of beer per week    Comment: occasionally   Drug use: No    Types: "Crack" cocaine    Comment: last smoked 2011   Allergies Hydrocodone-acetaminophen, Lortab [hydrocodone-acetaminophen], Niacin, Penicillin g, and Penicillins  Review of Systems Review of Systems  All other systems reviewed and are negative.   Physical Exam Vital Signs  I have reviewed the triage vital signs BP (!) 147/80 (BP Location: Right Arm)   Pulse 84   Temp 98.4 F (36.9 C) (Oral)   Resp 18   Ht 5\' 2"  (1.575 m)   Wt 92.5 kg   SpO2 98%   BMI 37.31 kg/m  Physical Exam Vitals and nursing note reviewed.  Constitutional:      General: She is not in acute distress.    Appearance: She is well-developed.  HENT:     Head: Normocephalic and atraumatic.     Mouth/Throat:     Mouth: Mucous  membranes are moist.  Eyes:     Pupils: Pupils are equal, round, and reactive to light.  Cardiovascular:     Rate and Rhythm: Normal rate and regular rhythm.     Heart sounds: No murmur heard. Pulmonary:     Effort: Pulmonary effort is normal. No respiratory distress.     Breath sounds: Normal breath sounds.  Chest:     Comments: Chaperoned by RN, very small approximately 1 x 1 cm and very superficial abscess underneath the right breast.  Was able to express small amount of purulence when squeezed.  No surrounding erythema or warmth. Abdominal:     General: Abdomen is flat.     Palpations:  Abdomen is soft.     Tenderness: There is no abdominal tenderness.  Musculoskeletal:        General: No tenderness.     Right lower leg: No edema.     Left lower leg: No edema.  Skin:    General: Skin is warm and dry.  Neurological:     General: No focal deficit present.     Mental Status: She is alert. Mental status is at baseline.  Psychiatric:        Mood and Affect: Mood normal.        Behavior: Behavior normal.     ED Results and Treatments Labs (all labs ordered are listed, but only abnormal results are displayed) Labs Reviewed  COMPREHENSIVE METABOLIC PANEL - Abnormal; Notable for the following components:      Result Value   Calcium 8.6 (*)    Total Protein 6.1 (*)    Albumin 3.4 (*)    AST 50 (*)    ALT 45 (*)    All other components within normal limits  CBC - Abnormal; Notable for the following components:   WBC 3.3 (*)    Hemoglobin 11.7 (*)    Platelets 123 (*)    All other components within normal limits  URINALYSIS, ROUTINE W REFLEX MICROSCOPIC - Abnormal; Notable for the following components:   APPearance HAZY (*)    All other components within normal limits  LIPASE, BLOOD                                                                                                                          Radiology No results found.  Pertinent labs & imaging results that were available during my care of the patient were reviewed by me and considered in my medical decision making (see MDM for details).  Medications Ordered in ED Medications  oxyCODONE (Oxy IR/ROXICODONE) immediate release tablet 15 mg (15 mg Oral Given 03/07/23 1408)  Procedures Procedures  (including critical care time)  Medical Decision Making / ED Course   MDM:  57 year old presenting to the emergency department requesting refill.  Will refill patient's  Carafate.  Her symptoms sound like her typical gastritis.  She has follow-up today with gastroenterology.  She reports she has been taking her antacid medication.  Discussed dietary modification.  She is not having any other concerning symptoms such as melena, nausea or vomiting, fevers or chills.  Abdomen is soft and nontender, doubt other acute process such as pancreatitis, cholecystitis, perforation, obstruction or any other acute issue.  Patient also complains of small bump under her right breast.  Appears patient has a small abscess.  Was able to express purulence and it is already spontaneously draining.  It is very small.  Will trial doxycycline.  Advised  follow-up with primary doctor.  Will discharge patient to home. All questions answered. Patient comfortable with plan of discharge. Return precautions discussed with patient and specified on the after visit summary.       Additional history obtained:  -External records from outside source obtained and reviewed including: Chart review including previous notes, labs, imaging, consultation notes including  PMD notes   Lab Tests: -I ordered, reviewed, and interpreted labs.   The pertinent results include:   Labs Reviewed  COMPREHENSIVE METABOLIC PANEL - Abnormal; Notable for the following components:      Result Value   Calcium 8.6 (*)    Total Protein 6.1 (*)    Albumin 3.4 (*)    AST 50 (*)    ALT 45 (*)    All other components within normal limits  CBC - Abnormal; Notable for the following components:   WBC 3.3 (*)    Hemoglobin 11.7 (*)    Platelets 123 (*)    All other components within normal limits  URINALYSIS, ROUTINE W REFLEX MICROSCOPIC - Abnormal; Notable for the following components:   APPearance HAZY (*)    All other components within normal limits  LIPASE, BLOOD    Medicines ordered and prescription drug management: Meds ordered this encounter  Medications   sucralfate (CARAFATE) 1 GM/10ML suspension     Sig: Take 10 mLs (1 g total) by mouth with breakfast, with lunch, and with evening meal.    Dispense:  420 mL    Refill:  1   alum & mag hydroxide-simeth (MAALOX MAX) 400-400-40 MG/5ML suspension    Sig: Take 15 mLs by mouth every 6 (six) hours as needed for indigestion.    Dispense:  355 mL    Refill:  0   doxycycline (VIBRAMYCIN) 100 MG capsule    Sig: Take 1 capsule (100 mg total) by mouth 2 (two) times daily for 7 days.    Dispense:  14 capsule    Refill:  0   oxyCODONE (Oxy IR/ROXICODONE) immediate release tablet 15 mg    -I have reviewed the patients home medicines and have made adjustments as needed  Reevaluation: After the interventions noted above, I reevaluated the patient and found that their symptoms have improved  Co morbidities that complicate the patient evaluation  Past Medical History:  Diagnosis Date   Allergy    Anxiety    Arthritis    Asthma    Blood transfusion without reported diagnosis    Depression    GERD (gastroesophageal reflux disease)    High cholesterol    Hypertension    Obesity    Osteoporosis    Recurrent boils  Seizures (HCC)    last seizure "3 years ago" per pt 06-16-16 KBW   Sleep apnea    no CPAP used   Uterine fibroid       Dispostion: Disposition decision including need for hospitalization was considered, and patient discharged from emergency department.    Final Clinical Impression(s) / ED Diagnoses Final diagnoses:  Stomach ache  Abscess of right breast     This chart was dictated using voice recognition software.  Despite best efforts to proofread,  errors can occur which can change the documentation meaning.    Lonell Grandchild, MD 03/08/23 [phone]

## 2023-03-08 NOTE — Telephone Encounter (Signed)
Pt is requesting Kenalog cream for her breast.

## 2023-03-08 NOTE — Telephone Encounter (Signed)
Call received from Lori/ [facility] who explained that they were able to reach the patient about PCS but she declined the services.  She said that she doesn't want services if her daughter can't be her aide. [facility]cal said that they do not pay family members to be aides for other family members. The patient  said if her daughter can't be the aide she doesn't want it.

## 2023-03-08 NOTE — Telephone Encounter (Signed)
Pt is scheduled for 09/25

## 2023-03-13 ENCOUNTER — Other Ambulatory Visit (HOSPITAL_COMMUNITY): Payer: Self-pay | Admitting: Nurse Practitioner

## 2023-03-13 DIAGNOSIS — M48061 Spinal stenosis, lumbar region without neurogenic claudication: Secondary | ICD-10-CM

## 2023-03-13 DIAGNOSIS — M4316 Spondylolisthesis, lumbar region: Secondary | ICD-10-CM

## 2023-03-14 ENCOUNTER — Telehealth (INDEPENDENT_AMBULATORY_CARE_PROVIDER_SITE_OTHER): Payer: Self-pay | Admitting: Primary Care

## 2023-03-14 ENCOUNTER — Ambulatory Visit (HOSPITAL_COMMUNITY)
Admission: RE | Admit: 2023-03-14 | Discharge: 2023-03-14 | Disposition: A | Discharge status: Home / Self Care | Payer: Medicaid Other | Source: Ambulatory Visit | Attending: Nurse Practitioner | Admitting: Nurse Practitioner

## 2023-03-14 DIAGNOSIS — M48061 Spinal stenosis, lumbar region without neurogenic claudication: Secondary | ICD-10-CM | POA: Diagnosis present

## 2023-03-14 DIAGNOSIS — M4316 Spondylolisthesis, lumbar region: Secondary | ICD-10-CM | POA: Diagnosis present

## 2023-03-15 ENCOUNTER — Ambulatory Visit (INDEPENDENT_AMBULATORY_CARE_PROVIDER_SITE_OTHER): Payer: Medicaid Other | Admitting: Primary Care

## 2023-03-15 ENCOUNTER — Encounter (INDEPENDENT_AMBULATORY_CARE_PROVIDER_SITE_OTHER): Payer: Self-pay | Admitting: Primary Care

## 2023-03-15 VITALS — BP 124/83 | HR 98 | Resp 16 | Wt 205.4 lb

## 2023-03-15 DIAGNOSIS — Z23 Encounter for immunization: Secondary | ICD-10-CM

## 2023-03-15 DIAGNOSIS — Z09 Encounter for follow-up examination after completed treatment for conditions other than malignant neoplasm: Secondary | ICD-10-CM

## 2023-03-15 DIAGNOSIS — M5440 Lumbago with sciatica, unspecified side: Secondary | ICD-10-CM

## 2023-03-15 DIAGNOSIS — R232 Flushing: Secondary | ICD-10-CM

## 2023-03-15 DIAGNOSIS — R21 Rash and other nonspecific skin eruption: Secondary | ICD-10-CM | POA: Diagnosis not present

## 2023-03-15 DIAGNOSIS — G8929 Other chronic pain: Secondary | ICD-10-CM

## 2023-03-15 MED ORDER — PERMETHRIN 5 % EX CREA
TOPICAL_CREAM | CUTANEOUS | 0 refills | Status: DC
Start: 2023-03-15 — End: 2023-06-02

## 2023-03-15 MED ORDER — TRIAMCINOLONE ACETONIDE 0.1 % EX CREA
1.0000 | TOPICAL_CREAM | Freq: Three times a day (TID) | CUTANEOUS | 1 refills | Status: DC
Start: 2023-03-15 — End: 2023-07-10

## 2023-03-15 MED ORDER — CELECOXIB 100 MG PO CAPS
100.0000 mg | ORAL_CAPSULE | Freq: Two times a day (BID) | ORAL | 1 refills | Status: DC
Start: 2023-03-15 — End: 2023-09-03

## 2023-03-15 NOTE — Progress Notes (Signed)
[facility]   Subjective:   Heather Perry is a 57 y.o. female presents for EDl follow up.   Patient presented on  03/07/23, with complaints of  Abdominal Pain , Back Pain Abscess.  Patient called requesting medication refill for Carafate. She reports that she has stomach pain that happens whenever she drinks soda or eats pizza, other fatty foods.  The medication Carafate makes her have a burning sensation.  She was discharged with diagnosis of Stomach ache and abscess of right breast.  Treated with antibiotics and refilled her care..  Today she is doing much better however she has chronic back pain and followed by [facility] spine specialist.  She also was Was Still Having Hot Flashes. Past Medical History:  Diagnosis Date   Allergy    Anxiety    Arthritis    Asthma    Blood transfusion without reported diagnosis    Depression    GERD (gastroesophageal reflux disease)    High cholesterol    Hypertension    Obesity    Osteoporosis    Recurrent boils    Seizures (HCC)    last seizure "3 years ago" per pt 06-16-16 KBW   Sleep apnea    no CPAP used   Uterine fibroid      Allergies  Allergen Reactions   Hydrocodone-Acetaminophen Itching    Started itching.  Pt states that she can take this medication Other reaction(s): Other (See Comments) Pruritus   Lortab [Hydrocodone-Acetaminophen]     Started itching   Niacin Hives   Penicillin G Hives and Nausea And Vomiting    Did it involve swelling of the face/tongue/throat, SOB, or low BP? No Did it involve sudden or severe rash/hives, skin peeling, or any reaction on the inside of your mouth or nose? Yes Did you need to seek medical attention at a hospital or doctor's office? No When did it last happen?    Over 20 years Ago   If all above answers are "NO", may proceed with cephalosporin use.     Penicillins Itching      Current Outpatient Medications on File Prior to Visit  Medication Sig Dispense Refill   alum & mag  hydroxide-simeth (MAALOX MAX) 400-400-40 MG/5ML suspension Take 15 mLs by mouth every 6 (six) hours as needed for indigestion. 355 mL 0   AMITIZA 24 MCG capsule TAKE 1 CAPSULE (24 MCG TOTAL) BY MOUTH 2 (TWO) TIMES DAILY WITH A MEAL. 60 capsule 1   amLODipine (NORVASC) 5 MG tablet Take 1 tablet (5 mg total) by mouth daily. 90 tablet 1   aspirin EC 81 MG tablet Take 81 mg by mouth daily.     atorvastatin (LIPITOR) 20 MG tablet TAKE ONE TABLET BY MOUTH ONCE DAILY FOR CHOLESTEROL 90 tablet 1   budesonide-formoterol (SYMBICORT) 160-4.5 MCG/ACT inhaler Inhale 2 puffs into the lungs 2 (two) times daily. (Patient taking differently: Inhale 2 puffs into the lungs 2 (two) times daily as needed (wheezing, sob).) 1 Inhaler 1   celecoxib (CELEBREX) 100 MG capsule Take 1 capsule (100 mg total) by mouth 2 (two) times daily. 180 capsule 1   cetirizine (ZYRTEC) 10 MG tablet TAKE ONE TABLET BY MOUTH ONCE DAILY FOR ALLERGIES 90 tablet 1   clonazePAM (KLONOPIN) 1 MG tablet Take 0.5 mg by mouth 2 (two) times daily as needed for anxiety.     diclofenac Sodium (VOLTAREN) 1 % GEL Apply 2 g topically 4 (four) times daily. 100 g 0   dicyclomine (BENTYL)  20 MG tablet Take 1 tablet (20 mg total) by mouth 2 (two) times daily. 20 tablet 0   esomeprazole (NEXIUM) 40 MG capsule Take one capsule as needed for indigestion 90 capsule 1   fluticasone (FLONASE) 50 MCG/ACT nasal spray PLACE 2 SPRAYS INTO BOTH NOSTRILS DAILY. 16 g 0   gabapentin (NEURONTIN) 300 MG capsule Take 300 mg by mouth 3 (three) times daily.     hydrochlorothiazide (HYDRODIURIL) 25 MG tablet Take 1 tablet (25 mg total) by mouth daily. 90 tablet 3   hydroxychloroquine (PLAQUENIL) 200 MG tablet Take 400 mg by mouth daily.     hydroxypropyl methylcellulose / hypromellose (ISOPTO TEARS / GONIOVISC) 2.5 % ophthalmic solution Place 1 drop into both eyes 3 (three) times daily as needed for dry eyes. 15 mL 3   ipratropium-albuterol (DUONEB) 0.5-2.5 (3) MG/3ML SOLN Take 3  mLs by nebulization every 6 (six) hours as needed. (Patient taking differently: Take 3 mLs by nebulization every 6 (six) hours as needed (for SOB).) 360 mL 1   leflunomide (ARAVA) 20 MG tablet Take 20 mg by mouth daily.     lidocaine (LIDODERM) 5 % Place 1 patch onto the skin daily. Remove & Discard patch within 12 hours or as directed by MD 30 patch 0   lidocaine (XYLOCAINE) 5 % ointment Apply 1 Application topically as needed. 35.44 g 0   methocarbamol (ROBAXIN) 500 MG tablet Take 1 tablet (500 mg total) by mouth every 8 (eight) hours as needed for muscle spasms. 120 tablet 1   Misc. Devices (WALKER) MISC 1 Device by Does not apply route daily as needed. 1 each 0   nitrofurantoin, macrocrystal-monohydrate, (MACROBID) 100 MG capsule Take 1 capsule (100 mg total) by mouth 2 (two) times daily. 10 capsule 0   oxyCODONE (ROXICODONE) 15 MG immediate release tablet Take 15 mg by mouth 4 (four) times daily as needed for pain.     permethrin (ELIMITE) 5 % cream Apply to affected area once 60 g 0   phenytoin (DILANTIN) 100 MG ER capsule Take 100 mg by mouth 3 (three) times daily.     sucralfate (CARAFATE) 1 GM/10ML suspension Take 10 mLs (1 g total) by mouth with breakfast, with lunch, and with evening meal. 420 mL 1   traZODone (DESYREL) 50 MG tablet Take 0.5-1 tablets (25-50 mg total) by mouth at bedtime as needed. for sleep 90 tablet 0   triamcinolone cream (KENALOG) 0.1 % Apply 1 Application topically 3 (three) times daily. 453.6 g 1   VENTOLIN HFA 108 (90 Base) MCG/ACT inhaler INHALE 2 PUFFS BY MOUTH EVERY 6 HOURS AS NEEDED FOR SHORTNESS OF BREATH OR WHEEZING. 18 g 1   No current facility-administered medications on file prior to visit.     Review of System: Comprehensive ROS Pertinent positive and negative noted in HPI    Objective:  BP 124/83   Pulse 98   Resp 16   Wt 205 lb 6.4 oz (93.2 kg)   SpO2 98%   BMI 37.57 kg/m   Filed Weights   03/15/23 1004  Weight: 205 lb 6.4 oz (93.2 kg)     Physical Exam: General Appearance: Well nourished, in no apparent distress. Eyes: PERRLA, EOMs, conjunctiva no swelling or erythema Sinuses: No Frontal/maxillary tenderness ENT/Mouth: Ext aud canals clear, TMs without erythema, bulging. No erythema, swelling, or exudate on post pharynx.  Tonsils not swollen or erythematous. Hearing normal.  Neck: Supple, thyroid normal.  Respiratory: Respiratory effort normal, BS equal bilaterally without rales,  rhonchi, wheezing or stridor.  Cardio: RRR with no MRGs. Brisk peripheral pulses without edema.  Abdomen: Soft, + BS.  Non tender, no guarding, rebound, hernias, masses. Lymphatics: Non tender without lymphadenopathy.  Musculoskeletal: Full ROM, 5/5 strength, normal gait.  Skin: Warm, dry without rashes, lesions, ecchymosis.  Neuro: Cranial nerves intact. Normal muscle tone, no cerebellar symptoms. Sensation intact.  Psych: Awake and oriented X 3, normal affect, Insight and Judgment appropriate.    Assessment:  Tanyla was seen today for hospitalization follow-up, medication refill and hot flashes.  Diagnoses and all orders for this visit:  Encounter for immunization -     Flu vaccine trivalent PF, 6mos and older(Flulaval,Afluria,Fluarix,Fluzone)  Rash and nonspecific skin eruption -     permethrin (ELIMITE) 5 % cream; Apply to affected area once -     triamcinolone cream (KENALOG) 0.1 %; Apply 1 Application topically 3 (three) times daily.  Chronic bilateral low back pain with sciatica, sciatica laterality unspecified -     celecoxib (CELEBREX) 100 MG capsule; Take 1 capsule (100 mg total) by mouth 2 (two) times daily. Follow-up by spine specialist  Hot flashes Recommended at [place]-healthy diet is FDA approved   Hospital discharge follow-up  Recommended continue to keep appointment with GI.  If you are aware of the foods that cause irritation eliminate them from your diet. Discussed eating small frequent meal, reduction in  acidic foods, fried foods ,spicy foods, alcohol caffeine and tobacco and certain medications. Avoid laying down after eating 44mins-1hour, elevated head of the bed.   This note has been created with Education officer, environmental. Any transcriptional errors are unintentional.   Grayce Sessions, NP 03/15/2023, 10:19 AM

## 2023-03-15 NOTE — Patient Instructions (Addendum)
Influenza Vaccine Injection What is this medication? INFLUENZA VACCINE (in floo EN zuh vak SEEN) reduces the risk of the influenza (flu). It does not treat influenza. It is still possible to get influenza after receiving this vaccine, but the symptoms may be less severe or not last as long. It works by helping your immune system learn how to fight off a future infection. This medicine may be used for other purposes; ask your health care provider or pharmacist if you have questions. COMMON BRAND NAME(S): Afluria Quadrivalent, FLUAD Quadrivalent, Fluarix Quadrivalent, Flublok Quadrivalent, FLUCELVAX Quadrivalent, Flulaval Quadrivalent, Fluzone Quadrivalent What should I tell my care team before I take this medication? They need to know if you have any of these conditions: Bleeding disorder like hemophilia Fever or infection Guillain-Barre syndrome or other neurological problems Immune system problems Infection with the human immunodeficiency virus (HIV) or AIDS Low blood platelet counts Multiple sclerosis An unusual or allergic reaction to influenza virus vaccine, latex, other medications, foods, dyes, or preservatives. Different brands of vaccines contain different allergens. Some may contain latex or eggs. Talk to your care team about your allergies to make sure that you get the right vaccine. Pregnant or trying to get pregnant Breastfeeding How should I use this medication? This vaccine is injected into a muscle or under the skin. It is given by your care team. A copy of Vaccine Information Statements will be given before each vaccination. Be sure to read this sheet carefully each time. This sheet may change often. Talk to your care team to see which vaccines are right for you. Some vaccines should not be used in all age groups. Overdosage: If you think you have taken too much of this medicine contact a poison control center or emergency room at once. NOTE: This medicine is only for you. Do  not share this medicine with others. What if I miss a dose? This does not apply. What may interact with this medication? Certain medications that lower your immune system, such as etanercept, anakinra, infliximab, adalimumab Certain medications that prevent or treat blood clots, such as warfarin Chemotherapy or radiation therapy Phenytoin Steroid medications, such as prednisone or cortisone Theophylline Vaccines This list may not describe all possible interactions. Give your health care provider a list of all the medicines, herbs, non-prescription drugs, or dietary supplements you use. Also tell them if you smoke, drink alcohol, or use illegal drugs. Some items may interact with your medicine. What should I watch for while using this medication? Report any side effects that do not go away with your care team. Call your care team if any unusual symptoms occur within 6 weeks of receiving this vaccine. You may still catch the flu, but the illness is not usually as bad. You cannot get the flu from the vaccine. The vaccine will not protect against colds or other illnesses that may cause fever. The vaccine is needed every year. What side effects may I notice from receiving this medication? Side effects that you should report to your care team as soon as possible: Allergic reactions--skin rash, itching, hives, swelling of the face, lips, tongue, or throat Side effects that usually do not require medical attention (report these to your care team if they continue or are bothersome): Chills Fatigue Headache Joint pain Loss of appetite Muscle pain Nausea Pain, redness, or irritation at injection site This list may not describe all possible side effects. Call your doctor for medical advice about side effects. You may report side effects to FDA at  1-800-FDA-1088. Where should I keep my medication? The vaccine is only given by your care team. It will not be stored at home. NOTE: This sheet is a  summary. It may not cover all possible information. If you have questions about this medicine, talk to your doctor, pharmacist, or health care provider.  2024 Elsevier/Gold Standard (2021-11-16 00:00:00)Managing Hot Flashes During Menopause You will learn what hot flashes/night sweats are, what causes them and what the treatment methods are. To view the content, go to this web address: https://pe.elsevier.com/[email or web address]  This video will expire on: 12/11/2024. If you need access to this video following this date, please reach out to the healthcare provider who assigned it to you. This information is not intended to replace advice given to you by your health care provider. Make sure you discuss any questions you have with your health care provider. Elsevier Patient Education  2024 Elsevier Inc. Lifestyle changes to improve hot flashes Dress in layers that can be removed at the start of a hot flash. Carry a portable fan to use when a hot flash strikes. Avoid alcohol, spicy foods, and caffeine. ... If you smoke, try to quit, not only for hot flashes, but for your overall health. Try to maintain a healthy weight.

## 2023-03-16 ENCOUNTER — Other Ambulatory Visit (INDEPENDENT_AMBULATORY_CARE_PROVIDER_SITE_OTHER): Payer: Self-pay | Admitting: Primary Care

## 2023-03-16 DIAGNOSIS — J45909 Unspecified asthma, uncomplicated: Secondary | ICD-10-CM

## 2023-03-16 NOTE — Telephone Encounter (Signed)
Requested Prescriptions  Pending Prescriptions Disp Refills   VENTOLIN HFA 108 (90 Base) MCG/ACT inhaler [Pharmacy Med Name: VENTOLIN HFA 108 (90 BASE) MCG/ACT INHALATION AEROSOL SOLUTION] 18 g 1    Sig: INHALE 2 PUFFS BY MOUTH EVERY 6 HOURS AS NEEDED FOR SHORTNESS OF BREATH OR WHEEZING.     Pulmonology:  Beta Agonists 2 Passed - 03/16/2023 11:09 AM      Passed - Last BP in normal range    BP Readings from Last 1 Encounters:  03/15/23 124/83         Passed - Last Heart Rate in normal range    Pulse Readings from Last 1 Encounters:  03/15/23 98         Passed - Valid encounter within last 12 months    Recent Outpatient Visits           Yesterday Encounter for immunization   [place] [facility] Grayce Sessions, NP   1 month ago Hospital discharge follow-up   [place] [facility] Grayce Sessions, NP   1 month ago Primary insomnia   [place] [facility] Grayce Sessions, NP   2 months ago Lumbar degenerative disc disease   [place] [facility] Anders Simmonds, PA-C   4 months ago Medication management   [place] [facility] Grayce Sessions, NP       Future Appointments             In 3 months Randa Evens, Kinnie Scales, NP  [facility]

## 2023-04-18 ENCOUNTER — Other Ambulatory Visit (INDEPENDENT_AMBULATORY_CARE_PROVIDER_SITE_OTHER): Payer: Self-pay | Admitting: *Deleted

## 2023-04-18 NOTE — Telephone Encounter (Signed)
Requested medication (s) are due for refill today: Yes  Requested medication (s) are on the active medication list: yes    Last refill: 01/19/22  Amount not specified  Future visit scheduled yes 06/15/23  Notes to clinic:  Pt tearful, states she haas been out of med for 2 weeks. Also asking for refill of citalopram, not on current med profile. Pt states she has had "A lot of those pills."  but has been out a month.   Requested Prescriptions  Pending Prescriptions Disp Refills   phenytoin (DILANTIN) 100 MG ER capsule      Sig: Take 1 capsule (100 mg total) by mouth 3 (three) times daily.     Not Delegated - Neurology:  Anticonvulsants - phenytoin Failed - 04/18/2023  1:40 PM      Failed - This refill cannot be delegated      Failed - ALT in normal range and within 360 days    ALT  Date Value Ref Range Status  03/07/2023 45 (H) 0 - 44 U/L Final         Failed - AST in normal range and within 360 days    AST  Date Value Ref Range Status  03/07/2023 50 (H) 15 - 41 U/L Final         Failed - HGB in normal range and within 360 days    Hemoglobin  Date Value Ref Range Status  03/07/2023 11.7 (L) 12.0 - 15.0 g/dL Final  16/03/9603 54.0 11.1 - 15.9 g/dL Final         Failed - PLT in normal range and within 360 days    Platelets  Date Value Ref Range Status  03/07/2023 123 (L) 150 - 400 K/uL Final  10/28/2022 136 (L) 150 - 450 x10E3/uL Final         Failed - WBC in normal range and within 360 days    WBC  Date Value Ref Range Status  03/07/2023 3.3 (L) 4.0 - 10.5 K/uL Final         Failed - Phenytoin (serum) in normal range and within 360 days    No results found for: "PHENYTOIN", "PHENYTFREE", "PHENYTOINBD", "PHENPERCFREE"       Passed - HCT in normal range and within 360 days    HCT  Date Value Ref Range Status  03/07/2023 36.7 36.0 - 46.0 % Final   Hematocrit  Date Value Ref Range Status  10/28/2022 38.5 34.0 - 46.6 % Final         Passed - Completed PHQ-2 or  PHQ-9 in the last 360 days      Passed - Patient is not pregnant      Passed - Valid encounter within last 12 months    Recent Outpatient Visits           1 month ago Encounter for immunization   [place] [facility] Grayce Sessions, NP   2 months ago Hospital discharge follow-up   [place] [facility] Grayce Sessions, NP   2 months ago Primary insomnia   [place] [facility] Grayce Sessions, NP   3 months ago Lumbar degenerative disc disease   [place] [facility] Anders Simmonds, PA-C   5 months ago Medication management   [place] [facility] Grayce Sessions, NP       Future Appointments             In 1  month Grayce Sessions, NP [place] [facility]

## 2023-04-19 ENCOUNTER — Ambulatory Visit (INDEPENDENT_AMBULATORY_CARE_PROVIDER_SITE_OTHER): Payer: Self-pay

## 2023-04-19 ENCOUNTER — Other Ambulatory Visit (INDEPENDENT_AMBULATORY_CARE_PROVIDER_SITE_OTHER): Payer: Self-pay | Admitting: Primary Care

## 2023-04-19 DIAGNOSIS — K2100 Gastro-esophageal reflux disease with esophagitis, without bleeding: Secondary | ICD-10-CM

## 2023-04-19 DIAGNOSIS — G8929 Other chronic pain: Secondary | ICD-10-CM

## 2023-04-19 NOTE — Telephone Encounter (Signed)
Chief Complaint: Depression  Symptoms: feeling sad, low energy Frequency: comes and goes  Pertinent Negatives: Patient denies thoughts of self harm, or harm to others Disposition: [] ED /[] Urgent Care (no appt availability in office) / [x] Appointment(In office/virtual)/ []  [facility]/ [] Home Care/ [] Refused Recommended Disposition /[] [facility]/ []  Follow-up with PCP Additional Notes: Patient states she woke up in a bad mood yesterday but is feeling better today. Patient states she gets like that when she has been out of her medication for a while. Patient has been scheduled to see PCP next week 04/26/23. Patient is requesting a short term supply of Dilantin and Celecoxib be sent to her pharmacy until her appointment next week. Care advice was given and advised patient to keep her scheduled appointment next week. Patient verbalized understanding and mention she also has hot flashes.  Summary: rx req   The patient would like to speak with a member of clinical staff about a week long refill of their phenytoin (DILANTIN) 100 MG ER capsule for them to continue with the medication until their upcoming appt on 04/26/23  Please contact the patient further when possible     Reason for Disposition  [1] Started on anti-depressant medications < 2 weeks ago AND [2] not feeling any better  Answer Assessment - Initial Assessment Questions 1. CONCERN: "What happened that made you call today?"     I just woke up in a bad mood  2. DEPRESSION SYMPTOM SCREENING: "How are you feeling overall?" (e.g., decreased energy, increased sleeping or difficulty sleeping, difficulty concentrating, feelings of sadness, guilt, hopelessness, or worthlessness)     Low energy, feeling sad  3. RISK OF HARM - SUICIDAL IDEATION:  "Do you ever have thoughts of hurting or killing yourself?"  (e.g., yes, no, no but preoccupation with thoughts about death)   - INTENT:  "Do you have thoughts of hurting or killing  yourself right NOW?" (e.g., yes, no, N/A)   - PLAN: "Do you have a specific plan for how you would do this?" (e.g., gun, knife, overdose, no plan, N/A)     No 4. RISK OF HARM - HOMICIDAL IDEATION:  "Do you ever have thoughts of hurting or killing someone else?"  (e.g., yes, no, no but preoccupation with thoughts about death)   - INTENT:  "Do you have thoughts of hurting or killing someone right NOW?" (e.g., yes, no, N/A)   - PLAN: "Do you have a specific plan for how you would do this?" (e.g., gun, knife, no plan, N/A)      No  5. FUNCTIONAL IMPAIRMENT: "How have things been going for you overall? Have you had more difficulty than usual doing your normal daily activities?"  (e.g., better, same, worse; self-care, school, work, interactions)     I just want to stay in the house  6. SUPPORT: "Who is with you now?" "Who do you live with?" "Do you have family or friends who you can talk to?"      I live alone but I have supportive family and friends  7. THERAPIST: "Do you have a counselor or therapist? Name?"     No right now  8. STRESSORS: "Has there been any new stress or recent changes in your life?"     I fell at [facility] 12/19/22 and been in a lot of pain  9. ALCOHOL USE OR SUBSTANCE USE (DRUG USE): "Do you drink alcohol or use any illegal drugs?"     I drink 2 beers at  night to help me sleep and smoke a joint every now and then  10. OTHER: "Do you have any other physical symptoms right now?" (e.g., fever)       A little pain  Protocols used: Depression-A-AH

## 2023-04-19 NOTE — Telephone Encounter (Signed)
Request too soon last filled 03/15/23 #180, 1RF Requested Prescriptions  Pending Prescriptions Disp Refills   celecoxib (CELEBREX) 100 MG capsule 180 capsule 1    Sig: Take 1 capsule (100 mg total) by mouth 2 (two) times daily.     Analgesics:  COX2 Inhibitors Failed - 04/19/2023 12:06 PM      Failed - Manual Review: Labs are only required if the patient has taken medication for more than 8 weeks.      Failed - HGB in normal range and within 360 days    Hemoglobin  Date Value Ref Range Status  03/07/2023 11.7 (L) 12.0 - 15.0 g/dL Final  25/36/6440 34.7 11.1 - 15.9 g/dL Final         Failed - AST in normal range and within 360 days    AST  Date Value Ref Range Status  03/07/2023 50 (H) 15 - 41 U/L Final         Failed - ALT in normal range and within 360 days    ALT  Date Value Ref Range Status  03/07/2023 45 (H) 0 - 44 U/L Final         Passed - Cr in normal range and within 360 days    Creat  Date Value Ref Range Status  04/25/2016 0.72 0.50 - 1.05 mg/dL Final    Comment:      For patients > or = 59 years of age: The upper reference limit for Creatinine is approximately 13% higher for people identified as African-American.      Creatinine, Ser  Date Value Ref Range Status  03/07/2023 0.61 0.44 - 1.00 mg/dL Final         Passed - HCT in normal range and within 360 days    HCT  Date Value Ref Range Status  03/07/2023 36.7 36.0 - 46.0 % Final   Hematocrit  Date Value Ref Range Status  10/28/2022 38.5 34.0 - 46.6 % Final         Passed - eGFR is 30 or above and within 360 days    GFR, Est African American  Date Value Ref Range Status  04/25/2016 >89 >=60 mL/min Final   GFR calc Af Amer  Date Value Ref Range Status  08/19/2017 >60 >60 mL/min Final    Comment:    (NOTE) The eGFR has been calculated using the CKD EPI equation. This calculation has not been validated in all clinical situations. eGFR's persistently <60 mL/min signify possible Chronic  Kidney Disease.    GFR, Est Non African American  Date Value Ref Range Status  04/25/2016 >89 >=60 mL/min Final   GFR, Estimated  Date Value Ref Range Status  03/07/2023 >60 >60 mL/min Final    Comment:    (NOTE) Calculated using the CKD-EPI Creatinine Equation (2021)    eGFR  Date Value Ref Range Status  10/28/2022 103 >59 mL/min/1.73 Final         Passed - Patient is not pregnant      Passed - Valid encounter within last 12 months    Recent Outpatient Visits           1 month ago Encounter for immunization   [place] [facility] Grayce Sessions, NP   2 months ago Hospital discharge follow-up   [place] [facility] Grayce Sessions, NP   2 months ago Primary insomnia   [place] [facility] Grayce Sessions, NP   3 months ago  Lumbar degenerative disc disease   [place] [facility] [place], Marzella Schlein, PA-C   5 months ago Medication management   [place] [facility] Grayce Sessions, NP       Future Appointments             In 1 week Randa Evens, Kinnie Scales, NP [facility]

## 2023-04-20 NOTE — Telephone Encounter (Signed)
Requested Prescriptions  Pending Prescriptions Disp Refills   atorvastatin (LIPITOR) 20 MG tablet [Pharmacy Med Name: ATORVASTATIN CALCIUM 20 MG ORAL TABLET] 90 tablet 1    Sig: TAKE ONE TABLET BY MOUTH ONCE DAILY FOR CHOLESTEROL     Cardiovascular:  Antilipid - Statins Failed - 04/19/2023 11:02 AM      Failed - Lipid Panel in normal range within the last 12 months    Cholesterol, Total  Date Value Ref Range Status  10/28/2022 213 (H) 100 - 199 mg/dL Final   LDL Chol Calc (NIH)  Date Value Ref Range Status  10/28/2022 90 0 - 99 mg/dL Final   HDL  Date Value Ref Range Status  10/28/2022 97 >39 mg/dL Final   Triglycerides  Date Value Ref Range Status  10/28/2022 158 (H) 0 - 149 mg/dL Final         Passed - Patient is not pregnant      Passed - Valid encounter within last 12 months    Recent Outpatient Visits           1 month ago Encounter for immunization   [place] [facility] Grayce Sessions, NP   2 months ago Hospital discharge follow-up   [place] [facility] Grayce Sessions, NP   2 months ago Primary insomnia   [place] [facility] Grayce Sessions, NP   3 months ago Lumbar degenerative disc disease   [place] [facility] Anders Simmonds, PA-C   5 months ago Medication management   [place] [facility] Grayce Sessions, NP       Future Appointments             In 6 days Grayce Sessions, NP [place] [facility]             esomeprazole (NEXIUM) 40 MG capsule [Pharmacy Med Name: ESOMEPRAZOLE MAGNESIUM 40 MG ORAL CAPSULE DELAYED RELEASE] 90 capsule 1    Sig: TAKE ONE CAPSULE AS NEEDED FOR INDIGESTION     Gastroenterology: Proton Pump Inhibitors 2 Failed - 04/19/2023 11:02 AM      Failed - ALT in normal range and within 360 days    ALT  Date Value Ref Range Status  03/07/2023 45 (H) 0 - 44 U/L Final         Failed - AST  in normal range and within 360 days    AST  Date Value Ref Range Status  03/07/2023 50 (H) 15 - 41 U/L Final         Passed - Valid encounter within last 12 months    Recent Outpatient Visits           1 month ago Encounter for immunization   [place] [facility] Grayce Sessions, NP   2 months ago Hospital discharge follow-up   [place] [facility] Grayce Sessions, NP   2 months ago Primary insomnia   [place] [facility] Grayce Sessions, NP   3 months ago Lumbar degenerative disc disease   [place] [facility] Anders Simmonds, PA-C   5 months ago Medication management   [place] [facility] Grayce Sessions, NP       Future Appointments             In 6 days Randa Evens, Kinnie Scales, NP [place] [facility]

## 2023-04-21 NOTE — Telephone Encounter (Signed)
Called pt to remind them about upcoming apt.

## 2023-04-25 ENCOUNTER — Telehealth (INDEPENDENT_AMBULATORY_CARE_PROVIDER_SITE_OTHER): Payer: Self-pay | Admitting: Primary Care

## 2023-04-25 NOTE — Telephone Encounter (Signed)
Called to inform pt about apt.

## 2023-04-26 ENCOUNTER — Ambulatory Visit (INDEPENDENT_AMBULATORY_CARE_PROVIDER_SITE_OTHER): Payer: Medicaid Other | Admitting: Primary Care

## 2023-04-26 ENCOUNTER — Encounter (INDEPENDENT_AMBULATORY_CARE_PROVIDER_SITE_OTHER): Payer: Self-pay | Admitting: Primary Care

## 2023-04-26 VITALS — BP 130/83 | HR 91 | Resp 16 | Wt 208.8 lb

## 2023-04-26 DIAGNOSIS — F418 Other specified anxiety disorders: Secondary | ICD-10-CM

## 2023-04-26 DIAGNOSIS — I10 Essential (primary) hypertension: Secondary | ICD-10-CM

## 2023-04-26 MED ORDER — DULOXETINE HCL 20 MG PO CPEP
20.0000 mg | ORAL_CAPSULE | Freq: Every day | ORAL | 1 refills | Status: DC
Start: 2023-04-26 — End: 2023-09-25

## 2023-04-26 NOTE — Progress Notes (Signed)
[facility]  Heather Perry, is a 57 y.o. female  QIH:[id number]  OVF:[id number]  DOB - 1965-08-16  Chief Complaint  Patient presents with   Medication Refill       Subjective:   Heather Perry is a 57 y.o. female here today for a follow up visit. Patient has No headache, No chest pain, No abdominal pain - No Nausea, No new weakness tingling or numbness, No Cough - shortness of breath. She is requesting medication previously filled by mental health but did ask to refill medication for depression. She has not taken it for a while start at lowest dose. Patient in agreement . Denies harm to self or others or auditory, visual hallucinations. Patient stated difficulty walking a long distance and needed a handicap placard agreed. As leaving for lunch watch her walk to [facility] with no issues.  No problems updated.  Allergies  Allergen Reactions   Hydrocodone-Acetaminophen Itching    Started itching.  Pt states that she can take this medication Other reaction(s): Other (See Comments) Pruritus   Lortab [Hydrocodone-Acetaminophen]     Started itching   Niacin Hives   Penicillin G Hives and Nausea And Vomiting    Did it involve swelling of the face/tongue/throat, SOB, or low BP? No Did it involve sudden or severe rash/hives, skin peeling, or any reaction on the inside of your mouth or nose? Yes Did you need to seek medical attention at a hospital or doctor's office? No When did it last happen?    Over 20 years Ago   If all above answers are "NO", may proceed with cephalosporin use.     Penicillins Itching    Past Medical History:  Diagnosis Date   Allergy    Anxiety    Arthritis    Asthma    Blood transfusion without reported diagnosis    Depression    GERD (gastroesophageal reflux disease)    High cholesterol    Hypertension    Obesity    Osteoporosis    Recurrent boils    Seizures (HCC)    last seizure "3 years ago" per pt 06-16-16 KBW   Sleep apnea     no CPAP used   Uterine fibroid     Current Outpatient Medications on File Prior to Visit  Medication Sig Dispense Refill   alum & mag hydroxide-simeth (MAALOX MAX) 400-400-40 MG/5ML suspension Take 15 mLs by mouth every 6 (six) hours as needed for indigestion. 355 mL 0   AMITIZA 24 MCG capsule TAKE 1 CAPSULE (24 MCG TOTAL) BY MOUTH 2 (TWO) TIMES DAILY WITH A MEAL. 60 capsule 1   amLODipine (NORVASC) 5 MG tablet Take 1 tablet (5 mg total) by mouth daily. 90 tablet 1   aspirin EC 81 MG tablet Take 81 mg by mouth daily.     atorvastatin (LIPITOR) 20 MG tablet TAKE ONE TABLET BY MOUTH ONCE DAILY FOR CHOLESTEROL 90 tablet 1   budesonide-formoterol (SYMBICORT) 160-4.5 MCG/ACT inhaler Inhale 2 puffs into the lungs 2 (two) times daily. (Patient taking differently: Inhale 2 puffs into the lungs 2 (two) times daily as needed (wheezing, sob).) 1 Inhaler 1   celecoxib (CELEBREX) 100 MG capsule Take 1 capsule (100 mg total) by mouth 2 (two) times daily. 180 capsule 1   cetirizine (ZYRTEC) 10 MG tablet TAKE ONE TABLET BY MOUTH ONCE DAILY FOR ALLERGIES 90 tablet 1   clonazePAM (KLONOPIN) 1 MG tablet Take 0.5 mg by mouth 2 (two) times daily as needed for anxiety.  dicyclomine (BENTYL) 20 MG tablet Take 1 tablet (20 mg total) by mouth 2 (two) times daily. 20 tablet 0   esomeprazole (NEXIUM) 40 MG capsule TAKE ONE CAPSULE AS NEEDED FOR INDIGESTION 90 capsule 1   fluticasone (FLONASE) 50 MCG/ACT nasal spray PLACE 2 SPRAYS INTO BOTH NOSTRILS DAILY. 16 g 0   gabapentin (NEURONTIN) 300 MG capsule Take 300 mg by mouth 3 (three) times daily.     hydrochlorothiazide (HYDRODIURIL) 25 MG tablet Take 1 tablet (25 mg total) by mouth daily. 90 tablet 3   hydroxychloroquine (PLAQUENIL) 200 MG tablet Take 400 mg by mouth daily.     hydroxypropyl methylcellulose / hypromellose (ISOPTO TEARS / GONIOVISC) 2.5 % ophthalmic solution Place 1 drop into both eyes 3 (three) times daily as needed for dry eyes. 15 mL 3    ipratropium-albuterol (DUONEB) 0.5-2.5 (3) MG/3ML SOLN Take 3 mLs by nebulization every 6 (six) hours as needed. (Patient taking differently: Take 3 mLs by nebulization every 6 (six) hours as needed (for SOB).) 360 mL 1   leflunomide (ARAVA) 20 MG tablet Take 20 mg by mouth daily.     lidocaine (LIDODERM) 5 % Place 1 patch onto the skin daily. Remove & Discard patch within 12 hours or as directed by MD 30 patch 0   lidocaine (XYLOCAINE) 5 % ointment Apply 1 Application topically as needed. 35.44 g 0   methocarbamol (ROBAXIN) 500 MG tablet Take 1 tablet (500 mg total) by mouth every 8 (eight) hours as needed for muscle spasms. 120 tablet 1   Misc. Devices (WALKER) MISC 1 Device by Does not apply route daily as needed. 1 each 0   nitrofurantoin, macrocrystal-monohydrate, (MACROBID) 100 MG capsule Take 1 capsule (100 mg total) by mouth 2 (two) times daily. 10 capsule 0   oxyCODONE (ROXICODONE) 15 MG immediate release tablet Take 15 mg by mouth 4 (four) times daily as needed for pain.     permethrin (ELIMITE) 5 % cream Apply to affected area once 60 g 0   phenytoin (DILANTIN) 100 MG ER capsule Take 100 mg by mouth 3 (three) times daily.     sucralfate (CARAFATE) 1 GM/10ML suspension Take 10 mLs (1 g total) by mouth with breakfast, with lunch, and with evening meal. 420 mL 1   triamcinolone cream (KENALOG) 0.1 % Apply 1 Application topically 3 (three) times daily. 453.6 g 1   VENTOLIN HFA 108 (90 Base) MCG/ACT inhaler INHALE 2 PUFFS BY MOUTH EVERY 6 HOURS AS NEEDED FOR SHORTNESS OF BREATH OR WHEEZING. 18 g 1   No current facility-administered medications on file prior to visit.    Objective:   Vitals:   04/26/23 1056  BP: 130/83  Pulse: 91  Resp: 16  SpO2: 98%  Weight: 208 lb 12.8 oz (94.7 kg)    Comprehensive ROS Pertinent positive and negative noted in HPI   Exam General appearance : Awake, alert, not in any distress. Speech Clear. Not toxic looking HEENT: Atraumatic and Normocephalic,  pupils equally reactive to light and accomodation Neck: Supple, no JVD. No cervical lymphadenopathy.  Chest: Good air entry bilaterally, no added sounds  CVS: S1 S2 regular, no murmurs.  Abdomen: Bowel sounds present, Non tender and not distended with no gaurding, rigidity or rebound. Extremities: B/L Lower Ext shows no edema, both legs are warm to touch Neurology: Awake alert, and oriented X 3, Non focal Skin: No Rash  Data Review Lab Results  Component Value Date   HGBA1C 5.1 10/28/2021   HGBA1C  5.3 04/25/2016    Assessment & Plan  Heather "D" was seen today for medication refill.  Diagnoses and all orders for this visit:  Anxiety associated with depression   -     DULoxetine (CYMBALTA) 20 MG capsule; Take 1 capsule (20 mg total) by mouth daily. Refer to psyche stated needed bipolar medication.   Essential hypertension Controlled  BP goal - < 130/80 Explained that having normal blood pressure is the goal and medications are helping to get to goal and maintain normal blood pressure. DIET: Limit salt intake, read nutrition labels to check salt content, limit fried and high fatty foods  Avoid using multisymptom OTC cold preparations that generally contain sudafed which can rise BP. Consult with pharmacist on best cold relief products to use for persons with HTN EXERCISE Discussed incorporating exercise such as walking - 30 minutes most days of the week and can do in 10 minute intervals     Other orders -     DULoxetine (CYMBALTA) 20 MG capsule; Take 1 capsule (20 mg total) by mouth daily.     Patient have been counseled extensively about nutrition and exercise. Other issues discussed during this visit include: low cholesterol diet, weight control and daily exercise, foot care, annual eye examinations at Ophthalmology, importance of adherence with medications and regular follow-up. We also discussed long term complications of uncontrolled diabetes and hypertension.   Return in  about 2 months (around 06/26/2023) for medical conditions.  The patient was given clear instructions to go to ER or return to medical center if symptoms don't improve, worsen or new problems develop. The patient verbalized understanding. The patient was told to call to get lab results if they haven't heard anything in the next week.   This note has been created with Education officer, environmental. Any transcriptional errors are unintentional.   Grayce Sessions, NP 04/26/2023, 1:30 PM

## 2023-05-19 ENCOUNTER — Other Ambulatory Visit (INDEPENDENT_AMBULATORY_CARE_PROVIDER_SITE_OTHER): Payer: Self-pay | Admitting: Primary Care

## 2023-05-19 DIAGNOSIS — J45909 Unspecified asthma, uncomplicated: Secondary | ICD-10-CM

## 2023-05-23 NOTE — Telephone Encounter (Signed)
Requested Prescriptions  Pending Prescriptions Disp Refills   VENTOLIN HFA 108 (90 Base) MCG/ACT inhaler [Pharmacy Med Name: VENTOLIN HFA 108 (90 BASE) MCG/ACT INHALATION AEROSOL SOLUTION] 18 g 1    Sig: INHALE 2 PUFFS BY MOUTH EVERY 6 HOURS AS NEEDED FOR SHORTNESS OF BREATH OR WHEEZING.     Pulmonology:  Beta Agonists 2 Passed - 05/19/2023  9:43 AM      Passed - Last BP in normal range    BP Readings from Last 1 Encounters:  04/26/23 130/83         Passed - Last Heart Rate in normal range    Pulse Readings from Last 1 Encounters:  04/26/23 91         Passed - Valid encounter within last 12 months    Recent Outpatient Visits           3 weeks ago Anxiety associated with depression   [place] [facility] Grayce Sessions, NP   2 months ago Encounter for immunization   [place] [facility] Grayce Sessions, NP   3 months ago Hospital discharge follow-up   [place] [facility] Grayce Sessions, NP   3 months ago Primary insomnia   [place] [facility] Grayce Sessions, NP   4 months ago Lumbar degenerative disc disease    [facility] Anders Simmonds, PA-C

## 2023-05-31 ENCOUNTER — Telehealth (HOSPITAL_COMMUNITY): Payer: Self-pay

## 2023-05-31 NOTE — Telephone Encounter (Signed)
Called PT to see if they wanted to get scheduled based off of referral - PT states I have the wrong Heather Perry - I asked if they would like me to take what they prefer to be called " Heather Perry " PT states no that I have the wrong Heather Perry - referral is now closed

## 2023-06-02 ENCOUNTER — Ambulatory Visit (INDEPENDENT_AMBULATORY_CARE_PROVIDER_SITE_OTHER): Payer: Medicaid Other | Admitting: Primary Care

## 2023-06-02 ENCOUNTER — Ambulatory Visit (INDEPENDENT_AMBULATORY_CARE_PROVIDER_SITE_OTHER): Payer: Self-pay

## 2023-06-02 ENCOUNTER — Encounter (INDEPENDENT_AMBULATORY_CARE_PROVIDER_SITE_OTHER): Payer: Self-pay | Admitting: Primary Care

## 2023-06-02 VITALS — BP 119/84 | HR 83 | Resp 16 | Wt 202.6 lb

## 2023-06-02 DIAGNOSIS — R21 Rash and other nonspecific skin eruption: Secondary | ICD-10-CM | POA: Diagnosis not present

## 2023-06-02 DIAGNOSIS — R1013 Epigastric pain: Secondary | ICD-10-CM

## 2023-06-02 DIAGNOSIS — L732 Hidradenitis suppurativa: Secondary | ICD-10-CM | POA: Diagnosis not present

## 2023-06-02 DIAGNOSIS — K2100 Gastro-esophageal reflux disease with esophagitis, without bleeding: Secondary | ICD-10-CM | POA: Diagnosis not present

## 2023-06-02 MED ORDER — SUCRALFATE 1 GM/10ML PO SUSP: 1.0000 g | Freq: Three times a day (TID) | ORAL | 1 refills | Status: DC

## 2023-06-02 NOTE — Telephone Encounter (Signed)
  Chief Complaint: Abdominal pain Symptoms: pain around navel  Frequency: ongoing Pertinent Negatives: Patient denies  Disposition: [] ED /[] Urgent Care (no appt availability in office) / [x] Appointment(In office/virtual)/ []  [place] [facility]/ [] Home Care/ [] Refused Recommended Disposition /[] [place] [facility]/ []  Follow-up with PCP Additional Notes: Pt has had pain in her abdomen for awhile. She has lost 30+ lbs. She has pain whenever she eats anything spicy. She was taking sucralfate liquid which was helpful, but she is out. (She was calling to see if she could get a refill.) Pt would like referral to gastro. Pt is on her way to clinic for appt.    Reason for Disposition  [1] MODERATE pain (e.g., interferes with normal activities) AND [2] pain comes and goes (cramps) AND [3] present > 24 hours  (Exception: Pain with Vomiting or Diarrhea - see that Guideline.)  Answer Assessment - Initial Assessment Questions 1. LOCATION: "Where does it hurt?"      Around navel 3. ONSET: "When did the pain begin?" (e.g., minutes, hours or days ago)      Monday - anytime she eats spicy food 4. SUDDEN: "Gradual or sudden onset?"     *No Answer* 5. PATTERN "Does the pain come and go, or is it constant?"    - If it comes and goes: "How long does it last?" "Do you have pain now?"     (Note: Comes and goes means the pain is intermittent. It goes away completely between bouts.)    - If constant: "Is it getting better, staying the same, or getting worse?"      (Note: Constant means the pain never goes away completely; most serious pain is constant and gets worse.)      Comes and goes 6. SEVERITY: "How bad is the pain?"  (e.g., Scale 1-10; mild, moderate, or severe)    - MILD (1-3): Doesn't interfere with normal activities, abdomen soft and not tender to touch.     - MODERATE (4-7): Interferes with normal activities or awakens from sleep, abdomen tender to touch.     - SEVERE (8-10): Excruciating  pain, doubled over, unable to do any normal activities.       moderate 7. RECURRENT SYMPTOM: "Have you ever had this type of stomach pain before?" If Yes, ask: "When was the last time?" and "What happened that time?"      moderate 8. CAUSE: "What do you think is causing the stomach pain?"     Eating spicy food 9. RELIEVING/AGGRAVATING FACTORS: "What makes it better or worse?" (e.g., antacids, bending or twisting motion, bowel movement)     Spicy foods  Protocols used: Abdominal Pain - Female-A-AH

## 2023-06-02 NOTE — Telephone Encounter (Signed)
Pt is being seen in office today.

## 2023-06-02 NOTE — Patient Instructions (Signed)
How can I get wet again after menopause? Vaginal dryness Vaginal dryness after menopause: How to treat it? Vaginal moisturizers (K-Y Liquibeads, Replens, others). ... Vaginal lubricants (Astroglide, Sliquid, others). ...

## 2023-06-02 NOTE — Progress Notes (Signed)
[facility]  Heather Perry, is a 56 y.o. female  [id number]  [id number]  DOB - 15-Feb-1966  Chief Complaint  Patient presents with   Abdominal Pain    Starts out every time she eats food She is requesting a refill on sucralfate (CARAFATE) 1 GM/10ML suspension       Subjective:   Heather Perry is a 57 y.o. female here today for a follow up visit. Patient has No headache, No chest pain, No No Nausea, No new weakness tingling or numbness, No Cough - shortness of breath  No problems updated. Abdominal Pain This is a chronic problem. The current episode started more than 1 month ago. The onset quality is sudden. The problem occurs constantly. The most recent episode lasted 3 months. The problem has been gradually worsening. The pain is located in the periumbilical region and epigastric region. The pain is at a severity of 10/10. The pain is severe. The quality of the pain is aching and a sensation of fullness. The abdominal pain does not radiate. Associated symptoms include belching, flatus and weight loss. The pain is aggravated by eating (all foods spicy , milk product, everything). The pain is relieved by Certain positions, passing flatus and belching. She has tried antacids for the symptoms. The treatment provided moderate relief. Prior diagnostic workup includes GI consult. Her past medical history is significant for GERD.    Comprehensive ROS Pertinent positive and negative noted in HPI   Allergies  Allergen Reactions   Hydrocodone-Acetaminophen Itching    Started itching.  Pt states that she can take this medication Other reaction(s): Other (See Comments) Pruritus   Lortab [Hydrocodone-Acetaminophen]     Started itching   Niacin Hives   Penicillin G Hives and Nausea And Vomiting    Did it involve swelling of the face/tongue/throat, SOB, or low BP? No Did it involve sudden or severe rash/hives, skin peeling, or any reaction on the inside of your mouth or  nose? Yes Did you need to seek medical attention at a hospital or doctor's office? No When did it last happen?    Over 20 years Ago   If all above answers are "NO", may proceed with cephalosporin use.     Penicillins Itching    Past Medical History:  Diagnosis Date   Allergy    Anxiety    Arthritis    Asthma    Blood transfusion without reported diagnosis    Depression    GERD (gastroesophageal reflux disease)    High cholesterol    Hypertension    Obesity    Osteoporosis    Recurrent boils    Seizures (HCC)    last seizure "3 years ago" per pt 06-16-16 KBW   Sleep apnea    no CPAP used   Uterine fibroid     Current Outpatient Medications on File Prior to Visit  Medication Sig Dispense Refill   amLODipine (NORVASC) 5 MG tablet Take 1 tablet (5 mg total) by mouth daily. 90 tablet 1   aspirin EC 81 MG tablet Take 81 mg by mouth daily.     atorvastatin (LIPITOR) 20 MG tablet TAKE ONE TABLET BY MOUTH ONCE DAILY FOR CHOLESTEROL 90 tablet 1   budesonide-formoterol (SYMBICORT) 160-4.5 MCG/ACT inhaler Inhale 2 puffs into the lungs 2 (two) times daily. (Patient taking differently: Inhale 2 puffs into the lungs 2 (two) times daily as needed (wheezing, sob).) 1 Inhaler 1   celecoxib (CELEBREX) 100 MG capsule Take 1 capsule (100 mg total)  by mouth 2 (two) times daily. 180 capsule 1   cetirizine (ZYRTEC) 10 MG tablet TAKE ONE TABLET BY MOUTH ONCE DAILY FOR ALLERGIES 90 tablet 1   DULoxetine (CYMBALTA) 20 MG capsule Take 1 capsule (20 mg total) by mouth daily. 90 capsule 1   esomeprazole (NEXIUM) 40 MG capsule TAKE ONE CAPSULE AS NEEDED FOR INDIGESTION 90 capsule 1   fluticasone (FLONASE) 50 MCG/ACT nasal spray PLACE 2 SPRAYS INTO BOTH NOSTRILS DAILY. 16 g 0   hydrochlorothiazide (HYDRODIURIL) 25 MG tablet Take 1 tablet (25 mg total) by mouth daily. 90 tablet 3   methocarbamol (ROBAXIN) 500 MG tablet Take 1 tablet (500 mg total) by mouth every 8 (eight) hours as needed for muscle spasms.  120 tablet 1   sucralfate (CARAFATE) 1 GM/10ML suspension Take 10 mLs (1 g total) by mouth with breakfast, with lunch, and with evening meal. 420 mL 1   triamcinolone cream (KENALOG) 0.1 % Apply 1 Application topically 3 (three) times daily. 453.6 g 1   VENTOLIN HFA 108 (90 Base) MCG/ACT inhaler INHALE 2 PUFFS BY MOUTH EVERY 6 HOURS AS NEEDED FOR SHORTNESS OF BREATH OR WHEEZING. 18 g 1   No current facility-administered medications on file prior to visit.   Health Maintenance  Topic Date Due   COVID-19 Vaccine (1) Never done   Zoster (Shingles) Vaccine (1 of 2) Never done   Mammogram  05/09/2024   Colon Cancer Screening  06/30/2026   DTaP/Tdap/Td vaccine (2 - Td or Tdap) 01/12/2027   Pap with HPV screening  09/28/2027   Flu Shot  Completed   Hepatitis C Screening  Completed   HIV Screening  Completed   HPV Vaccine  Aged Out    Objective:   Vitals:   06/02/23 1004  BP: 119/84  Pulse: 83  Resp: 16  SpO2: 99%  Weight: 202 lb 9.6 oz (91.9 kg)     Physical Exam Vitals reviewed.  Constitutional:      Appearance: She is obese.  HENT:     Head: Normocephalic.  Cardiovascular:     Rate and Rhythm: Normal rate and regular rhythm.  Pulmonary:     Effort: Pulmonary effort is normal.     Breath sounds: Normal breath sounds.  Abdominal:     General: Bowel sounds are normal. There is distension.     Tenderness: There is abdominal tenderness in the epigastric area and periumbilical area.  Skin:    Findings: Rash present.  Neurological:     Mental Status: She is oriented to person, place, and time.  Psychiatric:        Mood and Affect: Mood normal.        Behavior: Behavior normal.      Assessment & Plan  Heather "D" was seen today for abdominal pain.  Diagnoses and all orders for this visit:  Rash and nonspecific skin eruption 2/2  Hidradenitis -     Ambulatory referral to Dermatology  Gastroesophageal reflux disease with esophagitis without hemorrhage -      sucralfate (CARAFATE) 1 GM/10ML suspension; Take 10 mLs (1 g total) by mouth with breakfast, with lunch, and with evening meal. -     Ambulatory referral to Dermatology    Patient have been counseled extensively about nutrition and exercise. Other issues discussed during this visit include: low cholesterol diet, weight control and daily exercise, foot care, annual eye examinations at Ophthalmology, importance of adherence with medications and regular follow-up. We also discussed long term complications of uncontrolled  diabetes and hypertension.   Keep scheduled appt  The patient was given clear instructions to go to ER or return to medical center if symptoms don't improve, worsen or new problems develop. The patient verbalized understanding. The patient was told to call to get lab results if they haven't heard anything in the next week.   This note has been created with Education officer, environmental. Any transcriptional errors are unintentional.   Grayce Sessions, NP 06/02/2023, 10:36 AM

## 2023-06-05 ENCOUNTER — Telehealth (INDEPENDENT_AMBULATORY_CARE_PROVIDER_SITE_OTHER): Payer: Self-pay | Admitting: Primary Care

## 2023-06-05 NOTE — Telephone Encounter (Signed)
Will forward to referral coordinator.

## 2023-06-05 NOTE — Telephone Encounter (Signed)
Pt is calling to report that  Dermatology Basilio Cairo, MD  Did not have any appts until March. Pt is requesting if another dermatology referral could be placed. Please advise

## 2023-06-09 ENCOUNTER — Other Ambulatory Visit (INDEPENDENT_AMBULATORY_CARE_PROVIDER_SITE_OTHER): Payer: Self-pay | Admitting: Primary Care

## 2023-06-15 ENCOUNTER — Ambulatory Visit (INDEPENDENT_AMBULATORY_CARE_PROVIDER_SITE_OTHER): Payer: Medicaid Other | Admitting: Primary Care

## 2023-06-17 ENCOUNTER — Other Ambulatory Visit (INDEPENDENT_AMBULATORY_CARE_PROVIDER_SITE_OTHER): Payer: Self-pay | Admitting: Primary Care

## 2023-06-17 DIAGNOSIS — K2100 Gastro-esophageal reflux disease with esophagitis, without bleeding: Secondary | ICD-10-CM

## 2023-06-19 ENCOUNTER — Ambulatory Visit (INDEPENDENT_AMBULATORY_CARE_PROVIDER_SITE_OTHER): Payer: Self-pay

## 2023-06-19 NOTE — Telephone Encounter (Signed)
Message from [place] P sent at 06/19/2023 11:32 AM EST  Summary: stye   Pt called saying she has a stye on both eyes.  She said she went to the pharmacy and he told her to call her primary and they could call hr in something.  CB@  [phone]         Chief Complaint: stye to both eyes right larger than left, right has a white top to ut. Hurts to blink Symptoms: pain to stye and hurts to blink Frequency: 7 days Pertinent Negatives: Patient denies fever Disposition: [] ED /[] Urgent Care (no appt availability in office) / [] Appointment(In office/virtual)/ []  [place] [facility]/ [] Home Care/ [x] Refused Recommended Disposition /[] [place] [facility]/ []  Follow-up with PCP Additional Notes: refused Mobile Bus since no appts- wanting med called in. Advised providers require appt. Pt hung up.  Reason for Disposition  2 or more styes are present  Answer Assessment - Initial Assessment Questions 1. LOCATION: "Which eye has the sty?" "Upper or lower eyelid?"     Both eyes 2. SIZE: "How big is it?" (Note: standard pencil eraser is 6 mm)     Left small//no right bigger  looks like pus on top 3. EYELID: "Is the eyelid swollen?" If Yes, ask: "How much?"     no 4. REDNESS: "Has the redness spread onto the eyelid?"     no 5. ONSET: "When did you notice the sty?"     7 days  6. VISION: "Do you have blurred vision?"      no 7. PAIN: "Is it painful?" If Yes, ask: "How bad is the pain?"  (Scale 1-10; or mild, moderate, severe)     Right worse than left  8. CONTACTS: "Do you wear contacts?"     no 9. OTHER SYMPTOMS: "Do you have any other symptoms?" (e.g., fever)     Cold  10. PREGNANCY: "Is there any chance you are pregnant?" "When was your last menstrual period?"       N/a  Protocols used: Sty-A-AH

## 2023-06-22 NOTE — Telephone Encounter (Signed)
 Requested Prescriptions  Pending Prescriptions Disp Refills   sucralfate  (CARAFATE ) 1 GM/10ML suspension [Pharmacy Med Name: SUCRALFATE  1 GM/10ML ORAL SUSPENSION] 420 mL 5    Sig: TAKE 10 MLS (1 G TOTAL) BY MOUTH WITH BREAKFAST, WITH LUNCH, AND WITH EVENING MEAL.     Gastroenterology: Antiacids Passed - 06/22/2023 12:10 PM      Passed - Valid encounter within last 12 months    Recent Outpatient Visits           2 weeks ago Rash and nonspecific skin eruption   [place] [facility] Celestia Rosaline SQUIBB, NP   1 month ago Anxiety associated with depression   [place] [facility] Celestia Rosaline SQUIBB, NP   3 months ago Encounter for immunization   [place] [facility] Celestia Rosaline SQUIBB, NP   4 months ago Hospital discharge follow-up   [place] [facility] Celestia Rosaline SQUIBB, NP   4 months ago Primary insomnia   [place] [facility] Celestia Rosaline SQUIBB, NP       Future Appointments             In 2 months Celestia, Rosaline SQUIBB, NP [place] [facility]

## 2023-07-10 ENCOUNTER — Other Ambulatory Visit (INDEPENDENT_AMBULATORY_CARE_PROVIDER_SITE_OTHER): Payer: Self-pay | Admitting: Primary Care

## 2023-07-10 DIAGNOSIS — R21 Rash and other nonspecific skin eruption: Secondary | ICD-10-CM

## 2023-07-10 NOTE — Telephone Encounter (Signed)
Requested medication (s) are due for refill today - unsure  Requested medication (s) are on the active medication list -yes  Future visit scheduled -yes  Last refill: 03/15/23   453.6g 1RF  Notes to clinic: non delegated Rx  Requested Prescriptions  Pending Prescriptions Disp Refills   triamcinolone cream (KENALOG) 0.1 % [Pharmacy Med Name: TRIAMCINOLONE ACETONIDE 0.1 % EXTERNAL CREAM] 454 g     Sig: Apply 1 Application topically 3 (three) times daily.     Not Delegated - Dermatology:  Corticosteroids Failed - 07/10/2023  4:28 PM      Failed - This refill cannot be delegated      Passed - Valid encounter within last 12 months    Recent Outpatient Visits           1 month ago Rash and nonspecific skin eruption   [place] [facility] Grayce Sessions, NP   2 months ago Anxiety associated with depression   [place] [facility] Grayce Sessions, NP   3 months ago Encounter for immunization   [place] [facility] Grayce Sessions, NP   5 months ago Hospital discharge follow-up   [place] [facility] Grayce Sessions, NP   5 months ago Primary insomnia   [place] [facility] Grayce Sessions, NP       Future Appointments             In 1 month Randa Evens, Kinnie Scales, NP [place] [facility]               Requested Prescriptions  Pending Prescriptions Disp Refills   triamcinolone cream (KENALOG) 0.1 % [Pharmacy Med Name: TRIAMCINOLONE ACETONIDE 0.1 % EXTERNAL CREAM] 454 g     Sig: Apply 1 Application topically 3 (three) times daily.     Not Delegated - Dermatology:  Corticosteroids Failed - 07/10/2023  4:28 PM      Failed - This refill cannot be delegated      Passed - Valid encounter within last 12 months    Recent Outpatient Visits           1 month ago Rash and nonspecific skin eruption   [place] [facility] Grayce Sessions, NP   2 months ago Anxiety associated with depression   [place] [facility] Grayce Sessions, NP   3 months ago Encounter for immunization   [place] [facility] Grayce Sessions, NP   5 months ago Hospital discharge follow-up    [facility] Grayce Sessions, NP   5 months ago Primary insomnia    [facility] Grayce Sessions, NP       Future Appointments             In 1 month Randa Evens, Kinnie Scales, NP  [facility]

## 2023-07-24 ENCOUNTER — Other Ambulatory Visit (INDEPENDENT_AMBULATORY_CARE_PROVIDER_SITE_OTHER): Payer: Self-pay | Admitting: Primary Care

## 2023-07-24 DIAGNOSIS — M51369 Other intervertebral disc degeneration, lumbar region without mention of lumbar back pain or lower extremity pain: Secondary | ICD-10-CM

## 2023-07-24 DIAGNOSIS — J45909 Unspecified asthma, uncomplicated: Secondary | ICD-10-CM

## 2023-07-24 DIAGNOSIS — I10 Essential (primary) hypertension: Secondary | ICD-10-CM

## 2023-07-25 NOTE — Telephone Encounter (Signed)
 Requested by interface surescripts. Future visit in 1 month.  Requested Prescriptions  Pending Prescriptions Disp Refills   amLODipine  (NORVASC ) 5 MG tablet [Pharmacy Med Name: AMLODIPINE  BESYLATE 5 MG ORAL TABLET] 90 tablet 0    Sig: TAKE 1 TABLET (5 MG TOTAL) BY MOUTH DAILY.     Cardiovascular: Calcium  Channel Blockers 2 Passed - 07/25/2023  1:25 PM      Passed - Last BP in normal range    BP Readings from Last 1 Encounters:  06/02/23 119/84         Passed - Last Heart Rate in normal range    Pulse Readings from Last 1 Encounters:  06/02/23 83         Passed - Valid encounter within last 6 months    Recent Outpatient Visits           1 month ago Rash and nonspecific skin eruption   [place] [facility] Celestia Rosaline SQUIBB, NP   3 months ago Anxiety associated with depression   [place] [facility] Celestia Rosaline SQUIBB, NP   4 months ago Encounter for immunization   [place] [facility] Celestia Rosaline SQUIBB, NP   5 months ago Hospital discharge follow-up   [place] [facility] Celestia Rosaline SQUIBB, NP   5 months ago Primary insomnia   [place] [facility] Celestia Rosaline SQUIBB, NP       Future Appointments             In 1 month Celestia, Rosaline SQUIBB, NP [place] [facility]             methocarbamol  (ROBAXIN ) 500 MG tablet [Pharmacy Med Name: METHOCARBAMOL  500 MG ORAL TABLET] 120 tablet 1    Sig: TAKE 1 TABLET (500 MG TOTAL) BY MOUTH EVERY 8 (EIGHT) HOURS AS NEEDED FOR MUSCLE SPASMS.     Not Delegated - Analgesics:  Muscle Relaxants Failed - 07/25/2023  1:25 PM      Failed - This refill cannot be delegated      Passed - Valid encounter within last 6 months    Recent Outpatient Visits           1 month ago Rash and nonspecific skin eruption   [place] [facility] Celestia Rosaline SQUIBB, NP   3 months ago Anxiety associated with depression    [place] [facility] Celestia Rosaline SQUIBB, NP   4 months ago Encounter for immunization   [place] [facility] Celestia Rosaline SQUIBB, NP   5 months ago Hospital discharge follow-up   [place] [facility] Celestia Rosaline SQUIBB, NP   5 months ago Primary insomnia   [place] [facility] Celestia Rosaline SQUIBB, NP       Future Appointments             In 1 month Celestia, Rosaline SQUIBB, NP  [facility]             VENTOLIN  HFA 108 [phone] Base) MCG/ACT inhaler [Pharmacy Med Name: VENTOLIN  HFA 108 (90 BASE) MCG/ACT INHALATION AEROSOL SOLUTION] 18 g 0    Sig: INHALE 2 PUFFS BY MOUTH EVERY 6 HOURS AS NEEDED FOR SHORTNESS OF BREATH OR WHEEZING.     Pulmonology:  Beta Agonists 2 Passed - 07/25/2023  1:25 PM      Passed - Last BP in normal range    BP Readings from Last 1 Encounters:  06/02/23 119/84         Passed - Last Heart Rate in normal range    Pulse Readings from Last 1 Encounters:  06/02/23 83         Passed - Valid encounter within last 12 months    Recent Outpatient Visits           1 month ago Rash and nonspecific skin eruption   [place] [facility] Celestia Rosaline SQUIBB, NP   3 months ago Anxiety associated with depression   [place] [facility] Celestia Rosaline SQUIBB, NP   4 months ago Encounter for immunization   [place] [facility] Celestia Rosaline SQUIBB, NP   5 months ago Hospital discharge follow-up   [place] [facility] Celestia Rosaline SQUIBB, NP   5 months ago Primary insomnia   [place] [facility] Celestia Rosaline SQUIBB, NP       Future Appointments             In 1 month Celestia, Rosaline SQUIBB, NP  [facility]

## 2023-07-25 NOTE — Telephone Encounter (Signed)
 Will forward to provider

## 2023-07-25 NOTE — Telephone Encounter (Signed)
 Requested medication (s) are due for refill today: yes   Requested medication (s) are on the active medication list: yes   Last refill:  01/30/23 #120 1 refills  Future visit scheduled: yes in 1 month  Notes to clinic:  not delegated per protocol. Do you want to refill Rx?     Requested Prescriptions  Pending Prescriptions Disp Refills   methocarbamol  (ROBAXIN ) 500 MG tablet [Pharmacy Med Name: METHOCARBAMOL  500 MG ORAL TABLET] 120 tablet 1    Sig: TAKE 1 TABLET (500 MG TOTAL) BY MOUTH EVERY 8 (EIGHT) HOURS AS NEEDED FOR MUSCLE SPASMS.     Not Delegated - Analgesics:  Muscle Relaxants Failed - 07/25/2023  1:26 PM      Failed - This refill cannot be delegated      Passed - Valid encounter within last 6 months    Recent Outpatient Visits           1 month ago Rash and nonspecific skin eruption   [place] [facility] Celestia Rosaline SQUIBB, NP   3 months ago Anxiety associated with depression   [place] [facility] Celestia Rosaline SQUIBB, NP   4 months ago Encounter for immunization   [place] [facility] Celestia Rosaline SQUIBB, NP   5 months ago Hospital discharge follow-up   [place] [facility] Celestia Rosaline SQUIBB, NP   5 months ago Primary insomnia   [place] [facility] Celestia Rosaline SQUIBB, NP       Future Appointments             In 1 month Celestia, Rosaline SQUIBB, NP [place] [facility]            Signed Prescriptions Disp Refills   amLODipine  (NORVASC ) 5 MG tablet 90 tablet 0    Sig: TAKE 1 TABLET (5 MG TOTAL) BY MOUTH DAILY.     Cardiovascular: Calcium  Channel Blockers 2 Passed - 07/25/2023  1:26 PM      Passed - Last BP in normal range    BP Readings from Last 1 Encounters:  06/02/23 119/84         Passed - Last Heart Rate in normal range    Pulse Readings from Last 1 Encounters:  06/02/23 83         Passed - Valid encounter within last 6 months     Recent Outpatient Visits           1 month ago Rash and nonspecific skin eruption   [place] [facility] Celestia Rosaline SQUIBB, NP   3 months ago Anxiety associated with depression   [place] [facility] Celestia Rosaline SQUIBB, NP   4 months ago Encounter for immunization   [place] [facility] Celestia Rosaline SQUIBB, NP   5 months ago Hospital discharge follow-up   [place] [facility] Celestia Rosaline SQUIBB, NP   5 months ago Primary insomnia   [place] [facility] Celestia Rosaline SQUIBB, NP       Future Appointments             In 1 month Celestia, Rosaline SQUIBB, NP [place] [facility]             VENTOLIN  HFA 108 (90 Base) MCG/ACT inhaler 18 g 0    Sig: INHALE 2 PUFFS BY MOUTH EVERY 6 HOURS AS NEEDED FOR SHORTNESS OF BREATH OR WHEEZING.     Pulmonology:  Beta Agonists 2 Passed - 07/25/2023  1:26 PM      Passed - Last BP in normal range    BP Readings from Last 1 Encounters:  06/02/23 119/84         Passed - Last Heart Rate in normal range    Pulse Readings from Last 1 Encounters:  06/02/23 83         Passed - Valid encounter within last 12 months    Recent Outpatient Visits           1 month ago Rash and nonspecific skin eruption   [place] [facility] Celestia Rosaline SQUIBB, NP   3 months ago Anxiety associated with depression   [place] [facility] Celestia Rosaline SQUIBB, NP   4 months ago Encounter for immunization   [place] [facility] Celestia Rosaline SQUIBB, NP   5 months ago Hospital discharge follow-up   [place] [facility] Celestia Rosaline SQUIBB, NP   5 months ago Primary insomnia   [place] [facility] Celestia Rosaline SQUIBB, NP       Future Appointments             In 1 month Celestia, Rosaline SQUIBB, NP [place] [facility]

## 2023-08-16 ENCOUNTER — Emergency Department (HOSPITAL_COMMUNITY)
Admission: EM | Admit: 2023-08-16 | Discharge: 2023-08-16 | Discharge status: Against Medical Advice | Payer: Medicaid Other

## 2023-08-16 NOTE — ED Notes (Signed)
 Pt stated she is going to Urgent Care

## 2023-08-22 ENCOUNTER — Other Ambulatory Visit (INDEPENDENT_AMBULATORY_CARE_PROVIDER_SITE_OTHER): Payer: Self-pay | Admitting: Primary Care

## 2023-08-22 ENCOUNTER — Ambulatory Visit (INDEPENDENT_AMBULATORY_CARE_PROVIDER_SITE_OTHER): Payer: Self-pay | Admitting: Primary Care

## 2023-08-22 ENCOUNTER — Telehealth (INDEPENDENT_AMBULATORY_CARE_PROVIDER_SITE_OTHER): Payer: Self-pay | Admitting: Primary Care

## 2023-08-22 DIAGNOSIS — U071 COVID-19: Secondary | ICD-10-CM

## 2023-08-22 MED ORDER — NIRMATRELVIR/RITONAVIR (PAXLOVID) TABLET (RENAL DOSING): 2.0000 | ORAL_TABLET | Freq: Two times a day (BID) | ORAL | 0 refills | Status: AC

## 2023-08-22 NOTE — Telephone Encounter (Signed)
  Chief Complaint: Medication problem  Disposition: [] ED /[] Urgent Care (no appt availability in office) / [] Appointment(In office/virtual)/ []  [place] [facility]/ [] Home Care/ [] Refused Recommended Disposition /[] [place] [facility]/ [x]  Follow-up with PCP Additional Notes: Patient calls stating her insurance will not cover paxlovid and that the pharmacy is requesting another medication for covid symptoms be sent in. This RN attempted to contact [employer], no answer. Alerting PCP for review and f/u.   Reason for Disposition  [1] Caller has URGENT medicine question about med that PCP or specialist prescribed AND [2] triager unable to answer question  Answer Assessment - Initial Assessment Questions 1. COVID-19 DIAGNOSIS: "How do you know that you have COVID?" (e.g., positive lab test or self-test, diagnosed by doctor or NP/PA, symptoms after exposure).     Home test 2. COVID-19 EXPOSURE: "Was there any known exposure to COVID before the symptoms began?" CDC Definition of close contact: within 6 feet (2 meters) for a total of 15 minutes or more over a 24-hour period.      Boyfriend has covid 3. ONSET: "When did the COVID-19 symptoms start?"      2 days ago 4. WORST SYMPTOM: "What is your worst symptom?" (e.g., cough, fever, shortness of breath, muscle aches)     Everything, body aches, cough, fever, chills 5. COUGH: "Do you have a cough?" If Yes, ask: "How bad is the cough?"       Real bad, states productive one day 6. FEVER: "Do you have a fever?" If Yes, ask: "What is your temperature, how was it measured, and when did it start?"     Has not checked, but feels hot 7. RESPIRATORY STATUS: "Describe your breathing?" (e.g., normal; shortness of breath, wheezing, unable to speak)      SOB with activity 8. BETTER-SAME-WORSE: "Are you getting better, staying the same or getting worse compared to yesterday?"  If getting worse, ask, "In what way?"     better 9. OTHER SYMPTOMS: "Do you have any  other symptoms?"  (e.g., chills, fatigue, headache, loss of smell or taste, muscle pain, sore throat)     Chills, muscle pain, headache, sore throat 10. HIGH RISK DISEASE: "Do you have any chronic medical problems?" (e.g., asthma, heart or lung disease, weak immune system, obesity, etc.)       Asthma, HTN 11. VACCINE: "Have you had the COVID-19 vaccine?" If Yes, ask: "Which one, how many shots, when did you get it?"       Yes, three, unsure of dates 47. PREGNANCY: "Is there any chance you are pregnant?" "When was your last menstrual period?"       NA 13. O2 SATURATION MONITOR:  "Do you use an oxygen saturation monitor (pulse oximeter) at home?" If Yes, ask "What is your reading (oxygen level) today?" "What is your usual oxygen saturation reading?" (e.g., 95%)       Unable to monitor  Answer Assessment - Initial Assessment Questions 1. NAME of MEDICINE: "What medicine(s) are you calling about?"     Paxlovid 2. QUESTION: "What is your question?" (e.g., double dose of medicine, side effect)     States insurance will not cover it and pharmacy is requesting PCP call back 3. PRESCRIBER: "Who prescribed the medicine?" Reason: if prescribed by specialist, call should be referred to that group.     PCP  Protocols used: Coronavirus (COVID-19) Diagnosed or Suspected-A-AH, Medication Question Call-A-AH

## 2023-08-22 NOTE — Telephone Encounter (Signed)
 1st attempt, no voicemail option available.  Copied from CRM [id number]. Topic: Clinical - Medication Question >> Aug 22, 2023 10:11 AM Macon Large wrote: Reason for CRM: Patient stated that she tested positive for Covid today. Patient requests that a prescription be sent to [facility] - [place], [place] - [address]  Phone: [phone]  Fax: [phone]

## 2023-08-22 NOTE — Progress Notes (Signed)
 [facility]  Visit via Telephone Note  I connected with [name], on 08/22/2023 at 11:50 AM by telephone and verified that I am speaking with the correct person using two identifiers.   Consent: I discussed the limitations, risks, security and privacy concerns of performing an evaluation and management service by telephone and the availability of in person appointments. I also discussed with the patient that there may be a patient responsible charge related to this service. The patient expressed understanding and agreed to proceed.   Location of Patient: Home  Location of Provider: [facility]   Persons participating in visit: Rozell Searing,  NP   History of Present Illness: Heather Perry is a 58 year old female having a telephone visit and also called and left a message with the triage nurse.  She complains of fever, chills, body aches, cough and sore throat.  Symptoms started 2 days ago.  Tested home kit for COVID states it is positive.  Her and her boyfriend are COVID-positive.  Informed patient we will treat her for COVID.   Past Medical History:  Diagnosis Date   Allergy    Anxiety    Arthritis    Asthma    Blood transfusion without reported diagnosis    Depression    GERD (gastroesophageal reflux disease)    High cholesterol    Hypertension    Obesity    Osteoporosis    Recurrent boils    Seizures (HCC)    last seizure "3 years ago" per pt 06-16-16 KBW   Sleep apnea    no CPAP used   Uterine fibroid    Allergies  Allergen Reactions   Hydrocodone-Acetaminophen Itching    Started itching.  Pt states that she can take this medication Other reaction(s): Other (See Comments) Pruritus   Lortab [Hydrocodone-Acetaminophen]     Started itching   Niacin Hives   Penicillin G Hives and Nausea And Vomiting    Did it involve swelling of the face/tongue/throat, SOB, or low BP? No Did  it involve sudden or severe rash/hives, skin peeling, or any reaction on the inside of your mouth or nose? Yes Did you need to seek medical attention at a hospital or doctor's office? No When did it last happen?    Over 20 years Ago   If all above answers are "NO", may proceed with cephalosporin use.     Penicillins Itching    Current Outpatient Medications on File Prior to Visit  Medication Sig Dispense Refill   amLODipine (NORVASC) 5 MG tablet TAKE 1 TABLET (5 MG TOTAL) BY MOUTH DAILY. 90 tablet 0   aspirin EC 81 MG tablet Take 81 mg by mouth daily.     atorvastatin (LIPITOR) 20 MG tablet TAKE ONE TABLET BY MOUTH ONCE DAILY FOR CHOLESTEROL 90 tablet 1   budesonide-formoterol (SYMBICORT) 160-4.5 MCG/ACT inhaler Inhale 2 puffs into the lungs 2 (two) times daily. (Patient taking differently: Inhale 2 puffs into the lungs 2 (two) times daily as needed (wheezing, sob).) 1 Inhaler 1   celecoxib (CELEBREX) 100 MG capsule Take 1 capsule (100 mg total) by mouth 2 (two) times daily. 180 capsule 1   cetirizine (ZYRTEC) 10 MG tablet TAKE ONE TABLET BY MOUTH ONCE DAILY FOR ALLERGIES 90 tablet 1   DULoxetine (CYMBALTA) 20 MG capsule Take 1 capsule (20 mg total) by mouth daily. 90 capsule 1   esomeprazole (NEXIUM) 40 MG capsule TAKE ONE CAPSULE AS NEEDED FOR  INDIGESTION 90 capsule 1   fluticasone (FLONASE) 50 MCG/ACT nasal spray PLACE 2 SPRAYS INTO BOTH NOSTRILS DAILY. 16 g 0   hydrochlorothiazide (HYDRODIURIL) 25 MG tablet Take 1 tablet (25 mg total) by mouth daily. 90 tablet 3   methocarbamol (ROBAXIN) 500 MG tablet TAKE 1 TABLET (500 MG TOTAL) BY MOUTH EVERY 8 (EIGHT) HOURS AS NEEDED FOR MUSCLE SPASMS. 120 tablet 1   sucralfate (CARAFATE) 1 GM/10ML suspension TAKE 10 MLS (1 G TOTAL) BY MOUTH WITH BREAKFAST, WITH LUNCH, AND WITH EVENING MEAL. 420 mL 5   triamcinolone cream (KENALOG) 0.1 % APPLY 1 APPLICATION TOPICALLY 3 (THREE) TIMES DAILY. 454 g 0   VENTOLIN HFA 108 (90 Base) MCG/ACT inhaler INHALE 2  PUFFS BY MOUTH EVERY 6 HOURS AS NEEDED FOR SHORTNESS OF BREATH OR WHEEZING. 18 g 0   No current facility-administered medications on file prior to visit.    Observations/Objective: See HPI Assessment and Plan: Diagnoses and all orders for this visit:  COVID  Other orders -     nirmatrelvir/ritonavir, renal dosing, (PAXLOVID) 10 x 150 MG & 10 x 100MG  TABS; Take 2 tablets by mouth 2 (two) times daily for 5 days. (Take nirmatrelvir 150 mg one tablet twice daily for 5 days and ritonavir 100 mg one tablet twice daily for 5 days) Patient GFR is 60     Follow Up Instructions:    I discussed the assessment and treatment plan with the patient. The patient was provided an opportunity to ask questions and all were answered. The patient agreed with the plan and demonstrated an understanding of the instructions.   The patient was advised to call back or seek an in-person evaluation if the symptoms worsen or if the condition fails to improve as anticipated.     I provided 12 minutes total time during this encounter including median intraservice time, reviewing previous notes, investigations, ordering medications, medical decision making, coordinating care and patient verbalized understanding at the end of the visit.    This note has been created with Education officer, environmental. Any transcriptional errors are unintentional.   Grayce Sessions, NP 08/22/2023, 11:50 AM

## 2023-08-22 NOTE — Telephone Encounter (Signed)
 Pharmacy requesting clarification, routing to provider for review.  Copied from CRM [id number]. Topic: Clinical - Prescription Issue >> Aug 22, 2023  4:58 PM Higinio Roger wrote: Reason for CRM: [facility] needs clarification on Paxlovid medication for patient. Pharmacy stated it will come in 30 tab instead of 20 tab. Callback #: [phone]

## 2023-08-28 ENCOUNTER — Other Ambulatory Visit (INDEPENDENT_AMBULATORY_CARE_PROVIDER_SITE_OTHER): Payer: Self-pay | Admitting: Primary Care

## 2023-08-28 DIAGNOSIS — F5101 Primary insomnia: Secondary | ICD-10-CM

## 2023-08-28 DIAGNOSIS — G8929 Other chronic pain: Secondary | ICD-10-CM

## 2023-08-28 NOTE — Telephone Encounter (Signed)
 Will forward to provider

## 2023-08-28 NOTE — Telephone Encounter (Signed)
 Late entry spoke with pharmacist stated during COVID they had 20 pills now only 30 can be dispense. VO given

## 2023-09-05 ENCOUNTER — Telehealth (INDEPENDENT_AMBULATORY_CARE_PROVIDER_SITE_OTHER): Payer: Self-pay | Admitting: Primary Care

## 2023-09-05 NOTE — Telephone Encounter (Signed)
 Spoke to pt about appt.. Will be present

## 2023-09-06 ENCOUNTER — Ambulatory Visit (INDEPENDENT_AMBULATORY_CARE_PROVIDER_SITE_OTHER): Payer: Medicaid Other | Admitting: Primary Care

## 2023-09-06 ENCOUNTER — Telehealth (INDEPENDENT_AMBULATORY_CARE_PROVIDER_SITE_OTHER): Payer: Self-pay | Admitting: Family Medicine

## 2023-09-06 NOTE — Telephone Encounter (Signed)
 Called pt to reschedule appt. Pt did not answer and appt was rescheduled.

## 2023-09-08 ENCOUNTER — Ambulatory Visit (INDEPENDENT_AMBULATORY_CARE_PROVIDER_SITE_OTHER): Payer: Medicaid Other | Admitting: Primary Care

## 2023-09-08 ENCOUNTER — Ambulatory Visit (INDEPENDENT_AMBULATORY_CARE_PROVIDER_SITE_OTHER): Admitting: Primary Care

## 2023-09-08 DIAGNOSIS — R052 Subacute cough: Secondary | ICD-10-CM

## 2023-09-08 DIAGNOSIS — U071 COVID-19: Secondary | ICD-10-CM | POA: Diagnosis not present

## 2023-09-08 DIAGNOSIS — R439 Unspecified disturbances of smell and taste: Secondary | ICD-10-CM

## 2023-09-08 NOTE — Progress Notes (Signed)
 [facility]  Visit via Telephone Note  I connected with Heather Perry, on 09/08/2023 at 10:30 by telephone and verified that I am speaking with the correct person using two identifiers.   Consent: I discussed the limitations, risks, security and privacy concerns of performing an evaluation and management service by telephone and the availability of in person appointments. I also discussed with the patient that there may be a patient responsible charge related to this service. The patient expressed understanding and agreed to proceed.   Location of Patient: Home  Location of Provider: [facility]   Persons participating in visit: Rozell Searing,  NP   History of Present Illness: Ms. Heather Perry is a 58 year old female who initially presented to the office for a visit that she was not aware of being canceled.  Patient was very upset because she had to pay for a cab for an appointment that she was not going to have.  Call patient but apologize for miscommunication and addressed her issues.  Since she has had COVID she has a decrease in smell and taste.  She stated she has had a problem with smelling prior to that has gotten worse.  She also has a cough and her pain management doctor prescribed medication but her insurance would not fill it.  Requesting something for cough.   Past Medical History:  Diagnosis Date   Allergy    Anxiety    Arthritis    Asthma    Blood transfusion without reported diagnosis    Depression    GERD (gastroesophageal reflux disease)    High cholesterol    Hypertension    Obesity    Osteoporosis    Recurrent boils    Seizures (HCC)    last seizure "3 years ago" per pt 06-16-16 KBW   Sleep apnea    no CPAP used   Uterine fibroid    Allergies  Allergen Reactions   Hydrocodone-Acetaminophen Itching    Started itching.  Pt states that she can take this medication Other  reaction(s): Other (See Comments) Pruritus   Lortab [Hydrocodone-Acetaminophen]     Started itching   Niacin Hives   Penicillin G Hives and Nausea And Vomiting    Did it involve swelling of the face/tongue/throat, SOB, or low BP? No Did it involve sudden or severe rash/hives, skin peeling, or any reaction on the inside of your mouth or nose? Yes Did you need to seek medical attention at a hospital or doctor's office? No When did it last happen?    Over 20 years Ago   If all above answers are "NO", may proceed with cephalosporin use.     Penicillins Itching    Current Outpatient Medications on File Prior to Visit  Medication Sig Dispense Refill   amLODipine (NORVASC) 5 MG tablet TAKE 1 TABLET (5 MG TOTAL) BY MOUTH DAILY. 90 tablet 0   aspirin EC 81 MG tablet Take 81 mg by mouth daily.     atorvastatin (LIPITOR) 20 MG tablet TAKE ONE TABLET BY MOUTH ONCE DAILY FOR CHOLESTEROL 90 tablet 1   budesonide-formoterol (SYMBICORT) 160-4.5 MCG/ACT inhaler Inhale 2 puffs into the lungs 2 (two) times daily. (Patient taking differently: Inhale 2 puffs into the lungs 2 (two) times daily as needed (wheezing, sob).) 1 Inhaler 1   celecoxib (CELEBREX) 100 MG capsule TAKE 1 CAPSULE (100 MG TOTAL) BY MOUTH 2 (TWO) TIMES DAILY. 180 capsule 1   cetirizine (ZYRTEC)  10 MG tablet TAKE ONE TABLET BY MOUTH ONCE DAILY FOR ALLERGIES 90 tablet 1   DULoxetine (CYMBALTA) 20 MG capsule Take 1 capsule (20 mg total) by mouth daily. 90 capsule 1   esomeprazole (NEXIUM) 40 MG capsule TAKE ONE CAPSULE AS NEEDED FOR INDIGESTION 90 capsule 1   fluticasone (FLONASE) 50 MCG/ACT nasal spray PLACE 2 SPRAYS INTO BOTH NOSTRILS DAILY. 16 g 0   hydrochlorothiazide (HYDRODIURIL) 25 MG tablet Take 1 tablet (25 mg total) by mouth daily. 90 tablet 3   methocarbamol (ROBAXIN) 500 MG tablet TAKE 1 TABLET (500 MG TOTAL) BY MOUTH EVERY 8 (EIGHT) HOURS AS NEEDED FOR MUSCLE SPASMS. 120 tablet 1   sucralfate (CARAFATE) 1 GM/10ML suspension TAKE  10 MLS (1 G TOTAL) BY MOUTH WITH BREAKFAST, WITH LUNCH, AND WITH EVENING MEAL. 420 mL 5   triamcinolone cream (KENALOG) 0.1 % APPLY 1 APPLICATION TOPICALLY 3 (THREE) TIMES DAILY. 454 g 0   VENTOLIN HFA 108 (90 Base) MCG/ACT inhaler INHALE 2 PUFFS BY MOUTH EVERY 6 HOURS AS NEEDED FOR SHORTNESS OF BREATH OR WHEEZING. 18 g 0   No current facility-administered medications on file prior to visit.    Observations/Objective: See HPI  Assessment and Plan: Diagnoses and all orders for this visit:  Subacute cough Lingering cough since she had COVID Prescribed Tessalon Perles 3 times daily as needed  COVID 2/2 Decreased taste and smell Recently was COVID positive and treated   Follow Up Instructions:    I discussed the assessment and treatment plan with the patient. The patient was provided an opportunity to ask questions and all were answered. The patient agreed with the plan and demonstrated an understanding of the instructions.   The patient was advised to call back or seek an in-person evaluation if the symptoms worsen or if the condition fails to improve as anticipated.     I provided 20 minutes total time during this encounter including median intraservice time, reviewing previous notes, investigations, ordering medications, medical decision making, coordinating care and patient verbalized understanding at the end of the visit.    This note has been created with Education officer, environmental. Any transcriptional errors are unintentional.   Grayce Sessions, NP 09/08/2023, 11:10 AM

## 2023-09-09 MED ORDER — BENZONATATE 100 MG PO CAPS: 100.0000 mg | ORAL_CAPSULE | Freq: Three times a day (TID) | ORAL | 0 refills | Status: DC | PRN

## 2023-09-11 ENCOUNTER — Telehealth (INDEPENDENT_AMBULATORY_CARE_PROVIDER_SITE_OTHER): Payer: Self-pay | Admitting: Primary Care

## 2023-09-11 NOTE — Telephone Encounter (Signed)
 Spoke to pt about appt.. Will be present

## 2023-09-19 ENCOUNTER — Ambulatory Visit (INDEPENDENT_AMBULATORY_CARE_PROVIDER_SITE_OTHER): Admitting: Primary Care

## 2023-09-25 ENCOUNTER — Encounter (INDEPENDENT_AMBULATORY_CARE_PROVIDER_SITE_OTHER): Payer: Self-pay | Admitting: Primary Care

## 2023-09-25 ENCOUNTER — Ambulatory Visit (INDEPENDENT_AMBULATORY_CARE_PROVIDER_SITE_OTHER): Admitting: Primary Care

## 2023-09-25 VITALS — BP 118/78 | HR 88 | Resp 16 | Wt 204.2 lb

## 2023-09-25 DIAGNOSIS — E785 Hyperlipidemia, unspecified: Secondary | ICD-10-CM

## 2023-09-25 DIAGNOSIS — F330 Major depressive disorder, recurrent, mild: Secondary | ICD-10-CM | POA: Diagnosis not present

## 2023-09-25 DIAGNOSIS — Z2821 Immunization not carried out because of patient refusal: Secondary | ICD-10-CM

## 2023-09-25 DIAGNOSIS — F418 Other specified anxiety disorders: Secondary | ICD-10-CM

## 2023-09-25 DIAGNOSIS — I10 Essential (primary) hypertension: Secondary | ICD-10-CM | POA: Diagnosis not present

## 2023-09-25 DIAGNOSIS — Z79899 Other long term (current) drug therapy: Secondary | ICD-10-CM

## 2023-09-25 MED ORDER — DULOXETINE HCL 30 MG PO CPEP: 30.0000 mg | ORAL_CAPSULE | Freq: Every day | ORAL | 0 refills | Status: DC

## 2023-09-25 NOTE — Progress Notes (Signed)
 [facility]  Heather Perry, is a 58 y.o. female  [id number]  [id number]  DOB - 1965-09-15  Medication  Subjective:   Heather Perry is a 58 y.o. female here today for a follow up visit for depression and anxiety if medication was affective. She states not helping as much and would like increase in does . Discuss with her therapy and she was in agreement and appt scheduled. Patient has No headache, No chest pain, No abdominal pain - No Nausea, No new weakness tingling or numbness, No Cough - shortness of breath   No problems updated.  Comprehensive ROS Pertinent positive and negative noted in HPI   Allergies  Allergen Reactions   Hydrocodone-Acetaminophen Itching    Started itching.  Pt states that she can take this medication Other reaction(s): Other (See Comments) Pruritus   Lortab [Hydrocodone-Acetaminophen]     Started itching   Niacin Hives   Penicillin G Hives and Nausea And Vomiting    Did it involve swelling of the face/tongue/throat, SOB, or low BP? No Did it involve sudden or severe rash/hives, skin peeling, or any reaction on the inside of your mouth or nose? Yes Did you need to seek medical attention at a hospital or doctor's office? No When did it last happen?    Over 20 years Ago   If all above answers are "NO", may proceed with cephalosporin use.     Penicillins Itching    Past Medical History:  Diagnosis Date   Allergy    Anxiety    Arthritis    Asthma    Blood transfusion without reported diagnosis    Depression    GERD (gastroesophageal reflux disease)    High cholesterol    Hypertension    Obesity    Osteoporosis    Recurrent boils    Seizures (HCC)    last seizure "3 years ago" per pt 06-16-16 KBW   Sleep apnea    no CPAP used   Uterine fibroid     Current Outpatient Medications on File Prior to Visit  Medication Sig Dispense Refill   amLODipine (NORVASC) 5 MG tablet TAKE 1 TABLET (5 MG TOTAL) BY MOUTH DAILY. 90  tablet 0   aspirin EC 81 MG tablet Take 81 mg by mouth daily.     atorvastatin (LIPITOR) 20 MG tablet TAKE ONE TABLET BY MOUTH ONCE DAILY FOR CHOLESTEROL 90 tablet 1   benzonatate (TESSALON PERLES) 100 MG capsule Take 1 capsule (100 mg total) by mouth 3 (three) times daily as needed for cough. 20 capsule 0   budesonide-formoterol (SYMBICORT) 160-4.5 MCG/ACT inhaler Inhale 2 puffs into the lungs 2 (two) times daily. (Patient taking differently: Inhale 2 puffs into the lungs 2 (two) times daily as needed (wheezing, sob).) 1 Inhaler 1   celecoxib (CELEBREX) 100 MG capsule TAKE 1 CAPSULE (100 MG TOTAL) BY MOUTH 2 (TWO) TIMES DAILY. 180 capsule 1   cetirizine (ZYRTEC) 10 MG tablet TAKE ONE TABLET BY MOUTH ONCE DAILY FOR ALLERGIES 90 tablet 1   esomeprazole (NEXIUM) 40 MG capsule TAKE ONE CAPSULE AS NEEDED FOR INDIGESTION 90 capsule 1   fluticasone (FLONASE) 50 MCG/ACT nasal spray PLACE 2 SPRAYS INTO BOTH NOSTRILS DAILY. 16 g 0   hydrochlorothiazide (HYDRODIURIL) 25 MG tablet Take 1 tablet (25 mg total) by mouth daily. 90 tablet 3   methocarbamol (ROBAXIN) 500 MG tablet TAKE 1 TABLET (500 MG TOTAL) BY MOUTH EVERY 8 (EIGHT) HOURS AS NEEDED FOR MUSCLE SPASMS. 120 tablet 1  sucralfate (CARAFATE) 1 GM/10ML suspension TAKE 10 MLS (1 G TOTAL) BY MOUTH WITH BREAKFAST, WITH LUNCH, AND WITH EVENING MEAL. 420 mL 5   triamcinolone cream (KENALOG) 0.1 % APPLY 1 APPLICATION TOPICALLY 3 (THREE) TIMES DAILY. 454 g 0   VENTOLIN HFA 108 (90 Base) MCG/ACT inhaler INHALE 2 PUFFS BY MOUTH EVERY 6 HOURS AS NEEDED FOR SHORTNESS OF BREATH OR WHEEZING. 18 g 0   No current facility-administered medications on file prior to visit.   Health Maintenance  Topic Date Due   COVID-19 Vaccine (1) Never done   Zoster (Shingles) Vaccine (1 of 2) 12/25/2023*   Pneumococcal Vaccination (1 of 2 - PCV) 09/24/2024*   Flu Shot  01/19/2024   Mammogram  05/09/2024   Colon Cancer Screening  06/30/2026   DTaP/Tdap/Td vaccine (2 - Td or  Tdap) 01/12/2027   Pap with HPV screening  09/28/2027   Hepatitis C Screening  Completed   HIV Screening  Completed   HPV Vaccine  Aged Out  *Topic was postponed. The date shown is not the original due date.    Objective:   Vitals:   09/25/23 1026  BP: 118/78  Pulse: 88  Resp: 16  SpO2: 97%  Weight: 204 lb 3.2 oz (92.6 kg)    Physical Exam Vitals reviewed.  Constitutional:      Appearance: She is obese.  HENT:     Right Ear: External ear normal.     Left Ear: External ear normal.  Eyes:     Extraocular Movements: Extraocular movements intact.  Cardiovascular:     Rate and Rhythm: Normal rate and regular rhythm.  Pulmonary:     Effort: Pulmonary effort is normal.     Breath sounds: Normal breath sounds.  Abdominal:     General: Bowel sounds are normal. There is distension.     Palpations: Abdomen is soft.  Musculoskeletal:        General: Normal range of motion.     Cervical back: Normal range of motion and neck supple.  Skin:    General: Skin is warm and dry.  Neurological:     Mental Status: She is oriented to person, place, and time.  Psychiatric:        Mood and Affect: Mood normal.        Behavior: Behavior normal.     Assessment & Plan  Diagnoses and all orders for this visit:  Pneumococcal vaccination declined  Herpes zoster vaccination declined  MDD (major depressive disorder), recurrent episode, mild (HCC) 10/09/23 @ 1:15 Increase Cymbalta 30mg   -     Ambulatory referral to Psychiatry  Essential hypertension Well controlled  DIET: Limit salt intake, read nutrition labels to check salt content, limit fried and high fatty foods  Avoid using multisymptom OTC cold preparations that generally contain sudafed which can rise BP. Consult with pharmacist on best cold relief products to use for persons with HTN EXERCISE Discussed incorporating exercise such as walking - 30 minutes most days of the week and can do in 10 minute intervals    -      CMP14+EGFR  Anxiety associated with depression ncrease Cymbalta 30mg   -     Ambulatory referral to Psychiatry  Hyperlipidemia with target LDL less than 100 -     Lipid panel  Medication management -     CBC with Differential/Platelet -     CMP14+EGFR -     Lipid panel -     Ambulatory referral to Psychiatry  Other  orders -     DULoxetine (CYMBALTA) 30 MG capsule; Take 1 capsule (30 mg total) by mouth daily.    Patient have been counseled extensively about nutrition and exercise. Other issues discussed during this visit include: low cholesterol diet, weight control and daily exercise, foot care, annual eye examinations at Ophthalmology, importance of adherence with medications and regular follow-up. We also discussed long term complications of uncontrolled diabetes and hypertension.   Return in about 2 months (around 11/25/2023) for increase medication.  The patient was given clear instructions to go to ER or return to medical center if symptoms don't improve, worsen or new problems develop. The patient verbalized understanding. The patient was told to call to get lab results if they haven't heard anything in the next week.   This note has been created with Education officer, environmental. Any transcriptional errors are unintentional.   Grayce Sessions, NP 09/25/2023, 10:57 AM

## 2023-10-12 ENCOUNTER — Other Ambulatory Visit (INDEPENDENT_AMBULATORY_CARE_PROVIDER_SITE_OTHER): Payer: Self-pay

## 2023-10-12 DIAGNOSIS — J45909 Unspecified asthma, uncomplicated: Secondary | ICD-10-CM

## 2023-10-12 MED ORDER — VENTOLIN HFA 108 (90 BASE) MCG/ACT IN AERS: INHALATION_SPRAY | RESPIRATORY_TRACT | 2 refills | Status: DC

## 2023-11-07 ENCOUNTER — Other Ambulatory Visit (INDEPENDENT_AMBULATORY_CARE_PROVIDER_SITE_OTHER): Payer: Self-pay | Admitting: Primary Care

## 2023-11-07 DIAGNOSIS — I10 Essential (primary) hypertension: Secondary | ICD-10-CM

## 2023-11-07 DIAGNOSIS — M51369 Other intervertebral disc degeneration, lumbar region without mention of lumbar back pain or lower extremity pain: Secondary | ICD-10-CM

## 2023-11-09 NOTE — Telephone Encounter (Signed)
 Requested medications are due for refill today.  yes  Requested medications are on the active medications list.  yes  Last refill. varied  Future visit scheduled.   no  Notes to clinic.  Labs are expired for statin. Methocarbamol  is not delegated.    Requested Prescriptions  Pending Prescriptions Disp Refills   atorvastatin  (LIPITOR) 20 MG tablet [Pharmacy Med Name: ATORVASTATIN  CALCIUM  20 MG ORAL TABLET] 90 tablet 1    Sig: TAKE ONE TABLET BY MOUTH ONCE DAILY FOR CHOLESTEROL     Cardiovascular:  Antilipid - Statins Failed - 11/09/2023  8:39 AM      Failed - Lipid Panel in normal range within the last 12 months    Cholesterol, Total  Date Value Ref Range Status  10/28/2022 213 (H) 100 - 199 mg/dL Final   LDL Chol Calc (NIH)  Date Value Ref Range Status  10/28/2022 90 0 - 99 mg/dL Final   HDL  Date Value Ref Range Status  10/28/2022 97 >39 mg/dL Final   Triglycerides  Date Value Ref Range Status  10/28/2022 158 (H) 0 - 149 mg/dL Final         Passed - Patient is not pregnant      Passed - Valid encounter within last 12 months    Recent Outpatient Visits           1 month ago Pneumococcal vaccination declined   [place] [facility] Marius Siemens, NP   2 months ago Subacute cough   [place] [facility] Marius Siemens, NP   5 months ago Rash and nonspecific skin eruption   [place] [facility] Marius Siemens, NP   6 months ago Anxiety associated with depression   [place] [facility] Marius Siemens, NP   7 months ago Encounter for immunization   [place] [facility] Marius Siemens, NP               methocarbamol  (ROBAXIN ) 500 MG tablet [Pharmacy Med Name: METHOCARBAMOL  500 MG ORAL TABLET] 120 tablet 1    Sig: TAKE 1 TABLET (500 MG TOTAL) BY MOUTH EVERY 8 (EIGHT) HOURS AS NEEDED FOR MUSCLE SPASMS.     Not Delegated - Analgesics:  Muscle  Relaxants Failed - 11/09/2023  8:39 AM      Failed - This refill cannot be delegated      Passed - Valid encounter within last 6 months    Recent Outpatient Visits           1 month ago Pneumococcal vaccination declined   [place] [facility] Marius Siemens, NP   2 months ago Subacute cough   [place] [facility] Marius Siemens, NP   5 months ago Rash and nonspecific skin eruption   [place] [facility] Marius Siemens, NP   6 months ago Anxiety associated with depression   [place] [facility] Marius Siemens, NP   7 months ago Encounter for immunization   [place] [facility] Marius Siemens, NP              Signed Prescriptions Disp Refills   amLODipine  (NORVASC ) 5 MG tablet 90 tablet 0    Sig: TAKE 1 TABLET (5 MG TOTAL) BY MOUTH DAILY.     Cardiovascular: Calcium  Channel Blockers 2 Passed - 11/09/2023  8:39 AM      Passed - Last BP in  normal range    BP Readings from Last 1 Encounters:  09/25/23 118/78         Passed - Last Heart Rate in normal range    Pulse Readings from Last 1 Encounters:  09/25/23 88         Passed - Valid encounter within last 6 months    Recent Outpatient Visits           1 month ago Pneumococcal vaccination declined   [place] [facility] Marius Siemens, NP   2 months ago Subacute cough   [place] [facility] Marius Siemens, NP   5 months ago Rash and nonspecific skin eruption   [place] [facility] Marius Siemens, NP   6 months ago Anxiety associated with depression   [place] [facility] Marius Siemens, NP   7 months ago Encounter for immunization   [place] [facility] Marius Siemens, NP              Refused Prescriptions Disp Refills   DULoxetine  (CYMBALTA ) 20 MG capsule [Pharmacy Med Name:  DULOXETINE  HCL 20 MG ORAL CAPSULE DELAYED RELEASE PARTICLES] 90 capsule 1    Sig: TAKE 1 CAPSULE (20 MG TOTAL) BY MOUTH DAILY.     Psychiatry: Antidepressants - SNRI - duloxetine  Passed - 11/09/2023  8:39 AM      Passed - Cr in normal range and within 360 days    Creat  Date Value Ref Range Status  04/25/2016 0.72 0.50 - 1.05 mg/dL Final    Comment:      For patients > or = 60 years of age: The upper reference limit for Creatinine is approximately 13% higher for people identified as African-American.      Creatinine, Ser  Date Value Ref Range Status  03/07/2023 0.61 0.44 - 1.00 mg/dL Final         Passed - eGFR is 30 or above and within 360 days    GFR, Est African American  Date Value Ref Range Status  04/25/2016 >89 >=60 mL/min Final   GFR calc Af Amer  Date Value Ref Range Status  08/19/2017 >60 >60 mL/min Final    Comment:    (NOTE) The eGFR has been calculated using the CKD EPI equation. This calculation has not been validated in all clinical situations. eGFR's persistently <60 mL/min signify possible Chronic Kidney Disease.    GFR, Est Non African American  Date Value Ref Range Status  04/25/2016 >89 >=60 mL/min Final   GFR, Estimated  Date Value Ref Range Status  03/07/2023 >60 >60 mL/min Final    Comment:    (NOTE) Calculated using the CKD-EPI Creatinine Equation (2021)    eGFR  Date Value Ref Range Status  10/28/2022 103 >59 mL/min/1.73 Final         Passed - Completed PHQ-2 or PHQ-9 in the last 360 days      Passed - Last BP in normal range    BP Readings from Last 1 Encounters:  09/25/23 118/78         Passed - Valid encounter within last 6 months    Recent Outpatient Visits           1 month ago Pneumococcal vaccination declined   [place] [facility] Marius Siemens, NP   2 months ago Subacute cough   [place] [facility] Marius Siemens, NP   5 months ago  Rash and nonspecific skin eruption    [place] [facility] Marius Siemens, NP   6 months ago Anxiety associated with depression   [place] [facility] Marius Siemens, NP   7 months ago Encounter for immunization   [facility] Marius Siemens, NP

## 2023-11-09 NOTE — Telephone Encounter (Signed)
 Requested Prescriptions  Pending Prescriptions Disp Refills   DULoxetine  (CYMBALTA ) 20 MG capsule [Pharmacy Med Name: DULOXETINE  HCL 20 MG ORAL CAPSULE DELAYED RELEASE PARTICLES] 90 capsule 1    Sig: TAKE 1 CAPSULE (20 MG TOTAL) BY MOUTH DAILY.     Psychiatry: Antidepressants - SNRI - duloxetine  Passed - 11/09/2023  8:38 AM      Passed - Cr in normal range and within 360 days    Creat  Date Value Ref Range Status  04/25/2016 0.72 0.50 - 1.05 mg/dL Final    Comment:      For patients > or = 60 years of age: The upper reference limit for Creatinine is approximately 13% higher for people identified as African-American.      Creatinine, Ser  Date Value Ref Range Status  03/07/2023 0.61 0.44 - 1.00 mg/dL Final         Passed - eGFR is 30 or above and within 360 days    GFR, Est African American  Date Value Ref Range Status  04/25/2016 >89 >=60 mL/min Final   GFR calc Af Amer  Date Value Ref Range Status  08/19/2017 >60 >60 mL/min Final    Comment:    (NOTE) The eGFR has been calculated using the CKD EPI equation. This calculation has not been validated in all clinical situations. eGFR's persistently <60 mL/min signify possible Chronic Kidney Disease.    GFR, Est Non African American  Date Value Ref Range Status  04/25/2016 >89 >=60 mL/min Final   GFR, Estimated  Date Value Ref Range Status  03/07/2023 >60 >60 mL/min Final    Comment:    (NOTE) Calculated using the CKD-EPI Creatinine Equation (2021)    eGFR  Date Value Ref Range Status  10/28/2022 103 >59 mL/min/1.73 Final         Passed - Completed PHQ-2 or PHQ-9 in the last 360 days      Passed - Last BP in normal range    BP Readings from Last 1 Encounters:  09/25/23 118/78         Passed - Valid encounter within last 6 months    Recent Outpatient Visits           1 month ago Pneumococcal vaccination declined   [place] [facility] Marius Siemens, NP   2 months ago Subacute  cough   [place] [facility] Marius Siemens, NP   5 months ago Rash and nonspecific skin eruption   [place] [facility] Marius Siemens, NP   6 months ago Anxiety associated with depression   [place] [facility] Marius Siemens, NP   7 months ago Encounter for immunization   [place] [facility] Marius Siemens, NP               atorvastatin  (LIPITOR) 20 MG tablet [Pharmacy Med Name: ATORVASTATIN  CALCIUM  20 MG ORAL TABLET] 90 tablet 1    Sig: TAKE ONE TABLET BY MOUTH ONCE DAILY FOR CHOLESTEROL     Cardiovascular:  Antilipid - Statins Failed - 11/09/2023  8:38 AM      Failed - Lipid Panel in normal range within the last 12 months    Cholesterol, Total  Date Value Ref Range Status  10/28/2022 213 (H) 100 - 199 mg/dL Final   LDL Chol Calc (NIH)  Date Value Ref Range Status  10/28/2022 90 0 - 99 mg/dL Final   HDL  Date Value Ref Range  Status  10/28/2022 97 >39 mg/dL Final   Triglycerides  Date Value Ref Range Status  10/28/2022 158 (H) 0 - 149 mg/dL Final         Passed - Patient is not pregnant      Passed - Valid encounter within last 12 months    Recent Outpatient Visits           1 month ago Pneumococcal vaccination declined   [place] [facility] Marius Siemens, NP   2 months ago Subacute cough   [place] [facility] Marius Siemens, NP   5 months ago Rash and nonspecific skin eruption   [place] [facility] Marius Siemens, NP   6 months ago Anxiety associated with depression   [place] [facility] Marius Siemens, NP   7 months ago Encounter for immunization   [place] [facility] Marius Siemens, NP               methocarbamol  (ROBAXIN ) 500 MG tablet [Pharmacy Med Name: METHOCARBAMOL  500 MG ORAL TABLET] 120 tablet 1    Sig: TAKE 1 TABLET (500  MG TOTAL) BY MOUTH EVERY 8 (EIGHT) HOURS AS NEEDED FOR MUSCLE SPASMS.     Not Delegated - Analgesics:  Muscle Relaxants Failed - 11/09/2023  8:38 AM      Failed - This refill cannot be delegated      Passed - Valid encounter within last 6 months    Recent Outpatient Visits           1 month ago Pneumococcal vaccination declined   [place] [facility] Marius Siemens, NP   2 months ago Subacute cough   [place] [facility] Marius Siemens, NP   5 months ago Rash and nonspecific skin eruption   [place] [facility] Marius Siemens, NP   6 months ago Anxiety associated with depression   [place] [facility] Marius Siemens, NP   7 months ago Encounter for immunization   [place] [facility] Marius Siemens, NP               amLODipine  (NORVASC ) 5 MG tablet [Pharmacy Med Name: AMLODIPINE  BESYLATE 5 MG ORAL TABLET] 90 tablet 0    Sig: TAKE 1 TABLET (5 MG TOTAL) BY MOUTH DAILY.     Cardiovascular: Calcium  Channel Blockers 2 Passed - 11/09/2023  8:38 AM      Passed - Last BP in normal range    BP Readings from Last 1 Encounters:  09/25/23 118/78         Passed - Last Heart Rate in normal range    Pulse Readings from Last 1 Encounters:  09/25/23 88         Passed - Valid encounter within last 6 months    Recent Outpatient Visits           1 month ago Pneumococcal vaccination declined   [place] [facility] Marius Siemens, NP   2 months ago Subacute cough   [place] [facility] Marius Siemens, NP   5 months ago Rash and nonspecific skin eruption   [place] [facility] Marius Siemens, NP   6 months ago Anxiety associated with depression   [place] [facility] Marius Siemens, NP   7 months ago Encounter for immunization   [facility]  Marius Siemens, NP

## 2023-11-21 ENCOUNTER — Emergency Department (HOSPITAL_COMMUNITY)

## 2023-11-21 ENCOUNTER — Emergency Department (HOSPITAL_COMMUNITY)
Admission: EM | Admit: 2023-11-21 | Discharge: 2023-11-21 | Disposition: A | Discharge status: Home / Self Care | Attending: Emergency Medicine | Admitting: Emergency Medicine

## 2023-11-21 ENCOUNTER — Other Ambulatory Visit: Payer: Self-pay

## 2023-11-21 DIAGNOSIS — Z79899 Other long term (current) drug therapy: Secondary | ICD-10-CM | POA: Diagnosis not present

## 2023-11-21 DIAGNOSIS — J45909 Unspecified asthma, uncomplicated: Secondary | ICD-10-CM | POA: Diagnosis not present

## 2023-11-21 DIAGNOSIS — Z7982 Long term (current) use of aspirin: Secondary | ICD-10-CM | POA: Insufficient documentation

## 2023-11-21 DIAGNOSIS — R059 Cough, unspecified: Secondary | ICD-10-CM | POA: Diagnosis present

## 2023-11-21 DIAGNOSIS — I10 Essential (primary) hypertension: Secondary | ICD-10-CM | POA: Insufficient documentation

## 2023-11-21 LAB — COMPREHENSIVE METABOLIC PANEL WITH GFR
ALT: 30 U/L (ref 0–44)
AST: 36 U/L (ref 15–41)
Albumin: 3.5 g/dL (ref 3.5–5.0)
Alkaline Phosphatase: 147 U/L — ABNORMAL HIGH (ref 38–126)
Anion gap: 13 (ref 5–15)
BUN: 13 mg/dL (ref 6–20)
CO2: 23 mmol/L (ref 22–32)
Calcium: 9.4 mg/dL (ref 8.9–10.3)
Chloride: 104 mmol/L (ref 98–111)
Creatinine, Ser: 0.72 mg/dL (ref 0.44–1.00)
GFR, Estimated: >60 mL/min (ref >60)
Glucose, Bld: 108 mg/dL — ABNORMAL HIGH (ref 70–99)
Potassium: 3.8 mmol/L (ref 3.5–5.1)
Sodium: 140 mmol/L (ref 135–145)
Total Bilirubin: 0.8 mg/dL (ref 0.0–1.2)
Total Protein: 7.6 g/dL (ref 6.5–8.1)

## 2023-11-21 LAB — URINALYSIS, W/ REFLEX TO CULTURE (INFECTION SUSPECTED)
Bilirubin Urine: NEGATIVE
Glucose, UA: NEGATIVE mg/dL
Hgb urine dipstick: NEGATIVE
Ketones, ur: NEGATIVE mg/dL
Leukocytes,Ua: NEGATIVE
Nitrite: NEGATIVE
Protein, ur: NEGATIVE mg/dL
Specific Gravity, Urine: 1.016 (ref 1.005–1.030)
pH: 5 (ref 5.0–8.0)

## 2023-11-21 LAB — CBC WITH DIFFERENTIAL/PLATELET
Abs Immature Granulocytes: 0.05 10*3/uL (ref 0.00–0.07)
Basophils Absolute: 0 10*3/uL (ref 0.0–0.1)
Basophils Relative: 1 %
Eosinophils Absolute: 0.2 10*3/uL (ref 0.0–0.5)
Eosinophils Relative: 4 %
HCT: 40 % (ref 36.0–46.0)
Hemoglobin: 12.8 g/dL (ref 12.0–15.0)
Immature Granulocytes: 1 %
Lymphocytes Relative: 34 %
Lymphs Abs: 1.7 10*3/uL (ref 0.7–4.0)
MCH: 28.4 pg (ref 26.0–34.0)
MCHC: 32 g/dL (ref 30.0–36.0)
MCV: 88.7 fL (ref 80.0–100.0)
Monocytes Absolute: 0.6 10*3/uL (ref 0.1–1.0)
Monocytes Relative: 12 %
Neutro Abs: 2.4 10*3/uL (ref 1.7–7.7)
Neutrophils Relative %: 48 %
Platelets: 206 10*3/uL (ref 150–400)
RBC: 4.51 MIL/uL (ref 3.87–5.11)
RDW: 15.8 % — ABNORMAL HIGH (ref 11.5–15.5)
WBC: 4.9 10*3/uL (ref 4.0–10.5)
nRBC: 0.4 % — ABNORMAL HIGH (ref 0.0–0.2)

## 2023-11-21 LAB — PROTIME-INR
INR: 1.1 (ref 0.8–1.2)
Prothrombin Time: 14 s (ref 11.4–15.2)

## 2023-11-21 LAB — BRAIN NATRIURETIC PEPTIDE: B Natriuretic Peptide: 15.4 pg/mL (ref 0.0–100.0)

## 2023-11-21 LAB — TROPONIN I (HIGH SENSITIVITY): Troponin I (High Sensitivity): 5 ng/L (ref <18)

## 2023-11-21 LAB — RESP PANEL BY RT-PCR (RSV, FLU A&B, COVID)  RVPGX2
Influenza A by PCR: NEGATIVE
Influenza B by PCR: NEGATIVE
Resp Syncytial Virus by PCR: NEGATIVE
SARS Coronavirus 2 by RT PCR: NEGATIVE

## 2023-11-21 LAB — I-STAT CG4 LACTIC ACID, ED: Lactic Acid, Venous: 1.6 mmol/L (ref 0.5–1.9)

## 2023-11-21 LAB — CBG MONITORING, ED: Glucose-Capillary: 115 mg/dL — ABNORMAL HIGH (ref 70–99)

## 2023-11-21 MED ORDER — IPRATROPIUM-ALBUTEROL 0.5-2.5 (3) MG/3ML IN SOLN
3.0000 mL | Freq: Once | RESPIRATORY_TRACT | Status: AC
  Administered 2023-11-21: 3 mL via RESPIRATORY_TRACT
  Filled 2023-11-21: qty 3

## 2023-11-21 MED ORDER — BUDESONIDE-FORMOTEROL FUMARATE 160-4.5 MCG/ACT IN AERO: 2.0000 | INHALATION_SPRAY | Freq: Two times a day (BID) | RESPIRATORY_TRACT | 1 refills | Status: AC

## 2023-11-21 MED ORDER — PREDNISONE 20 MG PO TABS
60.0000 mg | ORAL_TABLET | Freq: Once | ORAL | Status: AC
  Administered 2023-11-21: 60 mg via ORAL
  Filled 2023-11-21: qty 3

## 2023-11-21 MED ORDER — PREDNISONE 20 MG PO TABS
40.0000 mg | ORAL_TABLET | Freq: Every day | ORAL | 0 refills | Status: AC
Start: 2023-11-21 — End: ?

## 2023-11-21 MED ORDER — OXYCODONE HCL 5 MG PO TABS
15.0000 mg | ORAL_TABLET | Freq: Once | ORAL | Status: AC
  Administered 2023-11-21: 15 mg via ORAL
  Filled 2023-11-21: qty 3

## 2023-11-21 NOTE — ED Provider Triage Note (Signed)
 Emergency Medicine Provider Triage Evaluation Note  Heather Perry , a 58 y.o. female  was evaluated in triage.  Pt complains of cough for 1 month, unsure if it is productive.  She thinks she had a fever when it started but none recent.  She does feel short of breath with minimal exertion.  She also just breaks out in a sweat from time to time.  No associated chest discomfort.  She feels like her legs have been just a tiny bit swollen..  Review of Systems  Positive: Cough, shortness of breath, diaphoresis Negative: Ongoing fever  Physical Exam  BP (!) 146/93 (BP Location: Right Arm)   Pulse (!) 118   Temp 98.4 F (36.9 C)   Resp 18   Ht 5\' 2"  (1.575 m)   Wt 92.5 kg   SpO2 97%   BMI 37.31 kg/m  Gen:   Awake, no distress diaphoretic Resp:  Normal effort   MSK:   Moves extremities without difficulty   Other:     Medical Decision Making  Medically screening exam initiated at 8:31 AM.  Appropriate orders placed.  Monte Bronder was informed that the remainder of the evaluation will be completed by another provider, this initial triage assessment does not replace that evaluation, and the importance of remaining in the ED until their evaluation is complete.      Hershel Los, MD 11/21/23 [phone]

## 2023-11-21 NOTE — ED Triage Notes (Addendum)
 Pt. Stated, Heather Perry had a cold and cough with sweat and cold for a month now.  Its always worse at night and have to sit up. I just thought about and episode, I had some chest pain a couple times last week or a couple weeks back.

## 2023-11-21 NOTE — ED Provider Notes (Signed)
 [place] EMERGENCY DEPARTMENT AT [facility] Provider Note   CSN: [id number] Arrival date & time: 11/21/23  2130     History  Chief Complaint  Patient presents with   Cough   Nasal Congestion   Chills   Shortness of Breath   Chest Pain    Heather Perry is a 58 y.o. female with PMHx asthma, OA, GERD, HLD, HTN, seizures who presents to ED concerned for coughing and SOB x1 month. Patient stating that coughing is getting worse at night. Patient also endorsed 2 epsidoes of chest pain over the past 1 month that she states was d/t the coughing. Patient has not had any chest pain in the past 2 weeks. Patient stating that her Symbicort  inhaler usually works but she ran out.     Cough Associated symptoms: chest pain and shortness of breath   Shortness of Breath Associated symptoms: chest pain and cough   Chest Pain Associated symptoms: cough and shortness of breath        Home Medications Prior to Admission medications   Medication Sig Start Date End Date Taking? Authorizing Provider  amLODipine  (NORVASC ) 5 MG tablet TAKE 1 TABLET (5 MG TOTAL) BY MOUTH DAILY. 11/09/23  Yes Marius Siemens, NP  aspirin  EC 81 MG tablet Take 81 mg by mouth daily.   Yes [provider]  atorvastatin  (LIPITOR) 20 MG tablet TAKE ONE TABLET BY MOUTH ONCE DAILY FOR CHOLESTEROL 11/09/23  Yes Marius Siemens, NP  celecoxib  (CELEBREX ) 100 MG capsule TAKE 1 CAPSULE (100 MG TOTAL) BY MOUTH 2 (TWO) TIMES DAILY. 09/03/23  Yes Marius Siemens, NP  cetirizine  (ZYRTEC ) 10 MG tablet TAKE ONE TABLET BY MOUTH ONCE DAILY FOR ALLERGIES 12/21/22  Yes Marius Siemens, NP  DULoxetine  (CYMBALTA ) 30 MG capsule Take 1 capsule (30 mg total) by mouth daily. 09/25/23  Yes Marius Siemens, NP  esomeprazole  (NEXIUM ) 40 MG capsule TAKE ONE CAPSULE AS NEEDED FOR INDIGESTION Patient taking differently: Take 40 mg by mouth in the morning and at bedtime. 04/20/23  Yes Marius Siemens, NP  fluticasone   (FLONASE ) 50 MCG/ACT nasal spray PLACE 2 SPRAYS INTO BOTH NOSTRILS DAILY. 06/09/23  Yes Marius Siemens, NP  gabapentin  (NEURONTIN ) 300 MG capsule Take 300 mg by mouth 3 (three) times daily. 11/07/23  Yes [provider]  hydrochlorothiazide  (HYDRODIURIL ) 25 MG tablet Take 1 tablet (25 mg total) by mouth daily. 01/30/23  Yes Marius Siemens, NP  hydroxychloroquine (PLAQUENIL) 200 MG tablet Take 400 mg by mouth daily. 11/07/23 02/05/24 Yes [provider]  leflunomide (ARAVA) 20 MG tablet Take 20 mg by mouth daily.   Yes [provider]  methocarbamol  (ROBAXIN ) 500 MG tablet TAKE 1 TABLET (500 MG TOTAL) BY MOUTH EVERY 8 (EIGHT) HOURS AS NEEDED FOR MUSCLE SPASMS. 11/09/23  Yes Marius Siemens, NP  oxyCODONE  (ROXICODONE ) 15 MG immediate release tablet Take 15 mg by mouth 4 (four) times daily as needed.   Yes [provider]  predniSONE  (DELTASONE ) 20 MG tablet Take 2 tablets (40 mg total) by mouth daily. 11/21/23  Yes Kemo Spruce F, PA-C  RESTASIS 0.05 % ophthalmic emulsion Place 1 drop into both eyes 2 (two) times daily. 11/07/23  Yes [provider]  traZODone  (DESYREL ) 50 MG tablet Take 50 mg by mouth at bedtime as needed for sleep.   Yes [provider]  triamcinolone  cream (KENALOG ) 0.1 % APPLY 1 APPLICATION TOPICALLY 3 (THREE) TIMES DAILY. 07/10/23  Yes Madelyn Schick  P, NP  VENTOLIN  HFA 108 (90 Base) MCG/ACT inhaler INHALE 2 PUFFS BY MOUTH EVERY 6 HOURS AS NEEDED FOR SHORTNESS OF BREATH OR WHEEZING. 10/12/23  Yes Marius Siemens, NP  budesonide -formoterol  (SYMBICORT ) 160-4.5 MCG/ACT inhaler Inhale 2 puffs into the lungs 2 (two) times daily. 11/21/23   Dorisann Garre F, PA-C  sucralfate  (CARAFATE ) 1 GM/10ML suspension TAKE 10 MLS (1 G TOTAL) BY MOUTH WITH BREAKFAST, WITH LUNCH, AND WITH EVENING MEAL. Patient not taking: Reported on 11/21/2023 06/22/23   Marius Siemens, NP      Allergies    Hydrocodone -acetaminophen , Lortab  [hydrocodone -acetaminophen ], Niacin, Penicillin g, and Penicillins    Review of Systems   Review of Systems  Respiratory:  Positive for cough and shortness of breath.   Cardiovascular:  Positive for chest pain.    Physical Exam Updated Vital Signs BP 135/80   Pulse 90   Temp 98 F (36.7 C) (Oral)   Resp 18   Ht 5\' 2"  (1.575 m)   Wt 92.5 kg   SpO2 98%   BMI 37.31 kg/m  Physical Exam Vitals and nursing note reviewed.  Constitutional:      General: She is not in acute distress.    Appearance: She is not ill-appearing or toxic-appearing.  HENT:     Head: Normocephalic and atraumatic.     Mouth/Throat:     Mouth: Mucous membranes are moist.     Pharynx: No oropharyngeal exudate or posterior oropharyngeal erythema.  Eyes:     General: No scleral icterus.       Right eye: No discharge.        Left eye: No discharge.     Conjunctiva/sclera: Conjunctivae normal.  Cardiovascular:     Rate and Rhythm: Normal rate and regular rhythm.     Pulses: Normal pulses.     Heart sounds: Normal heart sounds. No murmur heard. Pulmonary:     Effort: Pulmonary effort is normal. No respiratory distress.     Breath sounds: No wheezing, rhonchi or rales.     Comments: Mildly distant breath sounds.  Abdominal:     General: Bowel sounds are normal.     Palpations: Abdomen is soft.     Tenderness: There is no abdominal tenderness.  Musculoskeletal:     Right lower leg: No edema.     Left lower leg: No edema.  Skin:    General: Skin is warm and dry.     Findings: No rash.  Neurological:     General: No focal deficit present.     Mental Status: She is alert and oriented to person, place, and time. Mental status is at baseline.  Psychiatric:        Mood and Affect: Mood normal.        Behavior: Behavior normal.     ED Results / Procedures / Treatments   Labs (all labs ordered are listed, but only abnormal results are displayed) Labs Reviewed  CBC WITH DIFFERENTIAL/PLATELET - Abnormal;  Notable for the following components:      Result Value   RDW 15.8 (*)    nRBC 0.4 (*)    All other components within normal limits  COMPREHENSIVE METABOLIC PANEL WITH GFR - Abnormal; Notable for the following components:   Glucose, Bld 108 (*)    Alkaline Phosphatase 147 (*)    All other components within normal limits  CBG MONITORING, ED - Abnormal; Notable for the following components:   Glucose-Capillary 115 (*)    All  other components within normal limits  RESP PANEL BY RT-PCR (RSV, FLU A&B, COVID)  RVPGX2  BRAIN NATRIURETIC PEPTIDE  PROTIME-INR  CBC WITH DIFFERENTIAL/PLATELET  URINALYSIS, W/ REFLEX TO CULTURE (INFECTION SUSPECTED)  I-STAT CG4 LACTIC ACID, ED  TROPONIN I (HIGH SENSITIVITY)    EKG EKG Interpretation Date/Time:  Tuesday November 21 2023 08:34:29 EDT Ventricular Rate:  113 PR Interval:  146 QRS Duration:  68 QT Interval:  328 QTC Calculation: 449 R Axis:   24  Text Interpretation: Sinus tachycardia Low voltage QRS Borderline ECG When compared with ECG of 06-Dec-2022 10:47, PREVIOUS ECG IS PRESENT SINCE LAST TRACING HEART RATE HAS INCREASED Confirmed by Hershel Los [phone]) on 11/21/2023 8:35:56 AM  Radiology DG Chest 2 View Result Date: 11/21/2023 CLINICAL DATA:  Shortness of breath EXAM: CHEST - 2 VIEW COMPARISON:  December 06, 2022 FINDINGS: The heart size and mediastinal contours are within normal limits. Both lungs are clear. The visualized skeletal structures are unremarkable. IMPRESSION: No active cardiopulmonary disease. Electronically Signed   By: Fredrich Jefferson M.D.   On: 11/21/2023 09:25    Procedures Procedures    Medications Ordered in ED Medications  ipratropium-albuterol  (DUONEB) 0.5-2.5 (3) MG/3ML nebulizer solution 3 mL (3 mLs Nebulization Given 11/21/23 1334)  ipratropium-albuterol  (DUONEB) 0.5-2.5 (3) MG/3ML nebulizer solution 3 mL (3 mLs Nebulization Given 11/21/23 1334)  predniSONE  (DELTASONE ) tablet 60 mg (60 mg Oral Given 11/21/23 1333)   oxyCODONE  (Oxy IR/ROXICODONE ) immediate release tablet 15 mg (15 mg Oral Given 11/21/23 1458)    ED Course/ Medical Decision Making/ A&P                                 Medical Decision Making Amount and/or Complexity of Data Reviewed Labs: ordered. Radiology: ordered.  Risk Prescription drug management.   This patient presents to the ED for concern of shortness of breath, this involves an extensive number of treatment options, and is a complaint that carries with it a high risk of complications and morbidity.  The differential diagnosis includes Anxiety, Anaphylaxis/Angioedema, Aspirated FB, Arrhythmia, CHF, Asthma, COPD, PNA, COVID/Flu/RSV, STEMI, Tamponade, TPNX, Sepsis   Co morbidities that complicate the patient evaluation  asthma, OA, GERD, HLD, HTN, seizures   Additional history obtained:  Dr. Denece Finger PCP   Problem List / ED Course / Critical interventions / Medication management  Patient presents to ED concern for SOB and cough x 1 month.  Breathing is worse at night.  Denies recent fever.  Denies any other recent infectious symptoms.  Endorsed episode of chest pain 2 weeks ago that she states was due to her coughing.  Patient stating that she is out of her Symbicort  which usually helps with her SOB. Triage provider ordered labs.  BNP within normal limits.  CBC without leukocytosis or anemia.  Troponin within normal limits.  Lactic acid within normal limits.  CMP with mild elevation in ALP at 147.  Respiratory panel negative.  CBG 115.  UA pending. The patient was maintained on a cardiac monitor.  I personally viewed and interpreted the cardiac monitored which showed an underlying rhythm of: Sinus tachycardia I ordered imaging studies including chest xray to assess for process contributing to patient's symptoms. I independently visualized and interpreted imaging which showed no acute cardiopulmonary disease. I agree with the radiologist interpretation Provided patient with  oral prednisone  and 2 rounds of DuoNeb.  Patient stating that she is feeling a lot better and would  like to go home now.  Educated patient that her urine has not resulted yet.  Patient stated that she would prefer to follow-up with her primary care for this urine result tomorrow.  I offered patient further workup/imaging such as CTA chest.  Patient declining stating that she feels a lot better after her DuoNeb and would just like a new prescription for her Symbicort .  I felt this is appropriate since patient is feeling better.  Patient agrees to follow-up with PCP tomorrow.  Prescribed patient 5-day course of prednisone  along with her Symbicort . I have reviewed the patients home medicines and have made adjustments as needed The patient has been appropriately medically screened and/or stabilized in the ED. I have low suspicion for any other emergent medical condition which would require further screening, evaluation or treatment in the ED or require inpatient management. At time of discharge the patient is hemodynamically stable and in no acute distress. I have discussed work-up results and diagnosis with patient and answered all questions. Patient is agreeable with discharge plan. We discussed strict return precautions for returning to the emergency department and they verbalized understanding.     Social Determinants of Health:  none          Final Clinical Impression(s) / ED Diagnoses Final diagnoses:  Moderate asthma without complication, unspecified whether persistent    Rx / DC Orders ED Discharge Orders          Ordered    budesonide -formoterol  (SYMBICORT ) 160-4.5 MCG/ACT inhaler  2 times daily        11/21/23 1455    predniSONE  (DELTASONE ) 20 MG tablet  Daily        11/21/23 1455              Timblin Bureau, PA-C 11/21/23 1520    Mozell Arias, MD 11/21/23 [phone]

## 2023-11-21 NOTE — Discharge Instructions (Addendum)
 It was a pleasure caring for you today.  As discussed, please follow-up with your primary care provider tomorrow morning for your urinalysis results.  Seek emergency care if experiencing any new or worsening symptoms.

## 2023-12-06 ENCOUNTER — Other Ambulatory Visit (INDEPENDENT_AMBULATORY_CARE_PROVIDER_SITE_OTHER): Payer: Self-pay | Admitting: Primary Care

## 2023-12-06 DIAGNOSIS — J45909 Unspecified asthma, uncomplicated: Secondary | ICD-10-CM

## 2023-12-08 NOTE — Telephone Encounter (Signed)
 Requested Prescriptions  Pending Prescriptions Disp Refills   DULoxetine  (CYMBALTA ) 30 MG capsule [Pharmacy Med Name: DULOXETINE  HCL 30 MG ORAL CAPSULE DELAYED RELEASE PARTICLES] 90 capsule 0    Sig: TAKE 1 CAPSULE (30 MG TOTAL) BY MOUTH DAILY. (BEDTIME)     Psychiatry: Antidepressants - SNRI - duloxetine  Passed - 12/08/2023 12:40 PM      Passed - Cr in normal range and within 360 days    Creat  Date Value Ref Range Status  04/25/2016 0.72 0.50 - 1.05 mg/dL Final    Comment:      For patients > or = 60 years of age: The upper reference limit for Creatinine is approximately 13% higher for people identified as African-American.      Creatinine, Ser  Date Value Ref Range Status  11/21/2023 0.72 0.44 - 1.00 mg/dL Final         Passed - eGFR is 30 or above and within 360 days    GFR, Est African American  Date Value Ref Range Status  04/25/2016 >89 >=60 mL/min Final   GFR calc Af Amer  Date Value Ref Range Status  08/19/2017 >60 >60 mL/min Final    Comment:    (NOTE) The eGFR has been calculated using the CKD EPI equation. This calculation has not been validated in all clinical situations. eGFR's persistently <60 mL/min signify possible Chronic Kidney Disease.    GFR, Est Non African American  Date Value Ref Range Status  04/25/2016 >89 >=60 mL/min Final   GFR, Estimated  Date Value Ref Range Status  11/21/2023 >60 >60 mL/min Final    Comment:    (NOTE) Calculated using the CKD-EPI Creatinine Equation (2021)    eGFR  Date Value Ref Range Status  10/28/2022 103 >59 mL/min/1.73 Final         Passed - Completed PHQ-2 or PHQ-9 in the last 360 days      Passed - Last BP in normal range    BP Readings from Last 1 Encounters:  11/21/23 135/80         Passed - Valid encounter within last 6 months    Recent Outpatient Visits           2 months ago Pneumococcal vaccination declined   [place] [facility] Marius Siemens, NP   3 months ago  Subacute cough   [place] [facility] Marius Siemens, NP   6 months ago Rash and nonspecific skin eruption   [place] [facility] Marius Siemens, NP   7 months ago Anxiety associated with depression   [place] [facility] Marius Siemens, NP   8 months ago Encounter for immunization   [place] [facility] Marius Siemens, NP               cetirizine  (ZYRTEC ) 10 MG tablet [Pharmacy Med Name: CETIRIZINE  HCL 10 MG ORAL TABLET] 90 tablet 1    Sig: TAKE ONE TABLET BY MOUTH ONCE DAILY FOR ALLERGIES (AM)     Ear, Nose, and Throat:  Antihistamines 2 Passed - 12/08/2023 12:40 PM      Passed - Cr in normal range and within 360 days    Creat  Date Value Ref Range Status  04/25/2016 0.72 0.50 - 1.05 mg/dL Final    Comment:      For patients > or = 60 years of age: The upper reference limit for Creatinine is approximately 13% higher for people  identified as African-American.      Creatinine, Ser  Date Value Ref Range Status  11/21/2023 0.72 0.44 - 1.00 mg/dL Final         Passed - Valid encounter within last 12 months    Recent Outpatient Visits           2 months ago Pneumococcal vaccination declined   [place] [facility] Marius Siemens, NP   3 months ago Subacute cough   [place] [facility] Marius Siemens, NP   6 months ago Rash and nonspecific skin eruption   [place] [facility] Marius Siemens, NP   7 months ago Anxiety associated with depression   [place] [facility] Marius Siemens, NP   8 months ago Encounter for immunization   [place] [facility] Marius Siemens, NP               VENTOLIN  HFA 108 (90 Base) MCG/ACT inhaler [Pharmacy Med Name: VENTOLIN  HFA 108 (90 BASE) MCG/ACT INHALATION AEROSOL SOLUTION] 18 g 2    Sig: INHALE 2 PUFFS BY MOUTH EVERY 6  HOURS AS NEEDED FOR SHORTNESS OF BREATH OR WHEEZING.     Pulmonology:  Beta Agonists 2 Passed - 12/08/2023 12:40 PM      Passed - Last BP in normal range    BP Readings from Last 1 Encounters:  11/21/23 135/80         Passed - Last Heart Rate in normal range    Pulse Readings from Last 1 Encounters:  11/21/23 90         Passed - Valid encounter within last 12 months    Recent Outpatient Visits           2 months ago Pneumococcal vaccination declined   [place] [facility] Marius Siemens, NP   3 months ago Subacute cough   [place] [facility] Marius Siemens, NP   6 months ago Rash and nonspecific skin eruption   [place] [facility] Marius Siemens, NP   7 months ago Anxiety associated with depression   [place] [facility] Marius Siemens, NP   8 months ago Encounter for immunization   [facility] Marius Siemens, NP

## 2024-01-08 ENCOUNTER — Other Ambulatory Visit (INDEPENDENT_AMBULATORY_CARE_PROVIDER_SITE_OTHER): Payer: Self-pay | Admitting: Primary Care

## 2024-01-08 DIAGNOSIS — K2100 Gastro-esophageal reflux disease with esophagitis, without bleeding: Secondary | ICD-10-CM

## 2024-01-09 NOTE — Telephone Encounter (Signed)
 Will forward to provider

## 2024-02-20 ENCOUNTER — Other Ambulatory Visit (INDEPENDENT_AMBULATORY_CARE_PROVIDER_SITE_OTHER): Payer: Self-pay | Admitting: Primary Care

## 2024-02-20 DIAGNOSIS — I10 Essential (primary) hypertension: Secondary | ICD-10-CM

## 2024-02-21 ENCOUNTER — Other Ambulatory Visit (INDEPENDENT_AMBULATORY_CARE_PROVIDER_SITE_OTHER): Payer: Self-pay | Admitting: Primary Care

## 2024-02-21 DIAGNOSIS — R21 Rash and other nonspecific skin eruption: Secondary | ICD-10-CM

## 2024-02-21 NOTE — Telephone Encounter (Signed)
 Requested by interface surescripts. Protocols met.  Requested Prescriptions  Pending Prescriptions Disp Refills   amLODipine  (NORVASC ) 5 MG tablet [Pharmacy Med Name: AMLODIPINE  BESYLATE 5 MG ORAL TABLET] 90 tablet 0    Sig: TAKE 1 TABLET (5 MG TOTAL) BY MOUTH DAILY. (AM)     Cardiovascular: Calcium  Channel Blockers 2 Passed - 02/21/2024 11:42 AM      Passed - Last BP in normal range    BP Readings from Last 1 Encounters:  11/21/23 135/80         Passed - Last Heart Rate in normal range    Pulse Readings from Last 1 Encounters:  11/21/23 90         Passed - Valid encounter within last 6 months    Recent Outpatient Visits           4 months ago Pneumococcal vaccination declined   [place] [facility] Celestia Rosaline SQUIBB, NP   5 months ago Subacute cough   [place] [facility] Celestia Rosaline SQUIBB, NP   8 months ago Rash and nonspecific skin eruption   [place] [facility] Celestia Rosaline SQUIBB, NP   10 months ago Anxiety associated with depression   [place] [facility] Celestia Rosaline SQUIBB, NP   11 months ago Encounter for immunization   [facility] Celestia Rosaline SQUIBB, NP

## 2024-02-22 NOTE — Telephone Encounter (Signed)
 Requested medication (s) are due for refill today- provider review   Requested medication (s) are on the active medication list -yes  Future visit scheduled -no  Last refill: 07/10/23 454g  Notes to clinic: non delegated Rx  Requested Prescriptions  Pending Prescriptions Disp Refills   triamcinolone  cream (KENALOG ) 0.1 % [Pharmacy Med Name: TRIAMCINOLONE  ACETONIDE 0.1 % EXTERNAL CREAM] 454 g 0    Sig: APPLY 1 APPLICATION TOPICALLY 3 (THREE) TIMES DAILY.     Not Delegated - Dermatology:  Corticosteroids Failed - 02/22/2024 12:06 PM      Failed - This refill cannot be delegated      Passed - Valid encounter within last 12 months    Recent Outpatient Visits           5 months ago Pneumococcal vaccination declined   [place] [facility] Celestia Rosaline SQUIBB, NP   5 months ago Subacute cough   [place] [facility] Celestia Rosaline SQUIBB, NP   8 months ago Rash and nonspecific skin eruption   [place] [facility] Celestia Rosaline SQUIBB, NP   10 months ago Anxiety associated with depression   [place] [facility] Celestia Rosaline SQUIBB, NP   11 months ago Encounter for immunization   [place] [facility] Celestia Rosaline SQUIBB, NP                 Requested Prescriptions  Pending Prescriptions Disp Refills   triamcinolone  cream (KENALOG ) 0.1 % [Pharmacy Med Name: TRIAMCINOLONE  ACETONIDE 0.1 % EXTERNAL CREAM] 454 g 0    Sig: APPLY 1 APPLICATION TOPICALLY 3 (THREE) TIMES DAILY.     Not Delegated - Dermatology:  Corticosteroids Failed - 02/22/2024 12:06 PM      Failed - This refill cannot be delegated      Passed - Valid encounter within last 12 months    Recent Outpatient Visits           5 months ago Pneumococcal vaccination declined   [place] [facility] Celestia Rosaline SQUIBB, NP   5 months ago Subacute cough   [place] [facility] Celestia Rosaline SQUIBB,  NP   8 months ago Rash and nonspecific skin eruption   [place] [facility] Celestia Rosaline SQUIBB, NP   10 months ago Anxiety associated with depression    [facility] Celestia Rosaline SQUIBB, NP   11 months ago Encounter for immunization   [facility] Celestia Rosaline SQUIBB, NP

## 2024-03-01 ENCOUNTER — Other Ambulatory Visit (INDEPENDENT_AMBULATORY_CARE_PROVIDER_SITE_OTHER): Payer: Self-pay | Admitting: Primary Care

## 2024-03-01 DIAGNOSIS — M51369 Other intervertebral disc degeneration, lumbar region without mention of lumbar back pain or lower extremity pain: Secondary | ICD-10-CM

## 2024-03-03 ENCOUNTER — Other Ambulatory Visit (HOSPITAL_BASED_OUTPATIENT_CLINIC_OR_DEPARTMENT_OTHER): Payer: Self-pay | Admitting: Internal Medicine

## 2024-03-03 DIAGNOSIS — Z780 Asymptomatic menopausal state: Secondary | ICD-10-CM

## 2024-03-03 DIAGNOSIS — Z1231 Encounter for screening mammogram for malignant neoplasm of breast: Secondary | ICD-10-CM

## 2024-04-01 ENCOUNTER — Other Ambulatory Visit (INDEPENDENT_AMBULATORY_CARE_PROVIDER_SITE_OTHER): Payer: Self-pay | Admitting: Primary Care

## 2024-04-01 DIAGNOSIS — M5442 Lumbago with sciatica, left side: Secondary | ICD-10-CM

## 2024-04-02 NOTE — Telephone Encounter (Signed)
 Will forward to provider

## 2024-04-10 ENCOUNTER — Emergency Department (HOSPITAL_COMMUNITY)
Admission: EM | Admit: 2024-04-10 | Discharge: 2024-04-10 | Discharge status: Against Medical Advice | Attending: Emergency Medicine | Admitting: Emergency Medicine

## 2024-04-10 ENCOUNTER — Emergency Department (HOSPITAL_COMMUNITY)

## 2024-04-10 ENCOUNTER — Encounter (HOSPITAL_COMMUNITY): Payer: Self-pay

## 2024-04-10 DIAGNOSIS — Z7982 Long term (current) use of aspirin: Secondary | ICD-10-CM | POA: Diagnosis not present

## 2024-04-10 DIAGNOSIS — Z79899 Other long term (current) drug therapy: Secondary | ICD-10-CM | POA: Insufficient documentation

## 2024-04-10 DIAGNOSIS — E876 Hypokalemia: Secondary | ICD-10-CM | POA: Diagnosis not present

## 2024-04-10 DIAGNOSIS — R1084 Generalized abdominal pain: Secondary | ICD-10-CM | POA: Insufficient documentation

## 2024-04-10 DIAGNOSIS — E871 Hypo-osmolality and hyponatremia: Secondary | ICD-10-CM | POA: Insufficient documentation

## 2024-04-10 DIAGNOSIS — I10 Essential (primary) hypertension: Secondary | ICD-10-CM | POA: Diagnosis not present

## 2024-04-10 LAB — CBC WITH DIFFERENTIAL/PLATELET
Abs Immature Granulocytes: 0.03 K/uL (ref 0.00–0.07)
Basophils Absolute: 0 K/uL (ref 0.0–0.1)
Basophils Relative: 0 %
Eosinophils Absolute: 0.1 K/uL (ref 0.0–0.5)
Eosinophils Relative: 2 %
HCT: 35.8 % — ABNORMAL LOW (ref 36.0–46.0)
Hemoglobin: 11.2 g/dL — ABNORMAL LOW (ref 12.0–15.0)
Immature Granulocytes: 1 %
Lymphocytes Relative: 21 %
Lymphs Abs: 1.1 K/uL (ref 0.7–4.0)
MCH: 28.6 pg (ref 26.0–34.0)
MCHC: 31.3 g/dL (ref 30.0–36.0)
MCV: 91.6 fL (ref 80.0–100.0)
Monocytes Absolute: 0.4 K/uL (ref 0.1–1.0)
Monocytes Relative: 8 %
Neutro Abs: 3.6 K/uL (ref 1.7–7.7)
Neutrophils Relative %: 68 %
Platelets: 160 K/uL (ref 150–400)
RBC: 3.91 MIL/uL (ref 3.87–5.11)
RDW: 16.1 % — ABNORMAL HIGH (ref 11.5–15.5)
WBC: 5.2 K/uL (ref 4.0–10.5)
nRBC: 0 % (ref 0.0–0.2)

## 2024-04-10 LAB — URINALYSIS, ROUTINE W REFLEX MICROSCOPIC
Bilirubin Urine: NEGATIVE
Glucose, UA: NEGATIVE mg/dL
Hgb urine dipstick: NEGATIVE
Ketones, ur: NEGATIVE mg/dL
Leukocytes,Ua: NEGATIVE
Nitrite: NEGATIVE
Protein, ur: NEGATIVE mg/dL
Specific Gravity, Urine: 1.016 (ref 1.005–1.030)
pH: 5 (ref 5.0–8.0)

## 2024-04-10 LAB — I-STAT CHEM 8, ED
BUN: 11 mg/dL (ref 6–20)
Calcium, Ion: 0.94 mmol/L — ABNORMAL LOW (ref 1.15–1.40)
Chloride: 102 mmol/L (ref 98–111)
Creatinine, Ser: 0.7 mg/dL (ref 0.44–1.00)
Glucose, Bld: 91 mg/dL (ref 70–99)
HCT: 33 % — ABNORMAL LOW (ref 36.0–46.0)
Hemoglobin: 11.2 g/dL — ABNORMAL LOW (ref 12.0–15.0)
Potassium: 3.2 mmol/L — ABNORMAL LOW (ref 3.5–5.1)
Sodium: 140 mmol/L (ref 135–145)
TCO2: 25 mmol/L (ref 22–32)

## 2024-04-10 LAB — COMPREHENSIVE METABOLIC PANEL WITH GFR
ALT: 43 U/L (ref 0–44)
AST: 81 U/L — ABNORMAL HIGH (ref 15–41)
Albumin: 3.2 g/dL — ABNORMAL LOW (ref 3.5–5.0)
Alkaline Phosphatase: 164 U/L — ABNORMAL HIGH (ref 38–126)
Anion gap: 5 (ref 5–15)
BUN: 10 mg/dL (ref 6–20)
CO2: 26 mmol/L (ref 22–32)
Calcium: 8.1 mg/dL — ABNORMAL LOW (ref 8.9–10.3)
Chloride: 103 mmol/L (ref 98–111)
Creatinine, Ser: 0.65 mg/dL (ref 0.44–1.00)
GFR, Estimated: >60 mL/min (ref >60)
Glucose, Bld: 94 mg/dL (ref 70–99)
Potassium: 3.2 mmol/L — ABNORMAL LOW (ref 3.5–5.1)
Sodium: 134 mmol/L — ABNORMAL LOW (ref 135–145)
Total Bilirubin: 1.1 mg/dL (ref 0.0–1.2)
Total Protein: 6.5 g/dL (ref 6.5–8.1)

## 2024-04-10 LAB — LIPASE, BLOOD: Lipase: 32 U/L (ref 11–51)

## 2024-04-10 MED ORDER — DICYCLOMINE HCL 10 MG PO CAPS
10.0000 mg | ORAL_CAPSULE | Freq: Once | ORAL | Status: DC
  Filled 2024-04-10: qty 1

## 2024-04-10 MED ORDER — DICYCLOMINE HCL 20 MG PO TABS: 20.0000 mg | ORAL_TABLET | Freq: Two times a day (BID) | ORAL | 0 refills | Status: DC

## 2024-04-10 MED ORDER — IOHEXOL 350 MG/ML SOLN
75.0000 mL | Freq: Once | INTRAVENOUS | Status: AC | PRN
  Administered 2024-04-10: 75 mL via INTRAVENOUS

## 2024-04-10 MED ORDER — OXYCODONE HCL 5 MG PO TABS
15.0000 mg | ORAL_TABLET | Freq: Once | ORAL | Status: DC
Start: 2024-04-10 — End: 2024-04-10
  Filled 2024-04-10: qty 3

## 2024-04-10 NOTE — ED Triage Notes (Signed)
 Pt c/o cramping abdominal pain that hurts bad and makes it hard to breathe for past 2 days.

## 2024-04-10 NOTE — ED Provider Triage Note (Signed)
 Emergency Medicine Provider Triage Evaluation Note  Heather Perry , a 58 y.o. female  was evaluated in triage.  Pt complains of lower abdominal pain that started 2 days ago.  Has history of Sjogren's and states that she started steroids last week.  She stopped taking these after she noticed the abdominal pain.  No previous abdominal surgeries according to her.  Denies nausea, vomiting, diarrhea, or constipation.  Denies dysuria  Review of Systems  Positive: As above Negative: As above  Physical Exam  BP 129/85   Pulse (!) 104   Temp 97.8 F (36.6 C) (Oral)   Resp 18   SpO2 93%  Gen:   Awake, no distress   Resp:  Normal effort  MSK:   Moves extremities without difficulty  Other:    Medical Decision Making  Medically screening exam initiated at 12:16 PM.  Appropriate orders placed.  Heather Perry was informed that the remainder of the evaluation will be completed by another provider, this initial triage assessment does not replace that evaluation, and the importance of remaining in the ED until their evaluation is complete.    Hildegard Loge, PA-C 04/10/24 1217

## 2024-04-10 NOTE — ED Notes (Signed)
 Pt left ama

## 2024-04-10 NOTE — Discharge Instructions (Addendum)

## 2024-04-10 NOTE — ED Notes (Signed)
 While printing pts papers and grabbing pts meds, RN found room empty. According to tech pt AMA'd shortly earlier.  Lily pa Programme researcher, broadcasting/film/video pa alerted via conversation and secure chat respectively.

## 2024-04-10 NOTE — ED Provider Notes (Signed)
 [place] EMERGENCY DEPARTMENT AT [facility] Provider Note   CSN: [id number] Arrival date & time: 04/10/24  1152     Patient presents with: Abdominal Pain   Heather Perry is a 58 y.o. female PMH of HLD, HTN, Sjogrens Disease, and cocaine abuse who presents today with 2 days of lower bilateral abdominal pain and distended abdomen. States it is worse after eating and when she is on her feet or walking. She has no pain when sitting or resting. She denies SOB, chest pain, vomiting, diarrhea, melena, back pain, urinary symptoms, and numbness/tingling in her legs. She was started on oral prednisone  one week ago for her Sjogrens and discontinued yesterday. She has had normal bowel movements since but states she felt relief of her pain after passing gas today. She has a past surgical history of Tubal ligation. Patient denies sob.    Abdominal Pain      Prior to Admission medications   Medication Sig Start Date End Date Taking? Authorizing Provider  amLODipine  (NORVASC ) 5 MG tablet TAKE 1 TABLET (5 MG TOTAL) BY MOUTH DAILY. (AM) 02/21/24   Celestia Rosaline SQUIBB, NP  aspirin  EC 81 MG tablet Take 81 mg by mouth daily.    [provider]  atorvastatin  (LIPITOR) 20 MG tablet TAKE ONE TABLET BY MOUTH ONCE DAILY FOR CHOLESTEROL 11/09/23   Celestia Rosaline SQUIBB, NP  budesonide -formoterol  (SYMBICORT ) 160-4.5 MCG/ACT inhaler Inhale 2 puffs into the lungs 2 (two) times daily. 11/21/23   Hoy Nidia FALCON, PA-C  celecoxib  (CELEBREX ) 100 MG capsule TAKE 1 CAPSULE BY MOUTH 2 (TWO) TIMES DAILY. (AM+PM) 04/05/24   Celestia Rosaline SQUIBB, NP  cetirizine  (ZYRTEC ) 10 MG tablet TAKE ONE TABLET BY MOUTH ONCE DAILY FOR ALLERGIES (AM) 12/08/23   Celestia Rosaline SQUIBB, NP  DULoxetine  (CYMBALTA ) 30 MG capsule TAKE 1 CAPSULE (30 MG TOTAL) BY MOUTH DAILY. (BEDTIME) 04/05/24   Celestia Rosaline SQUIBB, NP  esomeprazole  (NEXIUM ) 40 MG capsule TAKE ONE CAPSULE AS NEEDED FOR INDIGESTION Patient taking differently: Take  40 mg by mouth in the morning and at bedtime. 04/20/23   Celestia Rosaline SQUIBB, NP  fluticasone  (FLONASE ) 50 MCG/ACT nasal spray PLACE 2 SPRAYS INTO BOTH NOSTRILS DAILY. 06/09/23   Celestia Rosaline SQUIBB, NP  gabapentin  (NEURONTIN ) 300 MG capsule Take 300 mg by mouth 3 (three) times daily. 11/07/23   [provider]  hydrochlorothiazide  (HYDRODIURIL ) 25 MG tablet TAKE 1 TABLET (25 MG TOTAL) BY MOUTH DAILY. (AM) 01/14/24   Celestia Rosaline SQUIBB, NP  leflunomide (ARAVA) 20 MG tablet Take 20 mg by mouth daily.    [provider]  methocarbamol  (ROBAXIN ) 500 MG tablet TAKE 1 TABLET (500 MG TOTAL) BY MOUTH EVERY 8 (EIGHT) HOURS AS NEEDED FOR MUSCLE SPASMS. 03/01/24   Celestia Rosaline SQUIBB, NP  oxyCODONE  (ROXICODONE ) 15 MG immediate release tablet Take 15 mg by mouth 4 (four) times daily as needed.    [provider]  predniSONE  (DELTASONE ) 20 MG tablet Take 2 tablets (40 mg total) by mouth daily. 11/21/23   Hoy Nidia FALCON, PA-C  RESTASIS 0.05 % ophthalmic emulsion Place 1 drop into both eyes 2 (two) times daily. 11/07/23   [provider]  sucralfate  (CARAFATE ) 1 GM/10ML suspension TAKE 10 MLS (1 G TOTAL) BY MOUTH WITH BREAKFAST, WITH LUNCH, AND WITH EVENING MEAL. Patient not taking: Reported on 11/21/2023 06/22/23   Celestia Rosaline SQUIBB, NP  traZODone  (DESYREL ) 50 MG tablet Take 50 mg by mouth at bedtime as needed for sleep.  [provider]  triamcinolone  cream (KENALOG ) 0.1 % APPLY 1 APPLICATION TOPICALLY 3 (THREE) TIMES DAILY. 02/23/24   Celestia Rosaline SQUIBB, NP  VENTOLIN  HFA 108 (90 Base) MCG/ACT inhaler INHALE 2 PUFFS BY MOUTH EVERY 6 HOURS AS NEEDED FOR SHORTNESS OF BREATH OR WHEEZING. 12/08/23   Celestia Rosaline SQUIBB, NP    Allergies: Hydrocodone -acetaminophen , Lortab [hydrocodone -acetaminophen ], Niacin, Penicillin g, and Penicillins    Review of Systems  Gastrointestinal:  Positive for abdominal pain.    Updated Vital Signs BP (!) 125/93   Pulse 88   Temp 97.8  F (36.6 C) (Oral)   Resp 18   SpO2 94%   Physical Exam Vitals and nursing note reviewed.  Constitutional:      General: She is not in acute distress.    Appearance: She is well-developed. She is not diaphoretic.  HENT:     Head: Normocephalic and atraumatic.     Right Ear: External ear normal.     Left Ear: External ear normal.     Nose: Nose normal.     Mouth/Throat:     Mouth: Mucous membranes are moist.  Eyes:     General: No scleral icterus.    Conjunctiva/sclera: Conjunctivae normal.  Cardiovascular:     Rate and Rhythm: Normal rate and regular rhythm.     Heart sounds: Normal heart sounds. No murmur heard.    No friction rub. No gallop.  Pulmonary:     Effort: Pulmonary effort is normal. No respiratory distress.     Breath sounds: Normal breath sounds.  Abdominal:     General: Bowel sounds are normal. There is no distension.     Palpations: Abdomen is soft. There is no mass.     Tenderness: There is no abdominal tenderness. There is no guarding.     Comments: Old midline surgical scar from the umbilicus down to the pubic bone, multiple old trocar sites well-healed, generalized abdominal tenderness on exam without rebound or guarding.  Musculoskeletal:     Cervical back: Normal range of motion.  Skin:    General: Skin is warm and dry.  Neurological:     Mental Status: She is alert and oriented to person, place, and time.  Psychiatric:        Behavior: Behavior normal.     (all labs ordered are listed, but only abnormal results are displayed) Labs Reviewed  CBC WITH DIFFERENTIAL/PLATELET - Abnormal; Notable for the following components:      Result Value   Hemoglobin 11.2 (*)    HCT 35.8 (*)    RDW 16.1 (*)    All other components within normal limits  COMPREHENSIVE METABOLIC PANEL WITH GFR - Abnormal; Notable for the following components:   Sodium 134 (*)    Potassium 3.2 (*)    Calcium  8.1 (*)    Albumin 3.2 (*)    AST 81 (*)    Alkaline Phosphatase 164  (*)    All other components within normal limits  I-STAT CHEM 8, ED - Abnormal; Notable for the following components:   Potassium 3.2 (*)    Calcium , Ion 0.94 (*)    Hemoglobin 11.2 (*)    HCT 33.0 (*)    All other components within normal limits  LIPASE, BLOOD  URINALYSIS, ROUTINE W REFLEX MICROSCOPIC    EKG: None  Radiology: CT ABDOMEN PELVIS W CONTRAST Result Date: 04/10/2024 CLINICAL DATA:  Abdominal pain. EXAM: CT ABDOMEN AND PELVIS WITH CONTRAST TECHNIQUE: Multidetector CT imaging of the abdomen and  pelvis was performed using the standard protocol following bolus administration of intravenous contrast. RADIATION DOSE REDUCTION: This exam was performed according to the departmental dose-optimization program which includes automated exposure control, adjustment of the mA and/or kV according to patient size and/or use of iterative reconstruction technique. CONTRAST:  75mL OMNIPAQUE  IOHEXOL  350 MG/ML SOLN COMPARISON:  CT dated 02/02/2023. FINDINGS: Lower chest: The visualized lung bases are clear. No intra-abdominal free air or free fluid. Hepatobiliary: The liver is unremarkable. No biliary dilatation. The gallbladder is unremarkable. Pancreas: Unremarkable. No pancreatic ductal dilatation or surrounding inflammatory changes. Spleen: Normal in size without focal abnormality. Adrenals/Urinary Tract: The adrenal glands unremarkable. There is no hydronephrosis on either side. There is symmetric enhancement and excretion of contrast by both kidneys. The visualized ureters and urinary bladder appear unremarkable. Stomach/Bowel: There is a 16 mm diverticulum of the posterior gastric fundus. Mild sigmoid diverticulosis. There is no bowel obstruction or active inflammation. The appendix is normal. Vascular/Lymphatic: The abdominal aorta and IVC unremarkable. No portal venous gas. There is no adenopathy. Reproductive: Hysterectomy.  No suspicious adnexal masses. Other: Midline vertical anterior pelvic  wall incisional scar. Small fat containing umbilical hernia. Musculoskeletal: Degenerative changes of the spine. No acute osseous pathology. IMPRESSION: 1. No acute intra-abdominal or pelvic pathology. 2. Mild sigmoid diverticulosis. No bowel obstruction. Normal appendix. Electronically Signed   By: Vanetta Chou M.D.   On: 04/10/2024 14:47     Procedures   Medications Ordered in the ED  oxyCODONE  (Oxy IR/ROXICODONE ) immediate release tablet 15 mg (has no administration in time range)  dicyclomine  (BENTYL ) capsule 10 mg (has no administration in time range)  iohexol  (OMNIPAQUE ) 350 MG/ML injection 75 mL (75 mLs Intravenous Contrast Given 04/10/24 1405)                                    Medical Decision Making  This patient presents to the ED for concern of abdominal pain, this involves an extensive number of treatment options, and is a complaint that carries with it a high risk of complications and morbidity.  The differential diagnosis for generalized abdominal pain includes, but is not limited to AAA, gastroenteritis, appendicitis, Bowel obstruction, Bowel perforation. Gastroparesis, DKA, Hernia, Inflammatory bowel disease, mesenteric ischemia, pancreatitis, peritonitis SBP, volvulus.   Co morbidities:  cocaine abuse chronic opiate use, multiple abdominal surgeries  Social Determinants of Health:   SDOH Screenings   Food Insecurity: No Food Insecurity (04/26/2023)  Housing: Low Risk  (04/26/2023)  Recent Concern: Housing - Medium Risk (03/15/2023)  Transportation Needs: Unmet Transportation Needs (04/26/2023)  Utilities: Not At Risk (04/26/2023)  Alcohol Screen: Low Risk  (04/26/2023)  Depression (PHQ2-9): Medium Risk (09/25/2023)  Financial Resource Strain: High Risk (04/26/2023)  Physical Activity: Insufficiently Active (04/26/2023)  Social Connections: Socially Isolated (04/26/2023)  Stress: Stress Concern Present (04/26/2023)  Tobacco Use: Low Risk  (04/10/2024)  Health  Literacy: Inadequate Health Literacy (04/26/2023)     Additional history:  {Additional history obtained from review of EMR Nursing triage which states the patient is feeling short of breath however she denies this at this time   Lab Tests:  I Ordered, and personally interpreted labs.  The pertinent results include:   Labs reviewed, no elevated white blood cell count, hemoglobin baseline for the patient, no other abnormal findings such as UTI on urinalysis  Imaging Studies:  I ordered imaging studies including CT abdomen and pelvis with contrast I independently  visualized and interpreted imaging which showed no acute findings, no evidence of obstruction I agree with the radiologist interpretation   Medicines ordered and prescription drug management:  I ordered medication including  Medications  oxyCODONE  (Oxy IR/ROXICODONE ) immediate release tablet 15 mg (has no administration in time range)  dicyclomine  (BENTYL ) capsule 10 mg (has no administration in time range)  iohexol  (OMNIPAQUE ) 350 MG/ML injection 75 mL (75 mLs Intravenous Contrast Given 04/10/24 1405)   for abdominal pain   Test Considered:   I considered a lactic acid level and CT angiogram of the abdomen however patient's abdomen abdominal exam is fairly benign she has no elevated white blood cell count.  She has generalized abdominal tenderness.  Have very low suspicion for acute mesenteric ischemia.  Patient is actively eating potato chips at this time  Critical Interventions:    Consultations Obtained:   Problem List / ED Course:  No diagnosis found.  MDM: Patient here with complaint of abdominal pain.  After review of all data points she does not appear to have any severe intra-abdominal pathology such as obstruction and acute diverticulitis volvulus intussusception.  Patient is actively eating ruffled potato chips in the bed and well-appearing.  She does have a history of chronic opiate use and states that  she has been more backed up lately and also had some relief when passing gas suspect this is likely more related to spasm in the gut from constipation.  Will discharge the patient at this time with additional Bentyl  and advised the patient she can try over-the-counter laxative such as [place]' milk of magnesia   Dispostion:  After consideration of the diagnostic results and the patients response to treatment, I feel that the patent would benefit from discharge with strict return precautions.      Final diagnoses:  None    ED Discharge Orders     None          Arloa Chroman, PA-C 04/10/24 1605    Tegeler, Lonni PARAS, MD 04/10/24 2337

## 2024-04-10 NOTE — ED Notes (Signed)
 Pt is eating candy and drinking water in lobby

## 2024-04-10 NOTE — ED Notes (Signed)
 Extra DG drawn

## 2024-04-10 NOTE — ED Triage Notes (Signed)
 Pt c/o lower abdominal pain that started 2 days ago, pain increasing, feels like her stomach is swollen. Pt states she is taking steroid for infection underneath her left breast, sore noted to left breast.

## 2024-04-12 NOTE — Progress Notes (Signed)
 Liver tests still high , stop Leflunamide. Repeat labs in 2 weeks.

## 2024-04-15 ENCOUNTER — Encounter (INDEPENDENT_AMBULATORY_CARE_PROVIDER_SITE_OTHER): Payer: Self-pay | Admitting: Primary Care

## 2024-04-15 ENCOUNTER — Ambulatory Visit (INDEPENDENT_AMBULATORY_CARE_PROVIDER_SITE_OTHER): Admitting: Primary Care

## 2024-04-15 VITALS — BP 131/90 | HR 93 | Resp 16 | Wt 209.0 lb

## 2024-04-15 DIAGNOSIS — R21 Rash and other nonspecific skin eruption: Secondary | ICD-10-CM

## 2024-04-15 DIAGNOSIS — I10 Essential (primary) hypertension: Secondary | ICD-10-CM

## 2024-04-15 DIAGNOSIS — N912 Amenorrhea, unspecified: Secondary | ICD-10-CM

## 2024-04-15 DIAGNOSIS — E785 Hyperlipidemia, unspecified: Secondary | ICD-10-CM

## 2024-04-15 DIAGNOSIS — L732 Hidradenitis suppurativa: Secondary | ICD-10-CM

## 2024-04-15 DIAGNOSIS — E876 Hypokalemia: Secondary | ICD-10-CM

## 2024-04-15 MED ORDER — FERROUS SULFATE 325 (65 FE) MG PO TABS: 325.0000 mg | ORAL_TABLET | Freq: Every day | ORAL | 1 refills | Status: AC

## 2024-04-15 MED ORDER — DOXYCYCLINE HYCLATE 100 MG PO TABS: 100.0000 mg | ORAL_TABLET | Freq: Two times a day (BID) | ORAL | 0 refills | Status: DC

## 2024-04-15 MED ORDER — FLUCONAZOLE 150 MG PO TABS: 150.0000 mg | ORAL_TABLET | Freq: Every day | ORAL | 1 refills | Status: AC

## 2024-04-15 MED ORDER — POTASSIUM CHLORIDE CRYS ER 10 MEQ PO TBCR: 10.0000 meq | EXTENDED_RELEASE_TABLET | Freq: Two times a day (BID) | ORAL | 1 refills | Status: DC

## 2024-04-15 NOTE — Patient Instructions (Addendum)
 Hidradenitis Suppurativa Hidradenitis suppurativa is a long-term (chronic) skin disease. It is similar to a severe form of acne, but it affects areas of the body where acne would be unusual, especially areas of the body where skin rubs against skin and becomes moist. These include: Underarms. Groin. Genital area. Buttocks. Upper thighs. Breasts. Hidradenitis suppurativa may start out as small lumps or pimples caused by blocked skin pores, sweat glands, or hair follicles. Pimples may develop into deep sores that break open (rupture) and drain pus. Over time, affected areas of skin may thicken and become scarred. This condition is rare and does not spread from person to person (non-contagious). What are the causes? The exact cause of this condition is not known. It may be related to: Female and female hormones. An overactive disease-fighting system (immune system). The immune system may over-react to blocked hair follicles or sweat glands and cause swelling and pus-filled sores. What increases the risk? You are more likely to develop this condition if you: Are female. Are 6-34 years old. Have a family history of hidradenitis suppurativa. Have a personal history of acne. Are overweight. Smoke. Take the medicine lithium. What are the signs or symptoms? The first symptoms are usually painful bumps in the skin, similar to pimples. The condition may get worse over time (progress), or it may only cause mild symptoms. If the disease progresses, symptoms may include: Skin bumps getting bigger and growing deeper into the skin. Bumps rupturing and draining pus. Itchy, infected skin. Skin getting thicker and scarred. Tunnels under the skin (fistulas) where pus drains from a bump. Pain during daily activities, such as pain during walking if your groin area is affected. Emotional problems, such as stress or depression. This condition may affect your appearance and your ability or willingness to wear  certain clothes or do certain activities. How is this diagnosed? This condition is diagnosed by a health care provider who specializes in skin conditions (dermatologist). You may be diagnosed based on: Your symptoms and medical history. A physical exam. Testing a pus sample for infection. Blood tests. How is this treated? Your treatment will depend on how severe your symptoms are. The same treatment will not work for everybody with this condition. You may need to try several treatments to find what works best for you. Treatment may include: Cleaning and bandaging (dressing) your wounds as needed. Lifestyle changes, such as new skin care routines. Taking medicines, such as: Antibiotics. Acne medicines. Medicines to reduce the activity of the immune system. A diabetes medicine (metformin). Birth control pills, for women. Steroids to reduce swelling and pain. Working with a mental health care provider, if you experience emotional distress due to this condition. If you have severe symptoms that do not get better with medicine, you may need surgery. Surgery may involve: Using a laser to clear the skin and remove hair follicles. Opening and draining deep sores. Removing the areas of skin that are diseased and scarred. Follow these instructions at home: Medicines  Take over-the-counter and prescription medicines only as told by your health care provider. If you were prescribed antibiotics, take them as told by your health care provider. Do not stop using the antibiotic even if your condition improves. Skin care If you have open wounds, cover them with a clean dressing as told by your health care provider. Keep wounds clean by washing them gently with soap and water when you bathe. Do not shave the areas where you get hidradenitis suppurativa. Wear loose-fitting clothes. Try to avoid  getting overheated or sweaty. If you get sweaty or wet, change into clean, dry clothes as soon as you can. To  help relieve pain and itchiness, cover sore areas with a warm, clean washcloth (warm compress) for 5-10 minutes as often as needed. Your healthcare provider may recommend an antiperspirant deodorant that may be gentle on your skin. A daily antiseptic wash to cleanse affected areas may be suggested by your healthcare provider. General instructions Learn as much as you can about your disease so that you have an active role in your treatment. Work closely with your health care provider to find treatments that work for you. If you are overweight, work with your health care provider to lose weight as recommended. Do not use any products that contain nicotine or tobacco. These products include cigarettes, chewing tobacco, and vaping devices, such as e-cigarettes. If you need help quitting, ask your health care provider. If you struggle with living with this condition, talk with your health care provider or work with a mental health care provider as recommended. Keep all follow-up visits. Where to find more information Hidradenitis Suppurativa Foundation, Inc.: www.hs-foundation.org American Academy [employer] Dermatology: infoexam.si Contact a health care provider if: You have a flare-up of hidradenitis suppurativa. You have a fever or chills. You have trouble controlling your symptoms at home. You have trouble doing your daily activities because of your symptoms. You have trouble dealing with emotional problems related to your condition. Summary Hidradenitis suppurativa is a long-term (chronic) skin disease. It is similar to a severe form of acne, but it affects areas of the body where acne would be unusual. The first symptoms are usually painful bumps in the skin, similar to pimples. The condition may only cause mild symptoms, or it may get worse over time (progress). If you have open wounds, cover them with a clean dressing as told by your health care provider. Keep wounds clean by washing them gently with  soap and water when you bathe. Besides skin care, treatment may include medicines, laser treatment, and surgery. This information is not intended to replace advice given to you by your health care provider. Make sure you discuss any questions you have with your health care provider. Document Revised: 07/28/2021 Document Reviewed: 07/28/2021 Elsevier Patient Education  2024 [employer] Bleach baths are a topical treatment that may be used to manage the symptoms of hidradenitis suppurativa (HS), a chronic skin condition characterized by painful, recurrent abscesses and fistulas.  How Bleach Baths Work  [employer] holdings contain sodium hypochlorite, a disinfectant that can kill bacteria and reduce inflammation. When taken as a bath, bleach can help: Reduce odor, Improve skin condition, Decrease inflammation, and Soften and exfoliate skin.  Benefits of Bleach Baths for HS May reduce the number and severity of abscesses, Improve skin odor, Provide temporary relief from inflammation and discomfort, and Promote healing. ( 1/4 cup in tube of water no more than 3 times a week)

## 2024-04-15 NOTE — Progress Notes (Signed)
 [facility]  Heather Perry, is a 58 y.o. female  RDW:[id number]  FMW:[id number]  DOB - 02/11/1966  Rash under breasts abdomen and pubic area (Hidradenitis Suppurativa) ( Subjective:   Heather Perry is a 58 y.o. female here today for an acute visit.  She is here today for bumps that appeared under her breasts groin area and some have ruptured.  Discussed with her diagnosis and treatment.  She also went to the emergency room on chin 20-25 for abdominal pain she stated it was worse after eating when she was on her feet or walking no pain elicited when sitting down or resting.  She denied any shortness of breath chest pain vomiting diarrhea back pain or dysuria.  She her rheumatologist on 03/28/2024 for Sjogren's syndrome without extraglandular involvement and treated with prednisone  also noted rash x 4 months.  Patient has a history of cocaine abuse and has not used any illicit drugs in over 5 years.  Also would not like that to be reflected in her chart review.  No problems updated.  Comprehensive ROS Pertinent positive and negative noted in HPI   Allergies  Allergen Reactions   Hydrocodone -Acetaminophen  Itching    Started itching.  Pt states that she can take this medication Other reaction(s): Other (See Comments) Pruritus   Lortab [Hydrocodone -Acetaminophen ]     Started itching   Niacin Hives   Penicillin G Hives and Nausea And Vomiting    Did it involve swelling of the face/tongue/throat, SOB, or low BP? No Did it involve sudden or severe rash/hives, skin peeling, or any reaction on the inside of your mouth or nose? Yes Did you need to seek medical attention at a hospital or doctor's office? No When did it last happen?    Over 20 years Ago   If all above answers are NO, may proceed with cephalosporin use.     Penicillins Itching    Past Medical History:  Diagnosis Date   Allergy    Anxiety    Arthritis    Asthma    Blood transfusion without reported  diagnosis    Depression    GERD (gastroesophageal reflux disease)    High cholesterol    Hypertension    Obesity    Osteoporosis    Recurrent boils    Seizures (HCC)    last seizure 3 years ago per pt 06-16-16 KBW   Sleep apnea    no CPAP used   Uterine fibroid     Current Outpatient Medications on File Prior to Visit  Medication Sig Dispense Refill   amLODipine  (NORVASC ) 5 MG tablet TAKE 1 TABLET (5 MG TOTAL) BY MOUTH DAILY. (AM) 90 tablet 0   aspirin  EC 81 MG tablet Take 81 mg by mouth daily.     atorvastatin  (LIPITOR) 20 MG tablet TAKE ONE TABLET BY MOUTH ONCE DAILY FOR CHOLESTEROL 90 tablet 1   budesonide -formoterol  (SYMBICORT ) 160-4.5 MCG/ACT inhaler Inhale 2 puffs into the lungs 2 (two) times daily. 1 each 1   celecoxib  (CELEBREX ) 100 MG capsule TAKE 1 CAPSULE BY MOUTH 2 (TWO) TIMES DAILY. (AM+PM) 180 capsule 1   cetirizine  (ZYRTEC ) 10 MG tablet TAKE ONE TABLET BY MOUTH ONCE DAILY FOR ALLERGIES (AM) 90 tablet 1   dicyclomine  (BENTYL ) 20 MG tablet Take 1 tablet (20 mg total) by mouth 2 (two) times daily. 20 tablet 0   DULoxetine  (CYMBALTA ) 30 MG capsule TAKE 1 CAPSULE (30 MG TOTAL) BY MOUTH DAILY. (BEDTIME) 90 capsule 0   esomeprazole  (NEXIUM ) 40  MG capsule TAKE ONE CAPSULE AS NEEDED FOR INDIGESTION (Patient taking differently: Take 40 mg by mouth in the morning and at bedtime.) 90 capsule 1   fluticasone  (FLONASE ) 50 MCG/ACT nasal spray PLACE 2 SPRAYS INTO BOTH NOSTRILS DAILY. 16 g 0   gabapentin  (NEURONTIN ) 300 MG capsule Take 300 mg by mouth 3 (three) times daily.     hydrochlorothiazide  (HYDRODIURIL ) 25 MG tablet TAKE 1 TABLET (25 MG TOTAL) BY MOUTH DAILY. (AM) 90 tablet 0   leflunomide (ARAVA) 20 MG tablet Take 20 mg by mouth daily.     methocarbamol  (ROBAXIN ) 500 MG tablet TAKE 1 TABLET (500 MG TOTAL) BY MOUTH EVERY 8 (EIGHT) HOURS AS NEEDED FOR MUSCLE SPASMS. 120 tablet 1   oxyCODONE  (ROXICODONE ) 15 MG immediate release tablet Take 15 mg by mouth 4 (four) times daily as  needed.     predniSONE  (DELTASONE ) 20 MG tablet Take 2 tablets (40 mg total) by mouth daily. 10 tablet 0   RESTASIS 0.05 % ophthalmic emulsion Place 1 drop into both eyes 2 (two) times daily.     sucralfate  (CARAFATE ) 1 GM/10ML suspension TAKE 10 MLS (1 G TOTAL) BY MOUTH WITH BREAKFAST, WITH LUNCH, AND WITH EVENING MEAL. (Patient not taking: Reported on 11/21/2023) 420 mL 5   traZODone  (DESYREL ) 50 MG tablet Take 50 mg by mouth at bedtime as needed for sleep.     triamcinolone  cream (KENALOG ) 0.1 % APPLY 1 APPLICATION TOPICALLY 3 (THREE) TIMES DAILY. 454 g 0   VENTOLIN  HFA 108 (90 Base) MCG/ACT inhaler INHALE 2 PUFFS BY MOUTH EVERY 6 HOURS AS NEEDED FOR SHORTNESS OF BREATH OR WHEEZING. 18 g 2   No current facility-administered medications on file prior to visit.   Health Maintenance  Topic Date Due   COVID-19 Vaccine (1) Never done   Zoster (Shingles) Vaccine (1 of 2) Never done   Pneumococcal Vaccine for age over 22 (2 of 2 - PCV) 12/24/2014   Flu Shot  01/19/2024   Breast Cancer Screening  05/09/2024   Colon Cancer Screening  06/30/2026   DTaP/Tdap/Td vaccine (2 - Td or Tdap) 01/12/2027   Pap with HPV screening  09/28/2027   Hepatitis C Screening  Completed   HIV Screening  Completed   Hepatitis B Vaccine  Aged Out   HPV Vaccine  Aged Out   Meningitis B Vaccine  Aged Out    Objective:    Physical Exam Vitals reviewed.  Constitutional:      Appearance: Normal appearance. She is obese.  HENT:     Head: Normocephalic.     Right Ear: Tympanic membrane, ear canal and external ear normal.     Left Ear: Tympanic membrane, ear canal and external ear normal.     Nose: Nose normal.     Mouth/Throat:     Mouth: Mucous membranes are moist.  Eyes:     Extraocular Movements: Extraocular movements intact.     Pupils: Pupils are equal, round, and reactive to light.  Cardiovascular:     Rate and Rhythm: Normal rate.  Pulmonary:     Effort: Pulmonary effort is normal.     Breath sounds:  Normal breath sounds.  Abdominal:     General: Bowel sounds are normal.     Palpations: Abdomen is soft.  Musculoskeletal:        General: Normal range of motion.     Cervical back: Normal range of motion.  Skin:    General: Skin is warm and dry.  Neurological:  Mental Status: She is alert and oriented to person, place, and time.  Psychiatric:        Mood and Affect: Mood normal.        Behavior: Behavior normal.        Thought Content: Thought content normal.       Assessment & Plan   There are no diagnoses linked to this encounter.   Patient have been counseled extensively about nutrition and exercise. Other issues discussed during this visit include: low cholesterol diet, weight control and daily exercise, foot care, annual eye examinations at Ophthalmology, importance of adherence with medications and regular follow-up. We also discussed long term complications of uncontrolled diabetes and hypertension.   No follow-ups on file.  The patient was given clear instructions to go to ER or return to medical center if symptoms don't improve, worsen or new problems develop. The patient verbalized understanding. The patient was told to call to get lab results if they haven't heard anything in the next week.   This note has been created with Education officer, environmental. Any transcriptional errors are unintentional.   Rosaline SHAUNNA Bohr, NP 04/15/2024, 8:40 AM

## 2024-04-16 LAB — SPECIMEN STATUS

## 2024-04-22 ENCOUNTER — Ambulatory Visit (INDEPENDENT_AMBULATORY_CARE_PROVIDER_SITE_OTHER): Payer: Self-pay | Admitting: Primary Care

## 2024-04-23 ENCOUNTER — Telehealth (INDEPENDENT_AMBULATORY_CARE_PROVIDER_SITE_OTHER): Payer: Self-pay | Admitting: Primary Care

## 2024-04-23 NOTE — Telephone Encounter (Signed)
 Looked in pt chart and pt was seen at the ED on 04/10/24. Pt had labs done and provider reviewed labs during visit and blood count was elevated and potassium was low so that is the reason for the medication  Returned pt call. Pt didn't answer lvm   If pt calls back please inform her

## 2024-04-23 NOTE — Telephone Encounter (Signed)
 Copied from CRM #[id number]. Topic: Clinical - Lab/Test Results >> Apr 22, 2024  5:27 PM Sophia H wrote: Reason for CRM: Returning call about labs - Advised per chart note Your result is normal.. This includes your cholesterol your anemia B12. Patient has questions specifically about her liver. No answer at [facility] since after 5p [phone]  Patient would like to know why she was prescribed potassium and iron if results are normal. Patient would like a callback before 9am because she goes to work at 10a.

## 2024-04-24 LAB — IRON,TIBC AND FERRITIN PANEL
Ferritin: 28 ng/mL (ref 15–150)
Iron Saturation: 16 % (ref 15–55)
Iron: 61 ug/dL (ref 27–159)
Total Iron Binding Capacity: 388 ug/dL (ref 250–450)
UIBC: 327 ug/dL (ref 131–425)

## 2024-04-24 LAB — LIPID PANEL
Chol/HDL Ratio: 1.6 ratio (ref 0.0–4.4)
Cholesterol, Total: 185 mg/dL (ref 100–199)
HDL: 117 mg/dL (ref >39)
LDL Chol Calc (NIH): 54 mg/dL (ref 0–99)
Triglycerides: 77 mg/dL (ref 0–149)
VLDL Cholesterol Cal: 14 mg/dL (ref 5–40)

## 2024-04-24 LAB — SPECIMEN STATUS REPORT

## 2024-04-24 LAB — B12 AND FOLATE PANEL
Folate: 7.4 ng/mL (ref >3.0)
Vitamin B-12: 1227 pg/mL (ref 232–1245)

## 2024-04-30 ENCOUNTER — Telehealth: Payer: Self-pay

## 2024-04-30 NOTE — Telephone Encounter (Signed)
 PCS request received from Greig Albee, RN/[facility].  I called the patient and she confirmed that she is in need of assistance with personal care/ ADLs.  Signed PCS request efaxed to [facility]: [phone]

## 2024-05-02 NOTE — Telephone Encounter (Signed)
 Copied from CRM [id number]. Topic: Referral - Status >> May 02, 2024  4:36 PM Viola F wrote: Reason for CRM: Shatara from [facility] care called to let Rosaline Bohr know that patient refused referral for personal care servies, she would prefer that her daughter takes care of her and that's not covered. Her call back number is needed is [phone]

## 2024-05-03 NOTE — Telephone Encounter (Signed)
 Noted. Will wait for a response from [facility]

## 2024-05-06 ENCOUNTER — Other Ambulatory Visit (INDEPENDENT_AMBULATORY_CARE_PROVIDER_SITE_OTHER): Payer: Self-pay | Admitting: Primary Care

## 2024-05-06 ENCOUNTER — Ambulatory Visit (INDEPENDENT_AMBULATORY_CARE_PROVIDER_SITE_OTHER): Admitting: Primary Care

## 2024-05-06 ENCOUNTER — Encounter (INDEPENDENT_AMBULATORY_CARE_PROVIDER_SITE_OTHER): Payer: Self-pay | Admitting: Primary Care

## 2024-05-06 VITALS — BP 164/80 | HR 89 | Resp 16 | Wt 207.8 lb

## 2024-05-06 DIAGNOSIS — J45909 Unspecified asthma, uncomplicated: Secondary | ICD-10-CM

## 2024-05-06 DIAGNOSIS — I10 Essential (primary) hypertension: Secondary | ICD-10-CM

## 2024-05-06 DIAGNOSIS — Z23 Encounter for immunization: Secondary | ICD-10-CM

## 2024-05-06 DIAGNOSIS — R21 Rash and other nonspecific skin eruption: Secondary | ICD-10-CM

## 2024-05-06 DIAGNOSIS — L732 Hidradenitis suppurativa: Secondary | ICD-10-CM | POA: Diagnosis not present

## 2024-05-06 MED ORDER — TRIAMCINOLONE ACETONIDE 0.1 % EX OINT: TOPICAL_OINTMENT | Freq: Three times a day (TID) | CUTANEOUS | 1 refills | Status: AC | PRN

## 2024-05-06 NOTE — Patient Instructions (Signed)

## 2024-05-06 NOTE — Telephone Encounter (Signed)
 I spoke to Heather Perry and she explained that the patient only wants her daughter to provide her care.  Heather Perry said she explained to the patient  that through [facility] program, the family member cannot be paid to care for her and the patient said she understood but she didn't want anyone else providing care, so the referral was closed.

## 2024-05-06 NOTE — Progress Notes (Unsigned)
 [facility]  Heather Perry, is a 58 y.o. female  RDW:[id number]  FMW:[id number]  DOB - 1965/09/14  Chief Complaint  Patient presents with   Hidradenitis    3 week f/u       Subjective:   Heather Perry is a 58 y.o. female here today for a follow up visit. Patient has No headache, No chest pain, No abdominal pain - No Nausea, No new weakness tingling or numbness, No Cough - shortness of breath HPI  No problems updated.  Comprehensive ROS Pertinent positive and negative noted in HPI   Allergies  Allergen Reactions   Hydrocodone -Acetaminophen  Itching    Started itching.  Pt states that she can take this medication Other reaction(s): Other (See Comments) Pruritus   Lortab [Hydrocodone -Acetaminophen ]     Started itching   Niacin Hives   Penicillin G Hives and Nausea And Vomiting    Did it involve swelling of the face/tongue/throat, SOB, or low BP? No Did it involve sudden or severe rash/hives, skin peeling, or any reaction on the inside of your mouth or nose? Yes Did you need to seek medical attention at a hospital or doctor's office? No When did it last happen?    Over 20 years Ago   If all above answers are NO, may proceed with cephalosporin use.     Penicillins Itching    Past Medical History:  Diagnosis Date   Allergy    Anxiety    Arthritis    Asthma    Blood transfusion without reported diagnosis    Depression    GERD (gastroesophageal reflux disease)    High cholesterol    Hypertension    Obesity    Osteoporosis    Recurrent boils    Seizures (HCC)    last seizure 3 years ago per pt 06-16-16 KBW   Sleep apnea    no CPAP used   Uterine fibroid     Current Outpatient Medications on File Prior to Visit  Medication Sig Dispense Refill   amLODipine  (NORVASC ) 5 MG tablet TAKE 1 TABLET (5 MG TOTAL) BY MOUTH DAILY. (AM) 90 tablet 0   aspirin  EC 81 MG tablet Take 81 mg by mouth daily.     atorvastatin  (LIPITOR) 20 MG tablet TAKE ONE  TABLET BY MOUTH ONCE DAILY FOR CHOLESTEROL 90 tablet 1   budesonide -formoterol  (SYMBICORT ) 160-4.5 MCG/ACT inhaler Inhale 2 puffs into the lungs 2 (two) times daily. 1 each 1   celecoxib  (CELEBREX ) 100 MG capsule TAKE 1 CAPSULE BY MOUTH 2 (TWO) TIMES DAILY. (AM+PM) 180 capsule 1   cetirizine  (ZYRTEC ) 10 MG tablet TAKE ONE TABLET BY MOUTH ONCE DAILY FOR ALLERGIES (AM) 90 tablet 1   doxycycline  (VIBRA -TABS) 100 MG tablet Take 1 tablet (100 mg total) by mouth 2 (two) times daily. 14 tablet 0   DULoxetine  (CYMBALTA ) 30 MG capsule TAKE 1 CAPSULE (30 MG TOTAL) BY MOUTH DAILY. (BEDTIME) 90 capsule 0   esomeprazole  (NEXIUM ) 40 MG capsule TAKE ONE CAPSULE AS NEEDED FOR INDIGESTION (Patient taking differently: Take 40 mg by mouth in the morning and at bedtime.) 90 capsule 1   ferrous sulfate 325 (65 FE) MG tablet Take 1 tablet (325 mg total) by mouth daily with breakfast. 90 tablet 1   fluconazole (DIFLUCAN) 150 MG tablet Take 1 tablet (150 mg total) by mouth daily. 1 tablet 1   fluticasone  (FLONASE ) 50 MCG/ACT nasal spray PLACE 2 SPRAYS INTO BOTH NOSTRILS DAILY. 16 g 0   gabapentin  (NEURONTIN ) 300 MG capsule  Take 300 mg by mouth 3 (three) times daily.     hydrochlorothiazide  (HYDRODIURIL ) 25 MG tablet TAKE 1 TABLET (25 MG TOTAL) BY MOUTH DAILY. (AM) 90 tablet 0   leflunomide (ARAVA) 20 MG tablet Take 20 mg by mouth daily.     methocarbamol  (ROBAXIN ) 500 MG tablet TAKE 1 TABLET (500 MG TOTAL) BY MOUTH EVERY 8 (EIGHT) HOURS AS NEEDED FOR MUSCLE SPASMS. 120 tablet 1   oxyCODONE  (ROXICODONE ) 15 MG immediate release tablet Take 15 mg by mouth 4 (four) times daily as needed.     potassium chloride  (KLOR-CON  M) 10 MEQ tablet Take 1 tablet (10 mEq total) by mouth 2 (two) times daily. 90 tablet 1   predniSONE  (DELTASONE ) 20 MG tablet Take 2 tablets (40 mg total) by mouth daily. 10 tablet 0   RESTASIS 0.05 % ophthalmic emulsion Place 1 drop into both eyes 2 (two) times daily.     sucralfate  (CARAFATE ) 1 GM/10ML  suspension TAKE 10 MLS (1 G TOTAL) BY MOUTH WITH BREAKFAST, WITH LUNCH, AND WITH EVENING MEAL. (Patient not taking: Reported on 11/21/2023) 420 mL 5   traZODone  (DESYREL ) 50 MG tablet Take 50 mg by mouth at bedtime as needed for sleep.     triamcinolone  cream (KENALOG ) 0.1 % APPLY 1 APPLICATION TOPICALLY 3 (THREE) TIMES DAILY. 454 g 0   VENTOLIN  HFA 108 (90 Base) MCG/ACT inhaler INHALE 2 PUFFS BY MOUTH EVERY 6 HOURS AS NEEDED FOR SHORTNESS OF BREATH OR WHEEZING. 18 g 2   No current facility-administered medications on file prior to visit.   Health Maintenance  Topic Date Due   COVID-19 Vaccine (1) Never done   Zoster (Shingles) Vaccine (1 of 2) Never done   Pneumococcal Vaccine for age over 43 (2 of 2 - PCV) 12/24/2014   Flu Shot  01/19/2024   Breast Cancer Screening  05/09/2024   Colon Cancer Screening  06/30/2026   DTaP/Tdap/Td vaccine (2 - Td or Tdap) 01/12/2027   Pap with HPV screening  09/28/2027   Hepatitis C Screening  Completed   HIV Screening  Completed   Hepatitis B Vaccine  Aged Out   HPV Vaccine  Aged Out   Meningitis B Vaccine  Aged Out    Objective:   Vitals:   05/06/24 0934  BP: (!) 164/80  Pulse: 89  Resp: 16  SpO2: 95%  Weight: 207 lb 12.8 oz (94.3 kg)   BP Readings from Last 3 Encounters:  05/06/24 (!) 164/80  04/15/24 (!) 131/90  04/10/24 (!) 153/83      Physical Exam Vitals reviewed.  Constitutional:      Appearance: Normal appearance. She is obese.  HENT:     Head: Normocephalic.     Right Ear: Tympanic membrane, ear canal and external ear normal.     Left Ear: Tympanic membrane, ear canal and external ear normal.     Nose: Nose normal.     Mouth/Throat:     Mouth: Mucous membranes are moist.  Eyes:     Extraocular Movements: Extraocular movements intact.     Pupils: Pupils are equal, round, and reactive to light.  Cardiovascular:     Rate and Rhythm: Normal rate.  Pulmonary:     Effort: Pulmonary effort is normal.     Breath sounds: Normal  breath sounds.  Abdominal:     General: Bowel sounds are normal.     Palpations: Abdomen is soft.  Musculoskeletal:        General: Normal range of motion.  Cervical back: Normal range of motion.  Skin:    General: Skin is warm and dry.  Neurological:     Mental Status: She is alert and oriented to person, place, and time.  Psychiatric:        Mood and Affect: Mood normal.        Behavior: Behavior normal.        Thought Content: Thought content normal.       Assessment & Plan  There are no diagnoses linked to this encounter.   Patient have been counseled extensively about nutrition and exercise. Other issues discussed during this visit include: low cholesterol diet, weight control and daily exercise, foot care, annual eye examinations at Ophthalmology, importance of adherence with medications and regular follow-up. We also discussed long term complications of uncontrolled diabetes and hypertension.   No follow-ups on file.  The patient was given clear instructions to go to ER or return to medical center if symptoms don't improve, worsen or new problems develop. The patient verbalized understanding. The patient was told to call to get lab results if they haven't heard anything in the next week.   This note has been created with Education officer, environmental. Any transcriptional errors are unintentional.   Heather SHAUNNA Bohr, NP 05/06/2024, 9:53 AM

## 2024-05-06 NOTE — Telephone Encounter (Signed)
 Noted

## 2024-05-09 NOTE — Telephone Encounter (Signed)
 Requested Prescriptions  Pending Prescriptions Disp Refills   VENTOLIN  HFA 108 (90 Base) MCG/ACT inhaler [Pharmacy Med Name: VENTOLIN  HFA 108 (90 BASE) MCG/ACT INHALATION AEROSOL SOLUTION] 18 g 2    Sig: INHALE 2 PUFFS BY MOUTH EVERY 6 HOURS AS NEEDED FOR SHORTNESS OF BREATH OR WHEEZING.     Pulmonology:  Beta Agonists 2 Failed - 05/09/2024  9:49 AM      Failed - Last BP in normal range    BP Readings from Last 1 Encounters:  05/06/24 (!) 164/80         Passed - Last Heart Rate in normal range    Pulse Readings from Last 1 Encounters:  05/06/24 89         Passed - Valid encounter within last 12 months    Recent Outpatient Visits           3 days ago Rash and nonspecific skin eruption   [place] [facility] Celestia Rosaline SQUIBB, NP   3 weeks ago Hidradenitis suppurativa   [place] [facility] Celestia Rosaline SQUIBB, NP   7 months ago Pneumococcal vaccination declined   [place] [facility] Celestia Rosaline SQUIBB, NP   8 months ago Subacute cough   [place] [facility] Celestia Rosaline SQUIBB, NP   11 months ago Rash and nonspecific skin eruption   [place] [facility] Celestia Rosaline SQUIBB, NP               hydrochlorothiazide  (HYDRODIURIL ) 25 MG tablet [Pharmacy Med Name: HYDROCHLOROTHIAZIDE  25 MG ORAL TABLET] 90 tablet 1    Sig: TAKE 1 TABLET (25 MG TOTAL) BY MOUTH DAILY. (AM)     Cardiovascular: Diuretics - Thiazide Failed - 05/09/2024  9:49 AM      Failed - K in normal range and within 180 days    Potassium  Date Value Ref Range Status  04/10/2024 3.2 (L) 3.5 - 5.1 mmol/L Final         Failed - Last BP in normal range    BP Readings from Last 1 Encounters:  05/06/24 (!) 164/80         Passed - Cr in normal range and within 180 days    Creat  Date Value Ref Range Status  04/25/2016 0.72 0.50 - 1.05 mg/dL Final    Comment:      For patients > or = 60 years of age: The upper  reference limit for Creatinine is approximately 13% higher for people identified as African-American.      Creatinine, Ser  Date Value Ref Range Status  04/10/2024 0.70 0.44 - 1.00 mg/dL Final         Passed - Na in normal range and within 180 days    Sodium  Date Value Ref Range Status  04/10/2024 140 135 - 145 mmol/L Final  10/28/2022 137 134 - 144 mmol/L Final         Passed - Valid encounter within last 6 months    Recent Outpatient Visits           3 days ago Rash and nonspecific skin eruption   [place] [facility] Celestia Rosaline SQUIBB, NP   3 weeks ago Hidradenitis suppurativa   [place] [facility] Celestia Rosaline SQUIBB, NP   7 months ago Pneumococcal vaccination declined   [place] [facility] Celestia Rosaline SQUIBB, NP   8 months ago Subacute cough     [facility] Celestia Rosaline SQUIBB, NP   11 months ago Rash and nonspecific skin eruption   [place] [facility] Celestia Rosaline SQUIBB, NP

## 2024-05-14 ENCOUNTER — Ambulatory Visit: Payer: Self-pay

## 2024-05-14 NOTE — Telephone Encounter (Signed)
 FYI Only or Action Required?: Action required by provider: request for appointment.  Patient was last seen in primary care on 05/06/2024 by Celestia Rosaline SQUIBB, NP.  Called Nurse Triage reporting Toe Pain.  Symptoms began several days ago.  Interventions attempted: Nothing.  Symptoms are: unchanged.  Triage Disposition: See HCP Within 4 Hours (Or PCP Triage)  Patient/caregiver understands and will follow disposition?: No, wishes to speak with PCP  Copied from CRM #[id number]. Topic: Clinical - Red Word Triage >> May 14, 2024 11:06 AM Shanda MATSU wrote: Red Word that prompted transfer to Nurse Triage: Patient is reporting extreme pain on left big toe, patient stated it is a bunion. Patient also reporting other pain throughout her body, patient is req to speak directly with provider if possible. Reason for Disposition  [1] SEVERE pain (e.g., excruciating, unable to do any normal activities) AND [2] not improved after 2 hours of pain medicine  Answer Assessment - Initial Assessment Questions 1. ONSET: When did the pain start?      About 3 days 2. LOCATION: Where is the pain located?   (e.g., around nail, entire toe, at foot joint)      L great toe 3. PAIN: How bad is the pain?    (Scale 1-10; or mild, moderate, severe)     9 4. APPEARANCE: What does the toe look like? (e.g., redness, swelling, bruising, pallor)     Yes redness and swelling 5. CAUSE: What do you think is causing the toe pain?     bunion 6. OTHER SYMPTOMS: Do you have any other symptoms? (e.g., leg pain, rash, fever, numbness)     Denies  Denies gout hx. Offered appt today at [facility] clinic, pt refused, states that she only wants to see PCP. Also requesting that PCP call her. Pt states that I need this shaved off before I go back to work on Tuesday, tell Rosaline to call me, she has my number. Pt denies having a podiatrist.  Protocols used: Toe Pain-A-AH

## 2024-05-14 NOTE — Telephone Encounter (Signed)
 Please schedule pt an acute visit  Will forward to provider. Pt is requesting a call from provider

## 2024-05-20 ENCOUNTER — Telehealth: Payer: Self-pay

## 2024-05-20 ENCOUNTER — Ambulatory Visit (INDEPENDENT_AMBULATORY_CARE_PROVIDER_SITE_OTHER): Admitting: Primary Care

## 2024-05-20 ENCOUNTER — Encounter (INDEPENDENT_AMBULATORY_CARE_PROVIDER_SITE_OTHER): Payer: Self-pay | Admitting: Primary Care

## 2024-05-20 VITALS — BP 134/84 | HR 109 | Resp 16 | Wt 204.4 lb

## 2024-05-20 DIAGNOSIS — L84 Corns and callosities: Secondary | ICD-10-CM

## 2024-05-20 DIAGNOSIS — N6081 Other benign mammary dysplasias of right breast: Secondary | ICD-10-CM | POA: Diagnosis not present

## 2024-05-20 MED ORDER — SULFAMETHOXAZOLE-TRIMETHOPRIM 800-160 MG PO TABS: 1.0000 | ORAL_TABLET | Freq: Two times a day (BID) | ORAL | 0 refills | Status: DC

## 2024-05-20 NOTE — Telephone Encounter (Unsigned)
 Copied from CRM #[id number]. Topic: Clinical - Medication Question >> May 20, 2024  2:43 PM Olam RAMAN wrote: Reason for CRM: Pt is calling fore today appt stated she is waiting for antibiotics for her feet and breast that provider RX to her. Cb 3 [phone]

## 2024-05-20 NOTE — Progress Notes (Signed)
 [facility]  Heather Perry, is a 58 y.o. female  RDW:[id number]  FMW:[id number]  DOB - 1965-12-02  Chief Complaint  Patient presents with   Toe Pain    Left foot Knot on big toe        Subjective:   Heather Perry is a 58 y.o. female here today for an acute visit. She is sad today one of her best friends died found dead (drug user) C/o left foot sore big toe raid area see picture and mass (cyst on Breast)   HPI  No problems updated.  Comprehensive ROS Pertinent positive and negative noted in HPI   Allergies  Allergen Reactions   Hydrocodone -Acetaminophen  Itching    Started itching.  Pt states that she can take this medication Other reaction(s): Other (See Comments) Pruritus   Lortab [Hydrocodone -Acetaminophen ]     Started itching   Niacin Hives   Penicillin G Hives and Nausea And Vomiting    Did it involve swelling of the face/tongue/throat, SOB, or low BP? No Did it involve sudden or severe rash/hives, skin peeling, or any reaction on the inside of your mouth or nose? Yes Did you need to seek medical attention at a hospital or doctor's office? No When did it last happen?    Over 20 years Ago   If all above answers are NO, may proceed with cephalosporin use.     Penicillins Itching    Past Medical History:  Diagnosis Date   Allergy    Anxiety    Arthritis    Asthma    Blood transfusion without reported diagnosis    Depression    GERD (gastroesophageal reflux disease)    High cholesterol    Hypertension    Obesity    Osteoporosis    Recurrent boils    Seizures (HCC)    last seizure 3 years ago per pt 06-16-16 KBW   Sleep apnea    no CPAP used   Uterine fibroid     Current Outpatient Medications on File Prior to Visit  Medication Sig Dispense Refill   amLODipine  (NORVASC ) 5 MG tablet TAKE 1 TABLET (5 MG TOTAL) BY MOUTH DAILY. (AM) 90 tablet 0   aspirin  EC 81 MG tablet Take 81 mg by mouth daily.     atorvastatin  (LIPITOR) 20 MG  tablet TAKE ONE TABLET BY MOUTH ONCE DAILY FOR CHOLESTEROL 90 tablet 1   budesonide -formoterol  (SYMBICORT ) 160-4.5 MCG/ACT inhaler Inhale 2 puffs into the lungs 2 (two) times daily. 1 each 1   celecoxib  (CELEBREX ) 100 MG capsule TAKE 1 CAPSULE BY MOUTH 2 (TWO) TIMES DAILY. (AM+PM) 180 capsule 1   cetirizine  (ZYRTEC ) 10 MG tablet TAKE ONE TABLET BY MOUTH ONCE DAILY FOR ALLERGIES (AM) 90 tablet 1   doxycycline  (VIBRA -TABS) 100 MG tablet Take 1 tablet (100 mg total) by mouth 2 (two) times daily. 14 tablet 0   DULoxetine  (CYMBALTA ) 30 MG capsule TAKE 1 CAPSULE (30 MG TOTAL) BY MOUTH DAILY. (BEDTIME) 90 capsule 0   esomeprazole  (NEXIUM ) 40 MG capsule TAKE ONE CAPSULE AS NEEDED FOR INDIGESTION (Patient taking differently: Take 40 mg by mouth in the morning and at bedtime.) 90 capsule 1   ferrous sulfate  325 (65 FE) MG tablet Take 1 tablet (325 mg total) by mouth daily with breakfast. 90 tablet 1   fluconazole  (DIFLUCAN ) 150 MG tablet Take 1 tablet (150 mg total) by mouth daily. 1 tablet 1   fluticasone  (FLONASE ) 50 MCG/ACT nasal spray PLACE 2 SPRAYS INTO BOTH NOSTRILS DAILY.  16 g 0   gabapentin  (NEURONTIN ) 300 MG capsule Take 300 mg by mouth 3 (three) times daily.     hydrochlorothiazide  (HYDRODIURIL ) 25 MG tablet TAKE 1 TABLET (25 MG TOTAL) BY MOUTH DAILY. (AM) 90 tablet 1   leflunomide (ARAVA) 20 MG tablet Take 20 mg by mouth daily.     methocarbamol  (ROBAXIN ) 500 MG tablet TAKE 1 TABLET (500 MG TOTAL) BY MOUTH EVERY 8 (EIGHT) HOURS AS NEEDED FOR MUSCLE SPASMS. 120 tablet 1   oxyCODONE  (ROXICODONE ) 15 MG immediate release tablet Take 15 mg by mouth 4 (four) times daily as needed.     potassium chloride  (KLOR-CON  M) 10 MEQ tablet Take 1 tablet (10 mEq total) by mouth 2 (two) times daily. 90 tablet 1   predniSONE  (DELTASONE ) 20 MG tablet Take 2 tablets (40 mg total) by mouth daily. 10 tablet 0   RESTASIS 0.05 % ophthalmic emulsion Place 1 drop into both eyes 2 (two) times daily.     sucralfate  (CARAFATE )  1 GM/10ML suspension TAKE 10 MLS (1 G TOTAL) BY MOUTH WITH BREAKFAST, WITH LUNCH, AND WITH EVENING MEAL. (Patient not taking: Reported on 11/21/2023) 420 mL 5   traZODone  (DESYREL ) 50 MG tablet Take 50 mg by mouth at bedtime as needed for sleep.     triamcinolone  0.1% oint-Eucerin equivalent cream 1:1 mixture Apply topically 3 (three) times daily as needed for irritation, itching or rash. Clean and dry skin use ointment in affected areas 464 g 1   triamcinolone  cream (KENALOG ) 0.1 % APPLY 1 APPLICATION TOPICALLY 3 (THREE) TIMES DAILY. 454 g 0   VENTOLIN  HFA 108 (90 Base) MCG/ACT inhaler INHALE 2 PUFFS BY MOUTH EVERY 6 HOURS AS NEEDED FOR SHORTNESS OF BREATH OR WHEEZING. 18 g 2   No current facility-administered medications on file prior to visit.   Health Maintenance  Topic Date Due   COVID-19 Vaccine (1) Never done   Zoster (Shingles) Vaccine (1 of 2) Never done   Pneumococcal Vaccine for age over 36 (2 of 2 - PCV) 12/24/2014   Flu Shot  01/19/2024   Breast Cancer Screening  05/09/2024   Colon Cancer Screening  06/30/2026   DTaP/Tdap/Td vaccine (2 - Td or Tdap) 01/12/2027   Pap with HPV screening  09/28/2027   Hepatitis C Screening  Completed   HIV Screening  Completed   Hepatitis B Vaccine  Aged Out   HPV Vaccine  Aged Out   Meningitis B Vaccine  Aged Out    Objective:   Vitals:   05/20/24 0958  BP: 134/84  Pulse: (!) 109  Resp: 16  SpO2: 95%  Weight: 204 lb 6.4 oz (92.7 kg)   BP Readings from Last 3 Encounters:  05/20/24 134/84  05/06/24 (!) 164/80  04/15/24 (!) 131/90      Physical Exam Vitals reviewed.  Constitutional:      Appearance: Normal appearance. She is obese.  HENT:     Head: Normocephalic.     Right Ear: Tympanic membrane, ear canal and external ear normal.     Left Ear: Tympanic membrane, ear canal and external ear normal.     Nose: Nose normal.     Mouth/Throat:     Mouth: Mucous membranes are moist.  Eyes:     Extraocular Movements: Extraocular  movements intact.     Pupils: Pupils are equal, round, and reactive to light.  Cardiovascular:     Rate and Rhythm: Normal rate.  Pulmonary:     Effort: Pulmonary effort is  normal.     Breath sounds: Normal breath sounds.  Abdominal:     General: Bowel sounds are normal.     Palpations: Abdomen is soft.  Musculoskeletal:        General: Normal range of motion.     Cervical back: Normal range of motion.  Skin:    General: Skin is warm.     Findings: Erythema, lesion and rash present.  Neurological:     Mental Status: She is alert and oriented to person, place, and time.  Psychiatric:        Mood and Affect: Mood normal.        Behavior: Behavior normal.        Thought Content: Thought content normal.      Assessment & Plan  Anmarie was seen today for toe pain.  Diagnoses and all orders for this visit:  Cyst of skin of right breast  -     Anaerobic and Aerobic Culture  Pre-ulcerative corn or callous   Podiatry consult  Patient have been counseled extensively about nutrition and exercise. Other issues discussed during this visit include: low cholesterol diet, weight control and daily exercise, foot care, annual eye examinations at Ophthalmology, importance of adherence with medications and regular follow-up. We also discussed long term complications of uncontrolled diabetes and hypertension.    The patient was given clear instructions to go to ER or return to medical center if symptoms don't improve, worsen or new problems develop. The patient verbalized understanding. The patient was told to call to get lab results if they haven't heard anything in the next week.   This note has been created with Education officer, environmental. Any transcriptional errors are unintentional.   Heather SHAUNNA Bohr, NP 05/20/2024, 5:04 PM

## 2024-05-23 ENCOUNTER — Ambulatory Visit (INDEPENDENT_AMBULATORY_CARE_PROVIDER_SITE_OTHER): Payer: Self-pay | Admitting: Primary Care

## 2024-05-23 LAB — ANAEROBIC AND AEROBIC CULTURE

## 2024-05-27 ENCOUNTER — Ambulatory Visit: Admitting: Podiatry

## 2024-05-27 ENCOUNTER — Encounter: Payer: Self-pay | Admitting: Podiatry

## 2024-05-27 DIAGNOSIS — M7752 Other enthesopathy of left foot: Secondary | ICD-10-CM

## 2024-05-27 MED ORDER — TRIAMCINOLONE ACETONIDE 10 MG/ML IJ SUSP
10.0000 mg | Freq: Once | INTRAMUSCULAR | Status: AC
  Administered 2024-05-27: 10 mg via INTRA_ARTICULAR

## 2024-05-27 NOTE — Progress Notes (Signed)
 Subjective:   Patient ID: Heather Perry, female   DOB: 58 y.o.   MRN: [id number]   HPI Patient presents with pain around the first MPJ left fluid buildup around the joint on the medial side with a small lesion that occurred secondary to the swelling of the tissue.  Patient does not remember injury does not smoke likes to be active   Review of Systems  All other systems reviewed and are negative.       Objective:  Physical Exam Vitals and nursing note reviewed.  Constitutional:      Appearance: She is well-developed.  Pulmonary:     Effort: Pulmonary effort is normal.  Musculoskeletal:        General: Normal range of motion.  Skin:    General: Skin is warm.  Neurological:     Mental Status: She is alert.     Neurovascular status intact muscle strength found to be adequate range of motion adequate with inflammation fluid of the first MPJ of the left foot medial side with the slight possibility that there could be a small foreign body within the inflamed area.  Patient has good digital perfusion well-oriented x 3     Assessment:  Inflammatory capsulitis of the first MPJ left with possible small foreign body that she is not aware of     Plan:  H&P reviewed and at this point weight sterile prep and I went ahead and I injected the capsule medial 2 mg dexamethasone  Kenalog  5 mg Xylocaine .  I cannot rule out that there may have been a small bit of foreign body when I did trim the area and I flushed it out cleaned it and applied sterile dressing and if any issues were to occur patient is to let me know right away

## 2024-06-08 ENCOUNTER — Other Ambulatory Visit (INDEPENDENT_AMBULATORY_CARE_PROVIDER_SITE_OTHER): Payer: Self-pay | Admitting: Primary Care

## 2024-06-08 DIAGNOSIS — I10 Essential (primary) hypertension: Secondary | ICD-10-CM

## 2024-07-09 ENCOUNTER — Other Ambulatory Visit (INDEPENDENT_AMBULATORY_CARE_PROVIDER_SITE_OTHER): Payer: Self-pay | Admitting: Primary Care

## 2024-07-09 DIAGNOSIS — E876 Hypokalemia: Secondary | ICD-10-CM

## 2024-07-18 ENCOUNTER — Other Ambulatory Visit (INDEPENDENT_AMBULATORY_CARE_PROVIDER_SITE_OTHER): Payer: Self-pay | Admitting: Primary Care

## 2024-07-18 NOTE — Telephone Encounter (Signed)
 Will forward to provider

## 2024-07-22 ENCOUNTER — Emergency Department (HOSPITAL_COMMUNITY): Admission: EM | Admit: 2024-07-22 | Discharge: 2024-07-22 | Disposition: A | Discharge status: Home / Self Care

## 2024-07-22 ENCOUNTER — Encounter (HOSPITAL_COMMUNITY): Payer: Self-pay

## 2024-07-22 ENCOUNTER — Other Ambulatory Visit: Payer: Self-pay

## 2024-07-22 DIAGNOSIS — L02416 Cutaneous abscess of left lower limb: Secondary | ICD-10-CM | POA: Insufficient documentation

## 2024-07-22 DIAGNOSIS — L0291 Cutaneous abscess, unspecified: Secondary | ICD-10-CM

## 2024-07-22 DIAGNOSIS — Z79899 Other long term (current) drug therapy: Secondary | ICD-10-CM | POA: Insufficient documentation

## 2024-07-22 DIAGNOSIS — I10 Essential (primary) hypertension: Secondary | ICD-10-CM | POA: Insufficient documentation

## 2024-07-22 DIAGNOSIS — Z7982 Long term (current) use of aspirin: Secondary | ICD-10-CM | POA: Insufficient documentation

## 2024-07-22 MED ORDER — OXYCODONE HCL 5 MG PO TABS
15.0000 mg | ORAL_TABLET | Freq: Once | ORAL | Status: AC
  Administered 2024-07-22: 15 mg via ORAL
  Filled 2024-07-22: qty 3

## 2024-07-22 MED ORDER — DOXYCYCLINE HYCLATE 100 MG PO CAPS: 100.0000 mg | ORAL_CAPSULE | Freq: Two times a day (BID) | ORAL | 0 refills | Status: AC

## 2024-07-22 MED ORDER — LIDOCAINE-EPINEPHRINE (PF) 2 %-1:200000 IJ SOLN
10.0000 mL | Freq: Once | INTRAMUSCULAR | Status: AC
  Administered 2024-07-22: 10 mL
  Filled 2024-07-22: qty 20

## 2024-07-22 MED ORDER — DOXYCYCLINE HYCLATE 100 MG PO TABS
100.0000 mg | ORAL_TABLET | Freq: Once | ORAL | Status: AC
  Administered 2024-07-22: 100 mg via ORAL
  Filled 2024-07-22: qty 1

## 2024-07-22 MED ORDER — LIDOCAINE-EPINEPHRINE (PF) 2 %-1:200000 IJ SOLN
3.0000 mL | INTRAMUSCULAR | Status: DC
  Administered 2024-07-22: 3 mL

## 2024-07-22 NOTE — ED Provider Notes (Signed)
 " [facility] Provider Note   CSN: [id number] Arrival date & time: 07/22/24  1338     Patient presents with: No chief complaint on file.   Heather Perry is a 59 y.o. female.   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  Reports that this is been there for the better part of 1 week.  She states that she is having pain there.  Has been putting fat back on it.  She reports that it was not getting better so she came to the ER for further evaluation.  She denies any fevers or chills.  She states that she has had recurrent superficial abscesses on her abdominal wall which usually drain on their own.  She reports this felt similar initially but is not draining.  She came to the ER for further evaluation regarding this.        Prior to Admission medications  Medication Sig Start Date End Date Taking? Authorizing Provider  doxycycline  (VIBRAMYCIN ) 100 MG capsule Take 1 capsule (100 mg total) by mouth 2 (two) times daily. 07/22/24  Yes Ula Prentice SAUNDERS, MD  amLODipine  (NORVASC ) 5 MG tablet TAKE 1 TABLET (5 MG TOTAL) BY MOUTH DAILY. (AM) 06/10/24   Celestia Rosaline SQUIBB, NP  aspirin  EC 81 MG tablet Take 81 mg by mouth daily.    [provider]  atorvastatin  (LIPITOR) 20 MG tablet TAKE ONE TABLET BY MOUTH ONCE DAILY FOR CHOLESTEROL (BEDTIME) 06/10/24   Celestia Rosaline SQUIBB, NP  budesonide -formoterol  (SYMBICORT ) 160-4.5 MCG/ACT inhaler Inhale 2 puffs into the lungs 2 (two) times daily. 11/21/23   Hoy Nidia FALCON, PA-C  celecoxib  (CELEBREX ) 100 MG capsule TAKE 1 CAPSULE BY MOUTH 2 (TWO) TIMES DAILY. (AM+PM) 04/05/24   Celestia Rosaline SQUIBB, NP  cetirizine  (ZYRTEC ) 10 MG tablet TAKE ONE TABLET BY MOUTH ONCE DAILY FOR ALLERGIES (AM) 07/10/24   Celestia Rosaline SQUIBB, NP  DULoxetine  (CYMBALTA ) 30 MG  capsule TAKE 1 CAPSULE (30 MG TOTAL) BY MOUTH DAILY. (BEDTIME) 07/10/24   Celestia Rosaline SQUIBB, NP  esomeprazole  (NEXIUM ) 40 MG capsule TAKE ONE CAPSULE AS NEEDED FOR INDIGESTION Patient taking differently: Take 40 mg by mouth in the morning and at bedtime. 04/20/23   Celestia Rosaline SQUIBB, NP  ferrous sulfate  325 (65 FE) MG tablet Take 1 tablet (325 mg total) by mouth daily with breakfast. 04/15/24   Celestia Rosaline SQUIBB, NP  fluconazole  (DIFLUCAN ) 150 MG tablet Take 1 tablet (150 mg total) by mouth daily. 04/15/24   Celestia Rosaline SQUIBB, NP  fluticasone  (FLONASE ) 50 MCG/ACT nasal spray PLACE 2 SPRAYS INTO BOTH NOSTRILS DAILY. 06/09/23   Celestia Rosaline SQUIBB, NP  gabapentin  (NEURONTIN ) 300 MG capsule Take 300 mg by mouth 3 (three) times daily. 11/07/23   [provider]  hydrochlorothiazide  (HYDRODIURIL ) 25 MG tablet TAKE 1 TABLET (25 MG TOTAL) BY MOUTH DAILY. (AM) 05/09/24   Celestia Rosaline SQUIBB, NP  leflunomide (ARAVA) 20 MG tablet Take 20 mg by mouth daily.    [provider]  methocarbamol  (ROBAXIN ) 500 MG tablet TAKE 1 TABLET (500 MG TOTAL) BY MOUTH EVERY 8 (EIGHT) HOURS AS NEEDED FOR MUSCLE SPASMS. 03/01/24   Celestia Rosaline SQUIBB, NP  oxyCODONE  (ROXICODONE ) 15 MG immediate release tablet Take 15 mg by mouth 4 (four) times daily as needed.    [provider]  potassium chloride  (KLOR-CON )  10 MEQ tablet TAKE 1 TABLET (10 MEQ TOTAL) BY MOUTH 2 (TWO) TIMES DAILY (AM+PM) 07/10/24   Celestia Rosaline SQUIBB, NP  predniSONE  (DELTASONE ) 20 MG tablet Take 2 tablets (40 mg total) by mouth daily. 11/21/23   Hoy Nidia FALCON, PA-C  RESTASIS 0.05 % ophthalmic emulsion Place 1 drop into both eyes 2 (two) times daily. 11/07/23   [provider]  sucralfate  (CARAFATE ) 1 GM/10ML suspension TAKE 10 MLS (1 G TOTAL) BY MOUTH WITH BREAKFAST, WITH LUNCH, AND WITH EVENING MEAL. 06/22/23   Celestia Rosaline SQUIBB, NP  traZODone  (DESYREL ) 50 MG tablet Take 50 mg by mouth at bedtime as needed for sleep.     [provider]  triamcinolone  0.1% oint-Eucerin equivalent cream 1:1 mixture Apply topically 3 (three) times daily as needed for irritation, itching or rash. Clean and dry skin use ointment in affected areas 05/06/24   Celestia Rosaline SQUIBB, NP  triamcinolone  cream (KENALOG ) 0.1 % APPLY 1 APPLICATION TOPICALLY 3 (THREE) TIMES DAILY. 02/23/24   Celestia Rosaline SQUIBB, NP  triamcinolone  ointment (KENALOG ) 0.1 % APPLY TOPICALLY 3 (THREE) TIMES DAILY AS NEEDED FOR IRRITATION, ITCHING OR RASH. CLEAN AND DRY SKIN USE OINTMENT IN AFFECTED AREAS 06/10/24   Celestia Rosaline SQUIBB, NP  VENTOLIN  HFA 108 (90 Base) MCG/ACT inhaler INHALE 2 PUFFS BY MOUTH EVERY 6 HOURS AS NEEDED FOR SHORTNESS OF BREATH OR WHEEZING. 05/09/24   Celestia Rosaline SQUIBB, NP    Allergies: Hydrocodone -acetaminophen , Lortab [hydrocodone -acetaminophen ], Niacin, Penicillin g, and Penicillins    Review of Systems  Skin:  Positive for wound.  All other systems reviewed and are negative.   Updated Vital Signs BP 134/82 (BP Location: Right Arm)   Pulse (!) 102   Temp 98 F (36.7 C) (Oral)   Resp 18   SpO2 100%   Physical Exam Vitals and nursing note reviewed.   Gen: NAD Eyes: PERRL, EOMI HEENT: no oropharyngeal swelling Neck: trachea midline Resp: clear to auscultation bilaterally Card: RRR, no murmurs, rubs, or gallops Abd: nontender, nondistended Extremities: There is an indurated area over the left inner thigh measuring roughly 6 cm x 3 cm with an area of fluctuance in the center.  No crepitus.  No overlying erythema or warmth is noted Vascular: 2+ radial pulses bilaterally, 2+ DP pulses bilaterally Skin: no rashes Psyc: acting appropriately   (all labs ordered are listed, but only abnormal results are displayed) Labs Reviewed - No data to display  EKG: None  Radiology: No results found.   .Incision and Drainage  Date/Time: 07/22/2024 6:40 PM  Performed by: Ula Prentice SAUNDERS, MD Authorized by: Ula Prentice SAUNDERS, MD    Consent:    Consent obtained:  Verbal   Consent given by:  Patient   Risks discussed:  Bleeding, damage to other organs, infection, incomplete drainage and pain   Alternatives discussed:  No treatment Universal protocol:    Immediately prior to procedure, a time out was called: yes     Patient identity confirmed:  Verbally with patient Location:    Type:  Abscess   Size:  3   Location:  Lower extremity   Lower extremity location:  Leg   Leg location:  L upper leg Pre-procedure details:    Skin preparation:  Chlorhexidine  Anesthesia:    Anesthesia method:  Local infiltration   Local anesthetic:  3 mL lidocaine -EPINEPHrine  2 %-1:200000 Procedure type:    Complexity:  Simple Procedure details:    Ultrasound guidance: no     Needle aspiration: no  Incision types:  Single straight   Incision depth:  Dermal   Wound management:  Probed and deloculated and irrigated with saline   Drainage:  Purulent   Drainage amount:  Moderate   Wound treatment:  Wound left open   Packing materials:  None Post-procedure details:    Procedure completion:  Tolerated    Medications Ordered in the ED  doxycycline  (VIBRA -TABS) tablet 100 mg (has no administration in time range)  oxyCODONE  (Oxy IR/ROXICODONE ) immediate release tablet 15 mg (15 mg Oral Given 07/22/24 1824)  lidocaine -EPINEPHrine  (XYLOCAINE  W/EPI) 2 %-1:200000 (PF) injection 10 mL (10 mLs Infiltration Given 07/22/24 1830)                                    Medical Decision Making 59 year old female with past medical history of hypertension and hyperlipidemia presenting to the emergency department today with exam consistent with abscess.  Will drain this here in the emergency department.  The patient is on Roxicodone  as a chronic medication and has been here and has not had a dose since this morning.  Will give her a dose of this and drained her abscess after.  Her initial heart rate at triage was 102.  Her heart rate in the room is 92  on the monitor.  She denies any systemic symptoms and this does appear to be very well localized so suspicion for systemic infection is low at this time.  Do not think that labs are warranted.  The patient's abscess was drained.  She is feeling much better on reassessment.  She is discharged with return precautions.  Risk Prescription drug management.        Final diagnoses:  Abscess    ED Discharge Orders          Ordered    doxycycline  (VIBRAMYCIN ) 100 MG capsule  2 times daily        07/22/24 1839               Ula Prentice SAUNDERS, MD 07/22/24 [phone]  "

## 2024-07-22 NOTE — ED Notes (Signed)
 Sterile dressing was placed on pt; pt. Was sent home with extra dressings

## 2024-07-22 NOTE — ED Notes (Signed)
 Placed monitoring equipment on pt to obtain vital signs. After leaving room pt immediately pulled off all monitoring equipment. This NT went back into pts room to place the pt back on the monitor and asked her to please leave all equipment on.

## 2024-07-22 NOTE — ED Provider Triage Note (Signed)
 Emergency Medicine Provider Triage Evaluation Note  Heather Perry , a 59 y.o. female  was evaluated in triage.  Pt complains of abscess to left upper inner thigh. First noticed this about 1 week ago. She has been placing fat back meat on it. Denies using warm compresses. Has not noticed any drainage or bleeding. No recorded fevers. Also reports diarrhea x2 days.  Review of Systems  Positive: Abscess, diarrhea Negative: Fevers  Physical Exam  BP 130/88   Pulse (!) 101   Temp 98 F (36.7 C)   Resp 16   SpO2 95%  Gen:   Awake, no distress   Resp:  Normal effort  MSK:   Moves extremities without difficulty  Other:  About 1 cm in diameter area of induration to the left upper inner thigh without drainage or bleeding. Removed bandaging that contained piece of fat back meat.  Medical Decision Making  Medically screening exam initiated at 5:03 PM.  Appropriate orders placed.  Heather Perry was informed that the remainder of the evaluation will be completed by another provider, this initial triage assessment does not replace that evaluation, and the importance of remaining in the ED until their evaluation is complete.   Rosina Almarie LABOR, PA-C 07/22/24 1708

## 2024-07-22 NOTE — ED Notes (Signed)
 EDP at [facility]

## 2024-07-24 ENCOUNTER — Encounter (INDEPENDENT_AMBULATORY_CARE_PROVIDER_SITE_OTHER): Payer: Self-pay | Admitting: Primary Care

## 2024-07-24 ENCOUNTER — Ambulatory Visit (INDEPENDENT_AMBULATORY_CARE_PROVIDER_SITE_OTHER): Admitting: Primary Care

## 2024-07-24 VITALS — BP 150/85 | HR 98 | Temp 97.7°F | Resp 16 | Ht 62.0 in | Wt 215.2 lb

## 2024-07-24 DIAGNOSIS — Z23 Encounter for immunization: Secondary | ICD-10-CM

## 2024-07-24 NOTE — Patient Instructions (Addendum)
" ° °  Pneumococcal Conjugate Vaccine: What You Need to Know (CDC VIS) Many Vaccine Information Statements are available in Spanish and other languages. See swimconditioning.hu To view this CDC Vaccine Information Statement in English, click the link below or scan the QR code: https://pe.elsevier.com/sJ2OwaGU  This information is not intended to replace advice given to you by your health care provider. Make sure you discuss any questions you have with your health care provider. Vaccine Information Statements (VISs) are third - party content from [facility] & [employer] and Else[employer] is not responsible for the content.     [employer] Market researcher.     Elsevier Patient Education  2026 [employer]. "

## 2024-07-24 NOTE — Progress Notes (Unsigned)
 " [facility]  Heather Perry, is a 59 y.o. female  [id number]:[id number]  FMW:[id number]  DOB - 1966-06-20  No chief complaint on file.      Subjective:   Heather Perry is a 59 y.o. female here today for an acute visit.  HPI  No problems updated.  Comprehensive ROS Pertinent positive and negative noted in HPI   Allergies[1]  Past Medical History:  Diagnosis Date   Allergy    Anxiety    Arthritis    Asthma    Blood transfusion without reported diagnosis    Depression    GERD (gastroesophageal reflux disease)    High cholesterol    Hypertension    Obesity    Osteoporosis    Recurrent boils    Seizures (HCC)    last seizure 3 years ago per pt 06-16-16 KBW   Sleep apnea    no CPAP used   Uterine fibroid     Medications Ordered Prior to Encounter[2] Health Maintenance  Topic Date Due   COVID-19 Vaccine (1) Never done   Pneumococcal Vaccine for age over 58 (1 of 2 - PCV) Never done   Zoster (Shingles) Vaccine (1 of 2) Never done   Flu Shot  01/19/2024   Breast Cancer Screening  05/09/2024   Colon Cancer Screening  06/30/2026   DTaP/Tdap/Td vaccine (2 - Td or Tdap) 01/12/2027   Pap with HPV screening  09/28/2027   HPV Vaccine (No Doses Required) Completed   Hepatitis C Screening  Completed   HIV Screening  Completed   Hepatitis B Vaccine  Aged Out   Meningitis B Vaccine  Aged Out    Objective:    Physical Exam Vitals reviewed.  Constitutional:      Appearance: Normal appearance. She is obese.  HENT:     Head: Normocephalic.     Right Ear: Tympanic membrane, ear canal and external ear normal.     Left Ear: Tympanic membrane, ear canal and external ear normal.     Nose: Nose normal.     Mouth/Throat:     Mouth: Mucous membranes are moist.  Eyes:     Extraocular Movements: Extraocular movements intact.     Pupils: Pupils are equal, round, and reactive to light.  Cardiovascular:     Rate and Rhythm: Normal rate.  Pulmonary:     Effort:  Pulmonary effort is normal.     Breath sounds: Normal breath sounds.  Abdominal:     General: Bowel sounds are normal.     Palpations: Abdomen is soft.  Musculoskeletal:        General: Normal range of motion.     Cervical back: Normal range of motion.  Skin:    General: Skin is warm and dry.  Neurological:     Mental Status: She is alert and oriented to person, place, and time.  Psychiatric:        Mood and Affect: Mood normal.        Behavior: Behavior normal.        Thought Content: Thought content normal.       Assessment & Plan   There are no diagnoses linked to this encounter.   Patient have been counseled extensively about nutrition and exercise. Other issues discussed during this visit include: low cholesterol diet, weight control and daily exercise, foot care, annual eye examinations at Ophthalmology, importance of adherence with medications and regular follow-up. We also discussed long term complications of uncontrolled diabetes and hypertension.  No follow-ups on file.  The patient was given clear instructions to go to ER or return to medical center if symptoms don't improve, worsen or new problems develop. The patient verbalized understanding. The patient was told to call to get lab results if they haven't heard anything in the next week.   This note has been created with Education officer, environmental. Any transcriptional errors are unintentional.   Rosaline SHAUNNA Bohr, NP 07/24/2024, 9:47 AM     [1]  Allergies Allergen Reactions   Hydrocodone -Acetaminophen  Itching    Started itching.  Pt states that she can take this medication Other reaction(s): Other (See Comments) Pruritus   Lortab [Hydrocodone -Acetaminophen ]     Started itching   Niacin Hives   Penicillin G Hives and Nausea And Vomiting    Did it involve swelling of the face/tongue/throat, SOB, or low BP? No Did it involve sudden or severe rash/hives, skin peeling, or  any reaction on the inside of your mouth or nose? Yes Did you need to seek medical attention at a hospital or doctor's office? No When did it last happen?    Over 20 years Ago   If all above answers are NO, may proceed with cephalosporin use.     Penicillins Itching  [2]  Current Outpatient Medications on File Prior to Visit  Medication Sig Dispense Refill   amLODipine  (NORVASC ) 5 MG tablet TAKE 1 TABLET (5 MG TOTAL) BY MOUTH DAILY. (AM) 90 tablet 0   aspirin  EC 81 MG tablet Take 81 mg by mouth daily.     atorvastatin  (LIPITOR) 20 MG tablet TAKE ONE TABLET BY MOUTH ONCE DAILY FOR CHOLESTEROL (BEDTIME) 90 tablet 1   budesonide -formoterol  (SYMBICORT ) 160-4.5 MCG/ACT inhaler Inhale 2 puffs into the lungs 2 (two) times daily. 1 each 1   celecoxib  (CELEBREX ) 100 MG capsule TAKE 1 CAPSULE BY MOUTH 2 (TWO) TIMES DAILY. (AM+PM) 180 capsule 1   cetirizine  (ZYRTEC ) 10 MG tablet TAKE ONE TABLET BY MOUTH ONCE DAILY FOR ALLERGIES (AM) 90 tablet 1   doxycycline  (VIBRAMYCIN ) 100 MG capsule Take 1 capsule (100 mg total) by mouth 2 (two) times daily. 14 capsule 0   DULoxetine  (CYMBALTA ) 30 MG capsule TAKE 1 CAPSULE (30 MG TOTAL) BY MOUTH DAILY. (BEDTIME) 90 capsule 1   esomeprazole  (NEXIUM ) 40 MG capsule TAKE ONE CAPSULE AS NEEDED FOR INDIGESTION (Patient taking differently: Take 40 mg by mouth in the morning and at bedtime.) 90 capsule 1   ferrous sulfate  325 (65 FE) MG tablet Take 1 tablet (325 mg total) by mouth daily with breakfast. 90 tablet 1   fluconazole  (DIFLUCAN ) 150 MG tablet Take 1 tablet (150 mg total) by mouth daily. 1 tablet 1   fluticasone  (FLONASE ) 50 MCG/ACT nasal spray PLACE 2 SPRAYS INTO BOTH NOSTRILS DAILY. 16 g 0   gabapentin  (NEURONTIN ) 300 MG capsule Take 300 mg by mouth 3 (three) times daily.     hydrochlorothiazide  (HYDRODIURIL ) 25 MG tablet TAKE 1 TABLET (25 MG TOTAL) BY MOUTH DAILY. (AM) 90 tablet 1   leflunomide (ARAVA) 20 MG tablet Take 20 mg by mouth daily.     methocarbamol   (ROBAXIN ) 500 MG tablet TAKE 1 TABLET (500 MG TOTAL) BY MOUTH EVERY 8 (EIGHT) HOURS AS NEEDED FOR MUSCLE SPASMS. 120 tablet 1   oxyCODONE  (ROXICODONE ) 15 MG immediate release tablet Take 15 mg by mouth 4 (four) times daily as needed.     potassium chloride  (KLOR-CON ) 10 MEQ tablet TAKE 1 TABLET (10 MEQ  TOTAL) BY MOUTH 2 (TWO) TIMES DAILY (AM+PM) 90 tablet 1   predniSONE  (DELTASONE ) 20 MG tablet Take 2 tablets (40 mg total) by mouth daily. 10 tablet 0   RESTASIS 0.05 % ophthalmic emulsion Place 1 drop into both eyes 2 (two) times daily.     sucralfate  (CARAFATE ) 1 GM/10ML suspension TAKE 10 MLS (1 G TOTAL) BY MOUTH WITH BREAKFAST, WITH LUNCH, AND WITH EVENING MEAL. 420 mL 5   traZODone  (DESYREL ) 50 MG tablet Take 50 mg by mouth at bedtime as needed for sleep.     triamcinolone  0.1% oint-Eucerin equivalent cream 1:1 mixture Apply topically 3 (three) times daily as needed for irritation, itching or rash. Clean and dry skin use ointment in affected areas 464 g 1   triamcinolone  cream (KENALOG ) 0.1 % APPLY 1 APPLICATION TOPICALLY 3 (THREE) TIMES DAILY. 454 g 0   triamcinolone  ointment (KENALOG ) 0.1 % APPLY TOPICALLY 3 (THREE) TIMES DAILY AS NEEDED FOR IRRITATION, ITCHING OR RASH. CLEAN AND DRY SKIN USE OINTMENT IN AFFECTED AREAS 454 g 1   VENTOLIN  HFA 108 (90 Base) MCG/ACT inhaler INHALE 2 PUFFS BY MOUTH EVERY 6 HOURS AS NEEDED FOR SHORTNESS OF BREATH OR WHEEZING. 18 g 2   No current facility-administered medications on file prior to visit.   "

## 2024-10-21 ENCOUNTER — Ambulatory Visit (INDEPENDENT_AMBULATORY_CARE_PROVIDER_SITE_OTHER): Payer: Self-pay | Admitting: Primary Care
